# Patient Record
Sex: Female | Born: 1980 | Race: Black or African American | Hispanic: No | Marital: Married | Smoking: Never smoker
Health system: Southern US, Community
[De-identification: ages and names within clinical notes are randomized; demographics above are authoritative.]

## PROBLEM LIST (undated history)

## (undated) DIAGNOSIS — G4700 Insomnia, unspecified: Secondary | ICD-10-CM

## (undated) DIAGNOSIS — N390 Urinary tract infection, site not specified: Secondary | ICD-10-CM

## (undated) DIAGNOSIS — K219 Gastro-esophageal reflux disease without esophagitis: Secondary | ICD-10-CM

## (undated) DIAGNOSIS — L309 Dermatitis, unspecified: Secondary | ICD-10-CM

## (undated) DIAGNOSIS — M419 Scoliosis, unspecified: Secondary | ICD-10-CM

## (undated) DIAGNOSIS — E785 Hyperlipidemia, unspecified: Secondary | ICD-10-CM

## (undated) DIAGNOSIS — IMO0001 Reserved for inherently not codable concepts without codable children: Secondary | ICD-10-CM

## (undated) DIAGNOSIS — Z973 Presence of spectacles and contact lenses: Secondary | ICD-10-CM

## (undated) DIAGNOSIS — L509 Urticaria, unspecified: Secondary | ICD-10-CM

## (undated) DIAGNOSIS — N760 Acute vaginitis: Secondary | ICD-10-CM

## (undated) DIAGNOSIS — F988 Other specified behavioral and emotional disorders with onset usually occurring in childhood and adolescence: Secondary | ICD-10-CM

## (undated) DIAGNOSIS — Z464 Encounter for fitting and adjustment of orthodontic device: Secondary | ICD-10-CM

## (undated) DIAGNOSIS — B9689 Other specified bacterial agents as the cause of diseases classified elsewhere: Secondary | ICD-10-CM

## (undated) HISTORY — DX: Insomnia, unspecified: G47.00

## (undated) HISTORY — PX: CERVIX LESION DESTRUCTION: SHX591

## (undated) HISTORY — DX: Urticaria, unspecified: L50.9

## (undated) HISTORY — DX: Dermatitis, unspecified: L30.9

## (undated) HISTORY — DX: Urinary tract infection, site not specified: N39.0

## (undated) HISTORY — DX: Acute vaginitis: N76.0

## (undated) HISTORY — DX: Other specified bacterial agents as the cause of diseases classified elsewhere: B96.89

## (undated) HISTORY — DX: Hyperlipidemia, unspecified: E78.5

## (undated) HISTORY — PX: DILATION AND CURETTAGE OF UTERUS: SHX78

---

## 2000-10-11 ENCOUNTER — Emergency Department (HOSPITAL_COMMUNITY): Admission: EM | Admit: 2000-10-11 | Discharge: 2000-10-11 | Payer: Self-pay | Admitting: Emergency Medicine

## 2002-01-19 ENCOUNTER — Other Ambulatory Visit: Admission: RE | Admit: 2002-01-19 | Discharge: 2002-01-19 | Payer: Self-pay | Admitting: Obstetrics and Gynecology

## 2002-09-07 ENCOUNTER — Emergency Department (HOSPITAL_COMMUNITY): Admission: EM | Admit: 2002-09-07 | Discharge: 2002-09-07 | Payer: Self-pay | Admitting: Emergency Medicine

## 2002-09-08 ENCOUNTER — Encounter: Payer: Self-pay | Admitting: Emergency Medicine

## 2002-09-08 ENCOUNTER — Ambulatory Visit (HOSPITAL_COMMUNITY): Admission: RE | Admit: 2002-09-08 | Discharge: 2002-09-08 | Payer: Self-pay | Admitting: Emergency Medicine

## 2003-12-02 ENCOUNTER — Emergency Department (HOSPITAL_COMMUNITY): Admission: AD | Admit: 2003-12-02 | Discharge: 2003-12-02 | Payer: Self-pay | Admitting: Internal Medicine

## 2003-12-02 IMAGING — CR DG CHEST 2V
2 series · 2 of 2 positions shown · non-contrast
Comparison: none

CLINICAL DATA: Chest pain.  
 CHEST (TWO VIEWS)
 No comparison.  
 The heart size and mediastinal contours are normal. The lungs are clear. The visualized skeleton is unremarkable.

 IMPRESSION
 No active disease.

[view not recorded (1 of 2)]
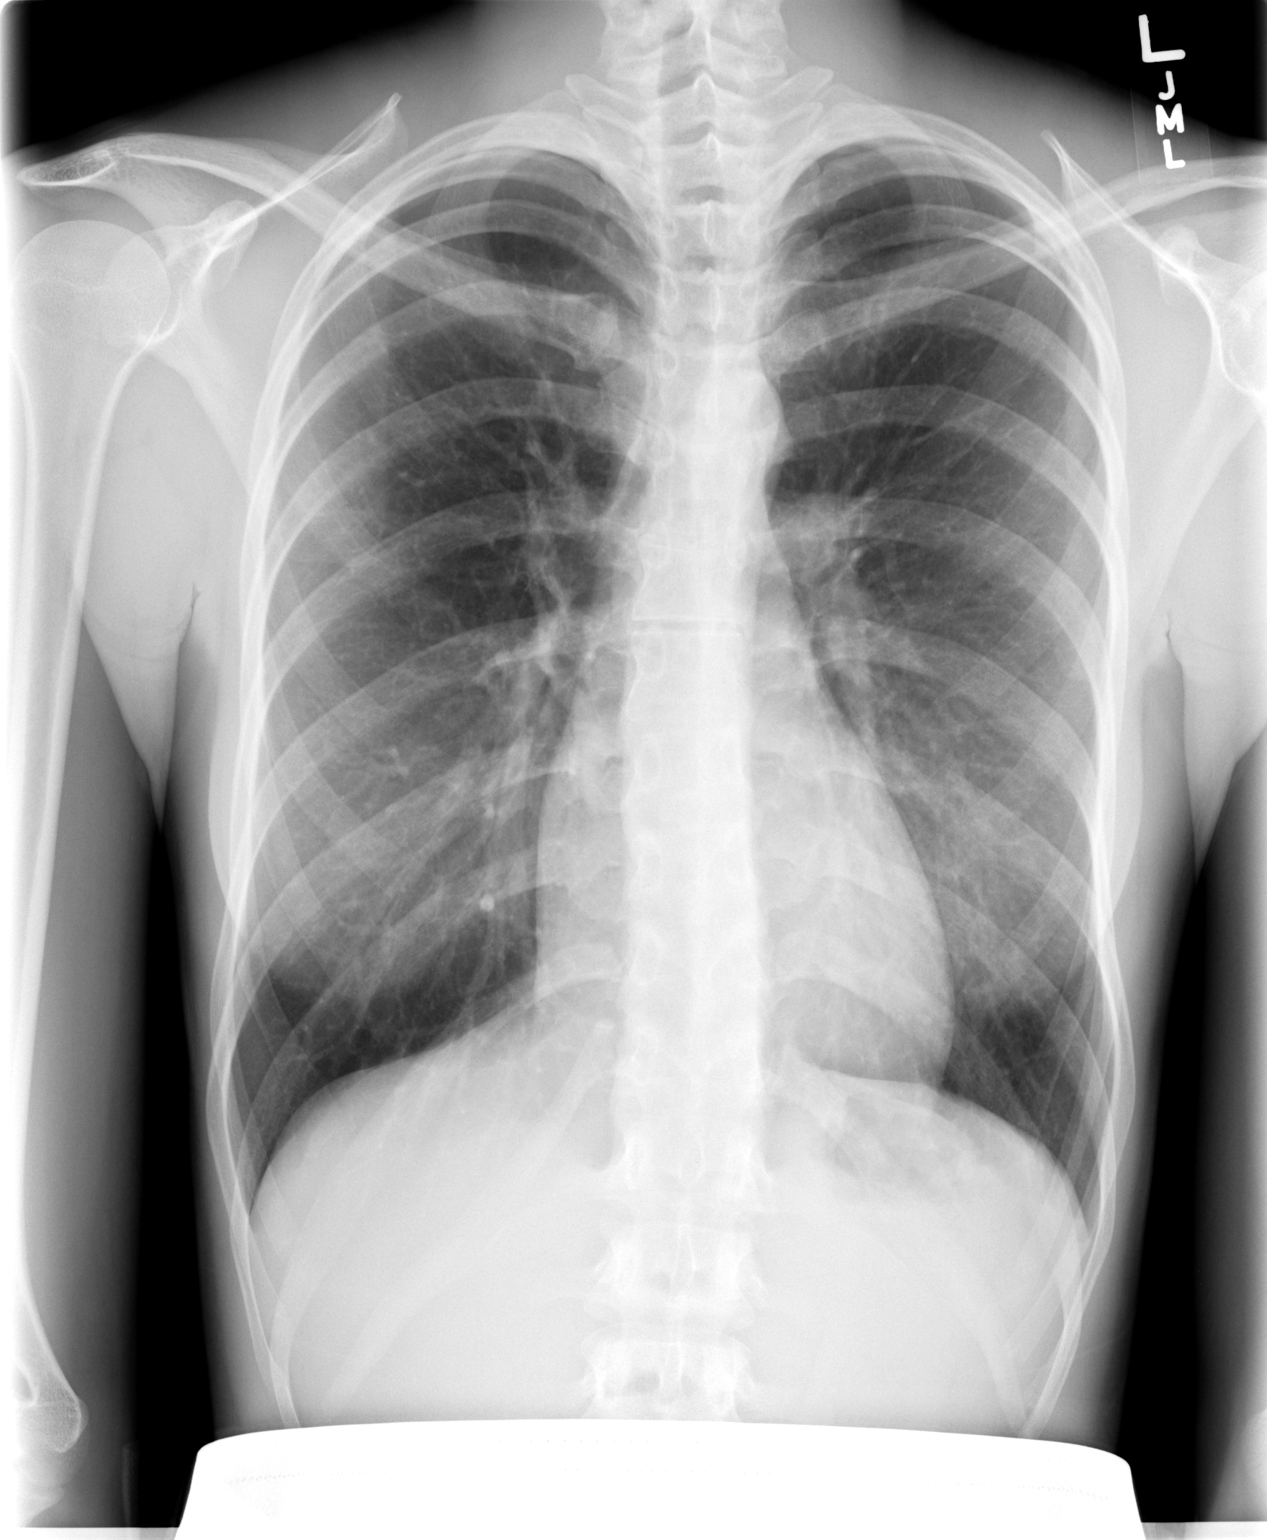

[view not recorded (2 of 2)]
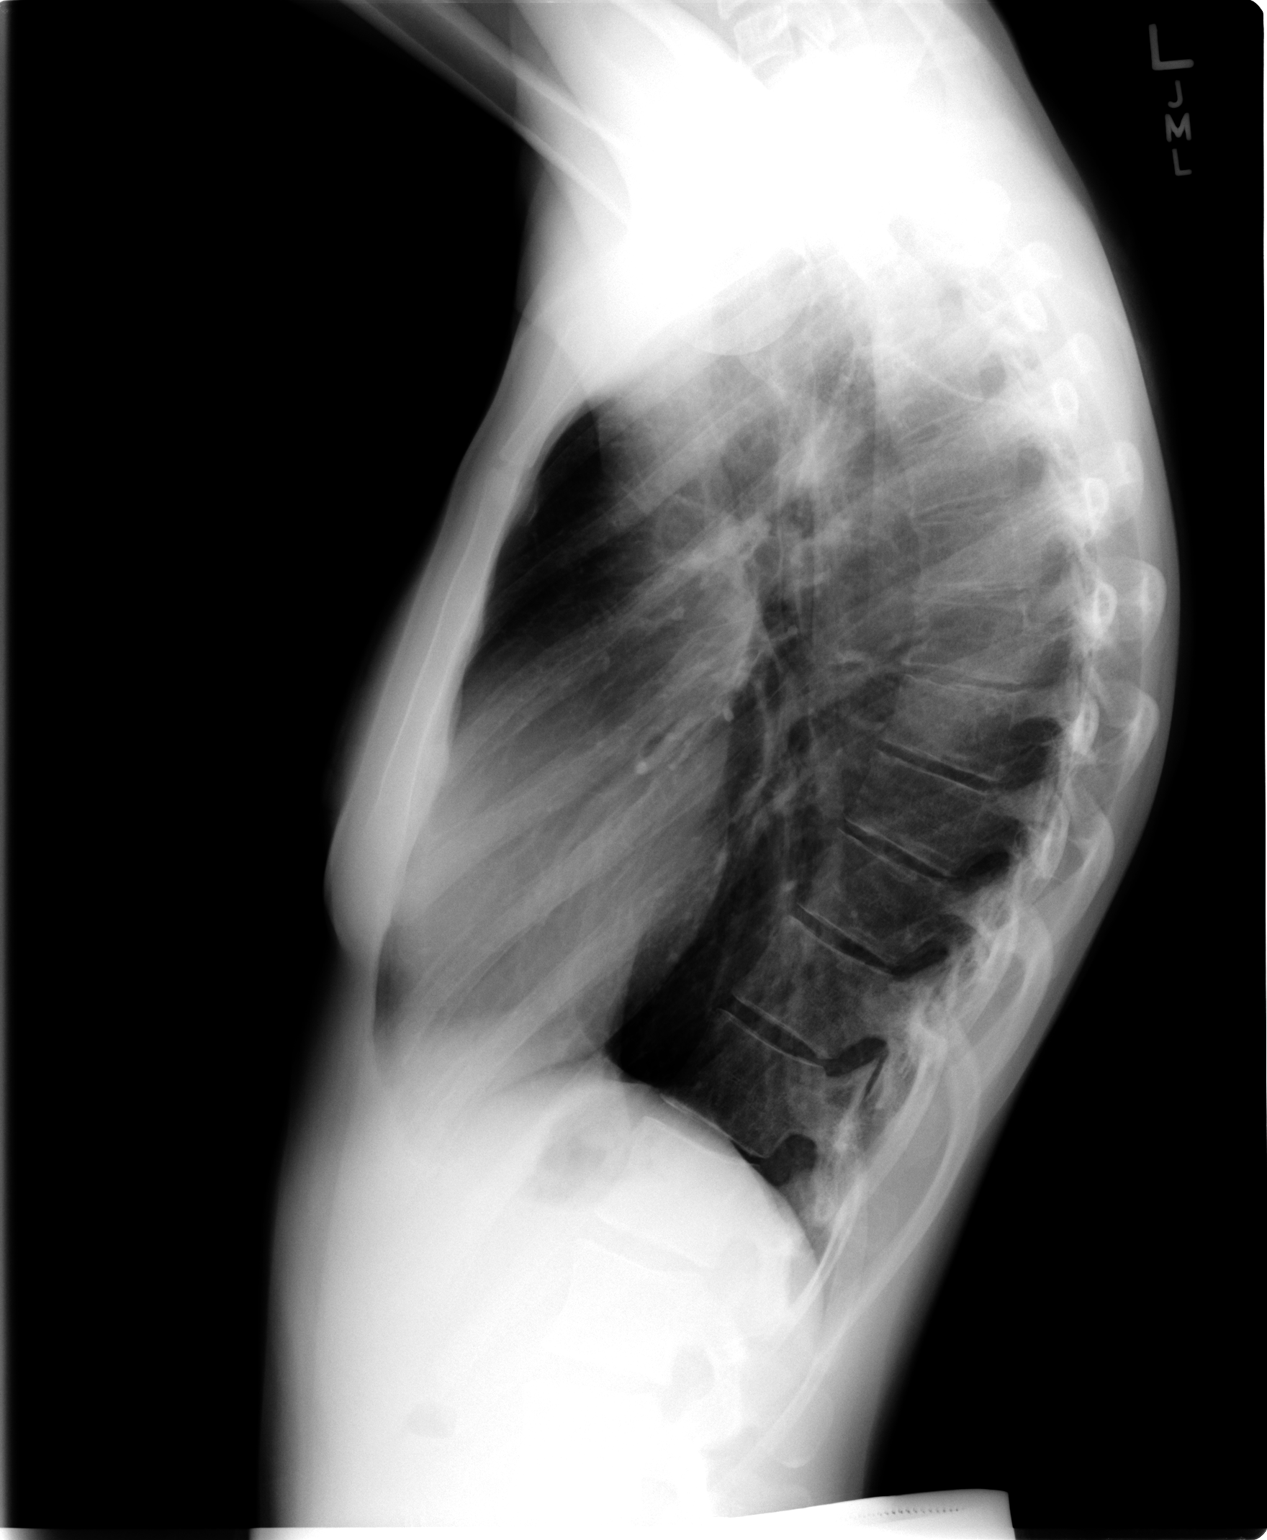

[2 of 2 positions shown; findings below may reference images not displayed]

## 2014-04-11 ENCOUNTER — Emergency Department: Payer: Self-pay | Admitting: Emergency Medicine

## 2014-04-11 LAB — CBC WITH DIFFERENTIAL/PLATELET
Comment - H1-Com3: NORMAL
HCT: 45.9 % (ref 35.0–47.0)
HGB: 14.5 g/dL (ref 12.0–16.0)
LYMPHS PCT: 20 %
MCH: 27.9 pg (ref 26.0–34.0)
MCHC: 31.6 g/dL — AB (ref 32.0–36.0)
MCV: 88 fL (ref 80–100)
Monocytes: 5 %
Platelet: 284 10*3/uL (ref 150–440)
RBC: 5.2 10*6/uL (ref 3.80–5.20)
RDW: 13.7 % (ref 11.5–14.5)
Segmented Neutrophils: 75 %
WBC: 7.5 10*3/uL (ref 3.6–11.0)

## 2014-04-11 LAB — URINALYSIS, COMPLETE
BILIRUBIN, UR: NEGATIVE
Glucose,UR: NEGATIVE mg/dL (ref 0–75)
KETONE: NEGATIVE
LEUKOCYTE ESTERASE: NEGATIVE
NITRITE: NEGATIVE
Ph: 6 (ref 4.5–8.0)
Protein: 30
Specific Gravity: 1.028 (ref 1.003–1.030)
Squamous Epithelial: 5

## 2014-04-11 LAB — COMPREHENSIVE METABOLIC PANEL
ALT: 37 U/L
ANION GAP: 9 (ref 7–16)
Albumin: 3.5 g/dL (ref 3.4–5.0)
Alkaline Phosphatase: 61 U/L
BUN: 10 mg/dL (ref 7–18)
Bilirubin,Total: 0.8 mg/dL (ref 0.2–1.0)
Calcium, Total: 8.7 mg/dL (ref 8.5–10.1)
Chloride: 103 mmol/L (ref 98–107)
Co2: 24 mmol/L (ref 21–32)
Creatinine: 0.66 mg/dL (ref 0.60–1.30)
EGFR (African American): >60
EGFR (Non-African Amer.): >60
Glucose: 88 mg/dL (ref 65–99)
Osmolality: 270 (ref 275–301)
Potassium: 4.4 mmol/L (ref 3.5–5.1)
SGOT(AST): 55 U/L — ABNORMAL HIGH (ref 15–37)
Sodium: 136 mmol/L (ref 136–145)
Total Protein: 8.8 g/dL — ABNORMAL HIGH (ref 6.4–8.2)

## 2014-04-11 LAB — LIPASE, BLOOD: Lipase: 148 U/L (ref 73–393)

## 2014-04-11 IMAGING — CT CT ABD-PELV W/ CM
2 of 4 series · 16 of 46 positions shown, 18 images · IV contrast (isovue)
Comparison: Ultrasound [DATE]

CLINICAL DATA: Upper abdominal pain diarrhea, nausea

EXAM:
CT ABDOMEN AND PELVIS WITH CONTRAST
TECHNIQUE: Multidetector CT imaging of the abdomen and pelvis was performed
using the standard protocol following bolus administration of
intravenous contrast.
CONTRAST:  85 cc Isovue

[Series 2: routine abd pel with · axial · 0.57mm/px · z∈[-850,-460]mm · 13 of 86 slices shown, 15 images]
[im 4/86  soft-tissue]
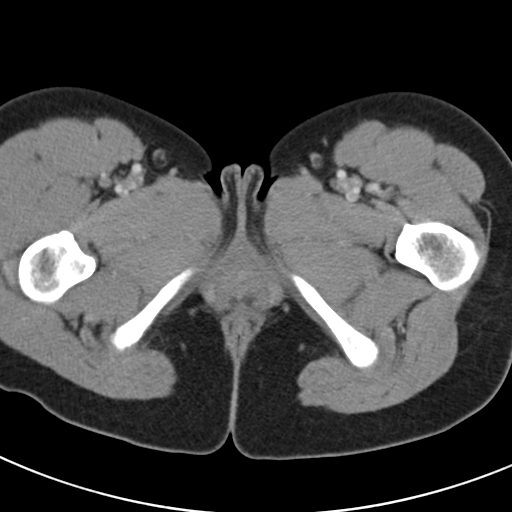
[im 4/86  bone]
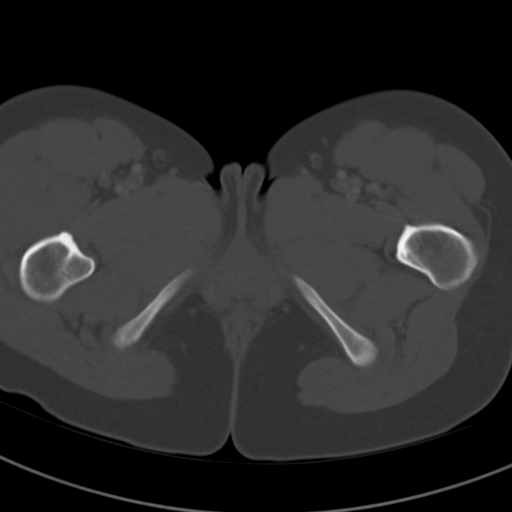
[im 11/86  soft-tissue]
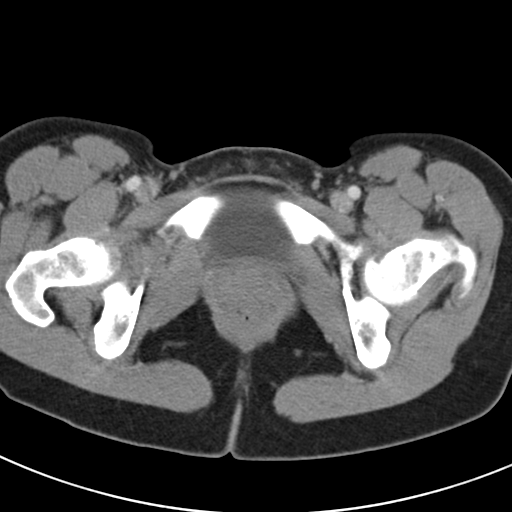
[im 18/86  soft-tissue]
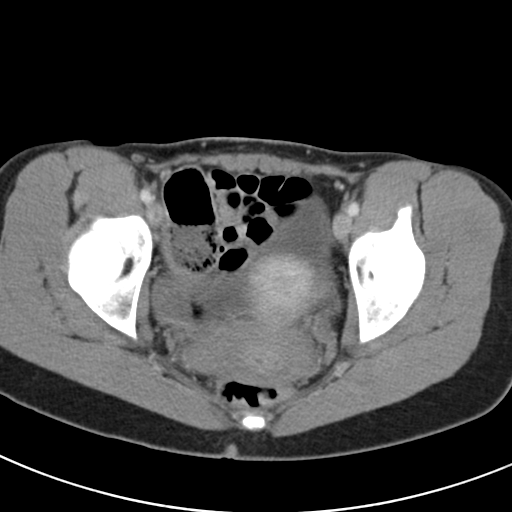
[im 24/86  soft-tissue]
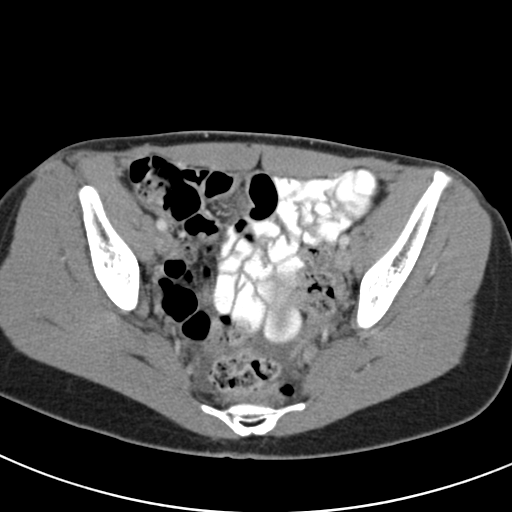
[im 31/86  soft-tissue]
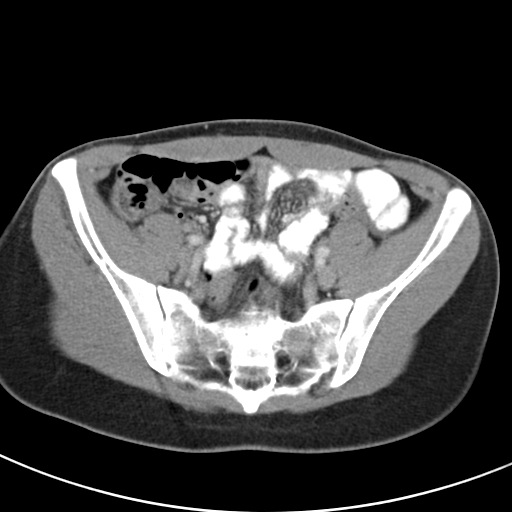
[im 38/86  soft-tissue]
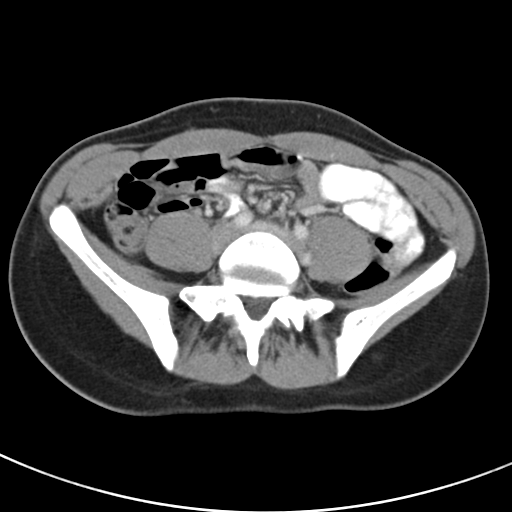
[im 45/86  soft-tissue]
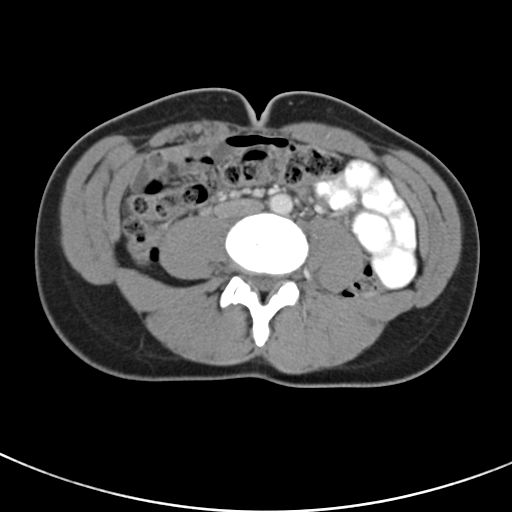
[im 48/86  soft-tissue]
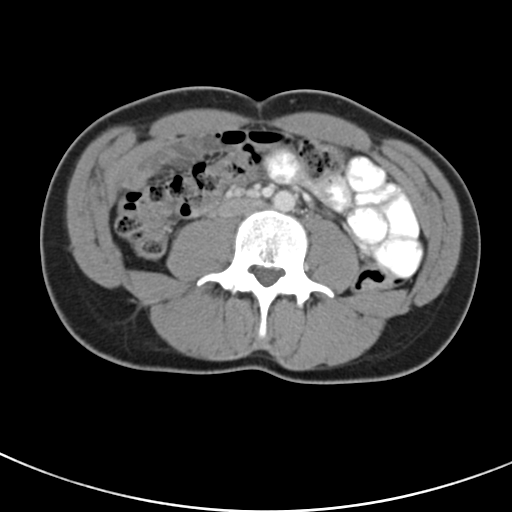
[im 55/86  soft-tissue]
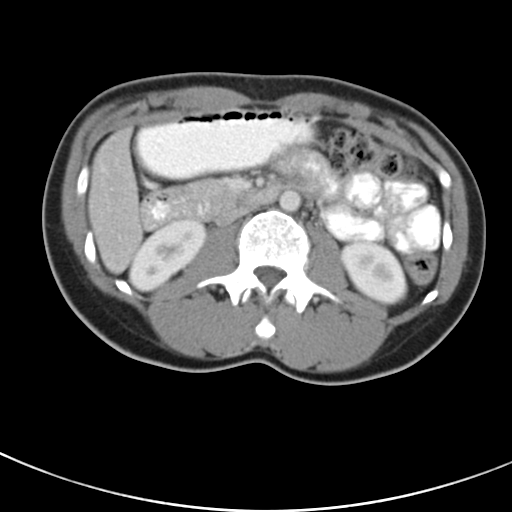
[im 55/86  bone]
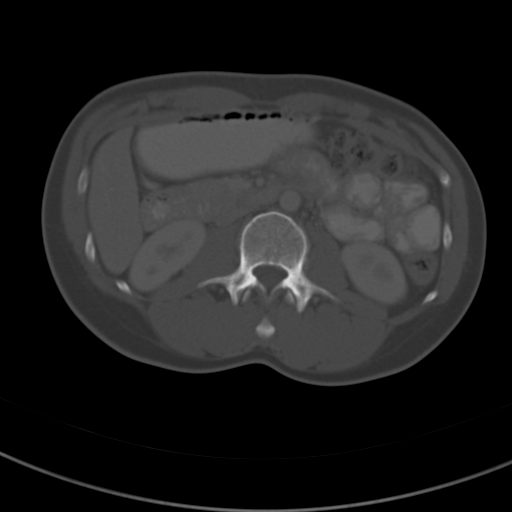
[im 62/86  soft-tissue]
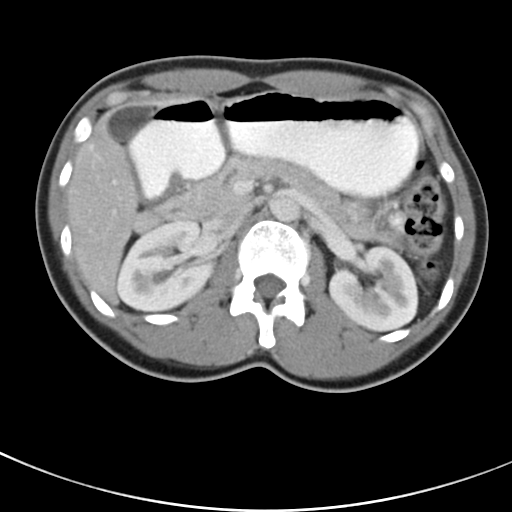
[im 69/86  soft-tissue]
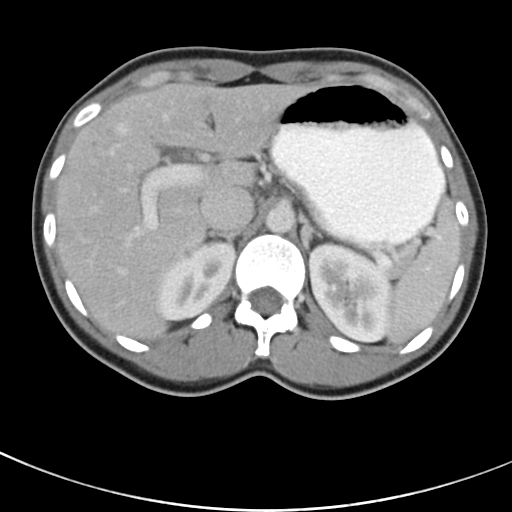
[im 75/86  soft-tissue]
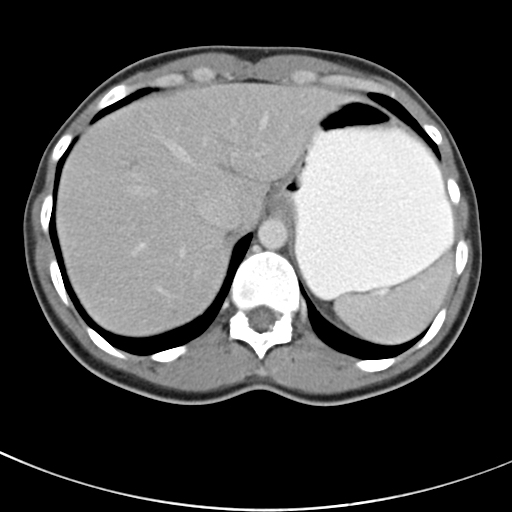
[im 82/86  soft-tissue]
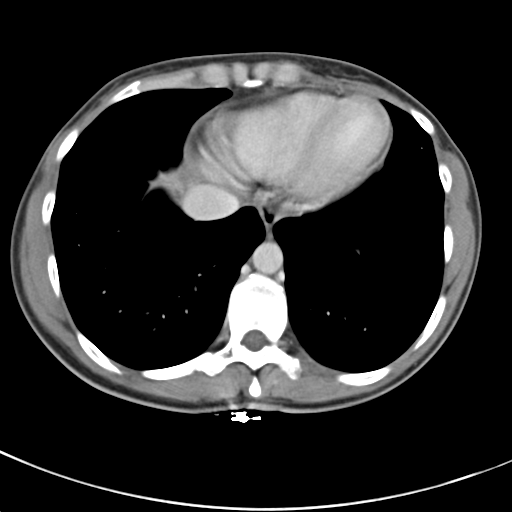

[Series 5: cor routine abd pel with · coronal · 0.62mm/px · 3 of 126 slices shown]
[im 42/126  soft-tissue]
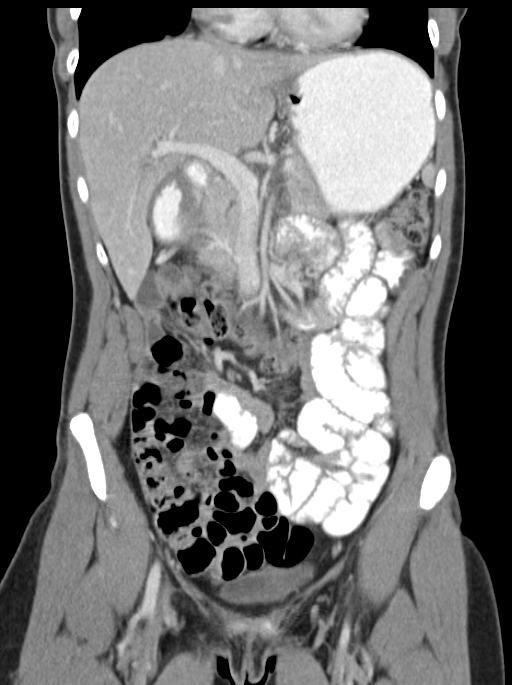
[im 56/126  soft-tissue]
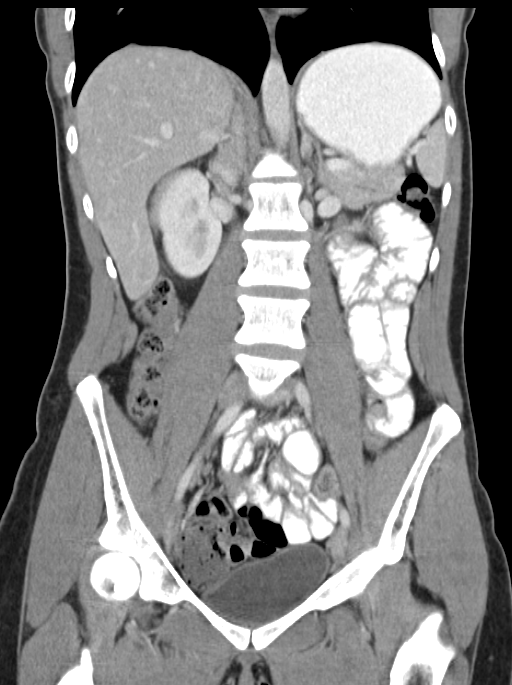
[im 70/126  soft-tissue]
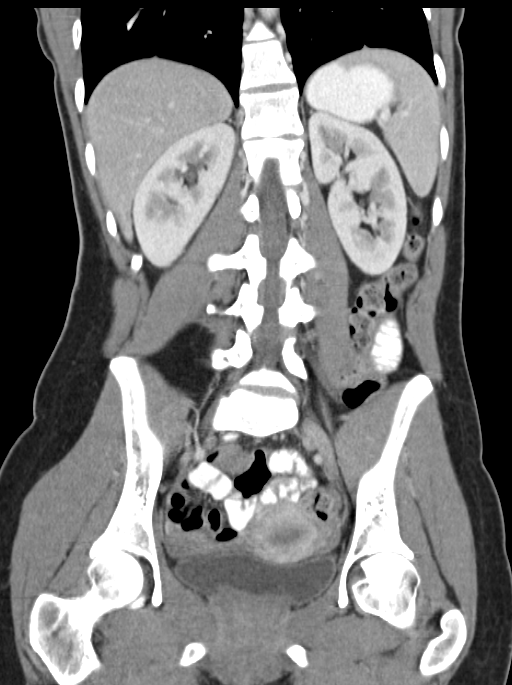

[16 of 46 positions shown; findings below may reference images not displayed]

FINDINGS: Lung bases are unremarkable. Sagittal images of the spine are
unremarkable.

Liver, pancreas, spleen and adrenals are unremarkable. Abdominal
aorta is unremarkable. No aortic aneurysm. No small bowel
obstruction. Normal appendix. No pericecal inflammation. Moderate
colonic stool.

No free abdominal air. No adenopathy. Small free fluid noted within
pelvis. No adnexal mass is noted. The urinary bladder is
unremarkable. No inguinal adenopathy.

There is no small bowel obstruction.

Kidneys are symmetrical in size and enhancement. No hydronephrosis
or hydroureter.
IMPRESSION: 1. No small bowel obstruction.
2. Normal appendix.  No pericecal inflammation.
3. No hydronephrosis or hydroureter.
4. Small amount of pelvic free fluid.  No adnexal mass.

## 2014-04-19 ENCOUNTER — Ambulatory Visit: Payer: Self-pay | Admitting: Urgent Care

## 2014-04-19 IMAGING — NM NUCLEAR MEDICINE HEPATOHBILIARY INCLUDE GB
3 series · 21 of 21 positions shown · non-contrast
Comparison: None.

CLINICAL DATA: Severe epigastric pain with nausea and vomiting.

EXAM:
NUCLEAR MEDICINE HEPATOBILIARY IMAGING WITH GALLBLADDER EF
TECHNIQUE: Sequential images of the abdomen were obtained [DATE] minutes
following intravenous administration of radiopharmaceutical. After
slow intravenous infusion of 1.1 micrograms Cholecystokinin,
gallbladder ejection fraction was determined.
RADIOPHARMACEUTICALS:  8.0 Millicurie [72] Choletec

[Series 1000: gallbladder dynamic (results) · 4.80mm/px · 6 of 120 frames shown]
[frame 11/120]
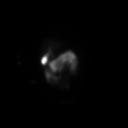
[frame 31/120]
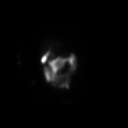
[frame 51/120]
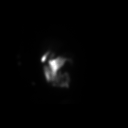
[frame 71/120]
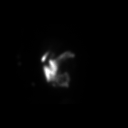
[frame 91/120]
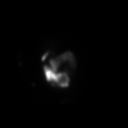
[frame 111/120]
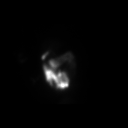

[Series 1000: gallbladder statics · 4.80mm/px · 9 of 9 slices shown]
[im 1/9]
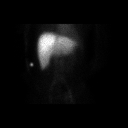
[im 2/9]
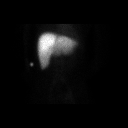
[im 3/9]
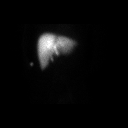
[im 4/9]
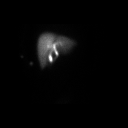
[im 5/9]
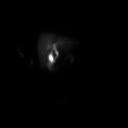
[im 6/9]
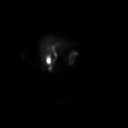
[im 7/9]
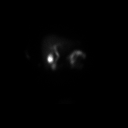
[im 8/9]
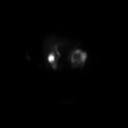
[im 9/9]
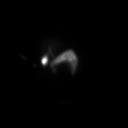

[Series 1000: gallbladder dynamic · 4.80mm/px · 6 of 120 frames shown]
[frame 11/120]
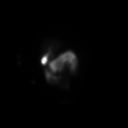
[frame 31/120]
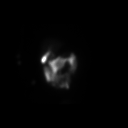
[frame 51/120]
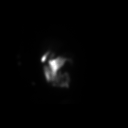
[frame 71/120]
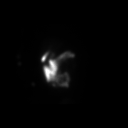
[frame 91/120]
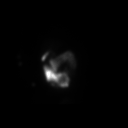
[frame 111/120]
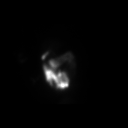

[21 of 21 positions shown; findings below may reference images not displayed]

FINDINGS: There is brisk uptake of radiotracer by the hepatic parenchyma.
Blood pool activity is cleared by 05-[72] min. Biliary activity is
seen at 10 min and gallbladder filling started 10 min. Gut activity
is visible by 20 min.

After administration of CCK, gallbladder ejection fraction is
calculated at 82%. The patient did experience abdominal cramping and
pain after administration of CCK which increased from "3" up to
about a "6" on a 0-10 pain scale.. At 30 min, normal ejection
fraction is greater than 30%.
IMPRESSION: Normal hepatobiliary patency study with normal gallbladder ejection
fraction. The patient did experience symptoms after administration
of intravenous CCK.

## 2014-05-09 ENCOUNTER — Ambulatory Visit: Payer: Self-pay | Admitting: Psychology

## 2014-05-30 ENCOUNTER — Ambulatory Visit: Payer: Self-pay | Admitting: Psychology

## 2014-06-29 ENCOUNTER — Ambulatory Visit (INDEPENDENT_AMBULATORY_CARE_PROVIDER_SITE_OTHER): Payer: 59 | Admitting: Psychology

## 2014-06-29 DIAGNOSIS — F4323 Adjustment disorder with mixed anxiety and depressed mood: Secondary | ICD-10-CM

## 2014-07-05 ENCOUNTER — Ambulatory Visit: Payer: 59 | Admitting: Psychology

## 2014-07-26 ENCOUNTER — Ambulatory Visit (INDEPENDENT_AMBULATORY_CARE_PROVIDER_SITE_OTHER): Payer: BC Managed Care – PPO | Admitting: Psychology

## 2014-07-26 DIAGNOSIS — F4323 Adjustment disorder with mixed anxiety and depressed mood: Secondary | ICD-10-CM

## 2014-08-22 ENCOUNTER — Encounter: Payer: Self-pay | Admitting: Family Medicine

## 2014-08-22 ENCOUNTER — Ambulatory Visit (INDEPENDENT_AMBULATORY_CARE_PROVIDER_SITE_OTHER): Payer: BC Managed Care – PPO | Admitting: Family Medicine

## 2014-08-22 VITALS — BP 122/87 | HR 80 | Ht 67.0 in | Wt 124.8 lb

## 2014-08-22 DIAGNOSIS — Z113 Encounter for screening for infections with a predominantly sexual mode of transmission: Secondary | ICD-10-CM | POA: Diagnosis not present

## 2014-08-22 DIAGNOSIS — Z124 Encounter for screening for malignant neoplasm of cervix: Secondary | ICD-10-CM | POA: Diagnosis not present

## 2014-08-22 DIAGNOSIS — Z1151 Encounter for screening for human papillomavirus (HPV): Secondary | ICD-10-CM

## 2014-08-22 DIAGNOSIS — N898 Other specified noninflammatory disorders of vagina: Secondary | ICD-10-CM

## 2014-08-22 DIAGNOSIS — Z01419 Encounter for gynecological examination (general) (routine) without abnormal findings: Secondary | ICD-10-CM | POA: Diagnosis not present

## 2014-08-22 DIAGNOSIS — Z30011 Encounter for initial prescription of contraceptive pills: Secondary | ICD-10-CM | POA: Diagnosis not present

## 2014-08-22 MED ORDER — ETONOGESTREL-ETHINYL ESTRADIOL 0.12-0.015 MG/24HR VA RING: VAGINAL_RING | VAGINAL | Status: DC

## 2014-08-22 NOTE — Patient Instructions (Addendum)
Preventive Care for Adults A healthy lifestyle and preventive care can promote health and wellness. Preventive health guidelines for women include the following key practices.  A routine yearly physical is a good way to check with your health care provider about your health and preventive screening. It is a chance to share any concerns and updates on your health and to receive a thorough exam.  Visit your dentist for a routine exam and preventive care every 6 months. Brush your teeth twice a day and floss once a day. Good oral hygiene prevents tooth decay and gum disease.  The frequency of eye exams is based on your age, health, family medical history, use of contact lenses, and other factors. Follow your health care provider's recommendations for frequency of eye exams.  Eat a healthy diet. Foods like vegetables, fruits, whole grains, low-fat dairy products, and lean protein foods contain the nutrients you need without too many calories. Decrease your intake of foods high in solid fats, added sugars, and salt. Eat the right amount of calories for you.Get information about a proper diet from your health care provider, if necessary.  Regular physical exercise is one of the most important things you can do for your health. Most adults should get at least 150 minutes of moderate-intensity exercise (any activity that increases your heart rate and causes you to sweat) each week. In addition, most adults need muscle-strengthening exercises on 2 or more days a week.  Maintain a healthy weight. The body mass index (BMI) is a screening tool to identify possible weight problems. It provides an estimate of body fat based on height and weight. Your health care provider can find your BMI and can help you achieve or maintain a healthy weight.For adults 20 years and older:  A BMI below 18.5 is considered underweight.  A BMI of 18.5 to 24.9 is normal.  A BMI of 25 to 29.9 is considered overweight.  A BMI of  30 and above is considered obese.  Maintain normal blood lipids and cholesterol levels by exercising and minimizing your intake of saturated fat. Eat a balanced diet with plenty of fruit and vegetables. Blood tests for lipids and cholesterol should begin at age 76 and be repeated every 5 years. If your lipid or cholesterol levels are high, you are over 50, or you are at high risk for heart disease, you may need your cholesterol levels checked more frequently.Ongoing high lipid and cholesterol levels should be treated with medicines if diet and exercise are not working.  If you smoke, find out from your health care provider how to quit. If you do not use tobacco, do not start.  Lung cancer screening is recommended for adults aged 22-80 years who are at high risk for developing lung cancer because of a history of smoking. A yearly low-dose CT scan of the lungs is recommended for people who have at least a 30-pack-year history of smoking and are a current smoker or have quit within the past 15 years. A pack year of smoking is smoking an average of 1 pack of cigarettes a day for 1 year (for example: 1 pack a day for 30 years or 2 packs a day for 15 years). Yearly screening should continue until the smoker has stopped smoking for at least 15 years. Yearly screening should be stopped for people who develop a health problem that would prevent them from having lung cancer treatment.  If you are pregnant, do not drink alcohol. If you are breastfeeding,  be very cautious about drinking alcohol. If you are not pregnant and choose to drink alcohol, do not have more than 1 drink per day. One drink is considered to be 12 ounces (355 mL) of beer, 5 ounces (148 mL) of wine, or 1.5 ounces (44 mL) of liquor.  Avoid use of street drugs. Do not share needles with anyone. Ask for help if you need support or instructions about stopping the use of drugs.  High blood pressure causes heart disease and increases the risk of  stroke. Your blood pressure should be checked at least every 1 to 2 years. Ongoing high blood pressure should be treated with medicines if weight loss and exercise do not work.  If you are 3-86 years old, ask your health care provider if you should take aspirin to prevent strokes.  Diabetes screening involves taking a blood sample to check your fasting blood sugar level. This should be done once every 3 years, after age 67, if you are within normal weight and without risk factors for diabetes. Testing should be considered at a younger age or be carried out more frequently if you are overweight and have at least 1 risk factor for diabetes.  Breast cancer screening is essential preventive care for women. You should practice "breast self-awareness." This means understanding the normal appearance and feel of your breasts and may include breast self-examination. Any changes detected, no matter how small, should be reported to a health care provider. Women in their 8s and 30s should have a clinical breast exam (CBE) by a health care provider as part of a regular health exam every 1 to 3 years. After age 70, women should have a CBE every year. Starting at age 25, women should consider having a mammogram (breast X-ray test) every year. Women who have a family history of breast cancer should talk to their health care provider about genetic screening. Women at a high risk of breast cancer should talk to their health care providers about having an MRI and a mammogram every year.  Breast cancer gene (BRCA)-related cancer risk assessment is recommended for women who have family members with BRCA-related cancers. BRCA-related cancers include breast, ovarian, tubal, and peritoneal cancers. Having family members with these cancers may be associated with an increased risk for harmful changes (mutations) in the breast cancer genes BRCA1 and BRCA2. Results of the assessment will determine the need for genetic counseling and  BRCA1 and BRCA2 testing.  Routine pelvic exams to screen for cancer are no longer recommended for nonpregnant women who are considered low risk for cancer of the pelvic organs (ovaries, uterus, and vagina) and who do not have symptoms. Ask your health care provider if a screening pelvic exam is right for you.  If you have had past treatment for cervical cancer or a condition that could lead to cancer, you need Pap tests and screening for cancer for at least 20 years after your treatment. If Pap tests have been discontinued, your risk factors (such as having a new sexual partner) need to be reassessed to determine if screening should be resumed. Some women have medical problems that increase the chance of getting cervical cancer. In these cases, your health care provider may recommend more frequent screening and Pap tests.  The HPV test is an additional test that may be used for cervical cancer screening. The HPV test looks for the virus that can cause the cell changes on the cervix. The cells collected during the Pap test can be  tested for HPV. The HPV test could be used to screen women aged 30 years and older, and should be used in women of any age who have unclear Pap test results. After the age of 30, women should have HPV testing at the same frequency as a Pap test.  Colorectal cancer can be detected and often prevented. Most routine colorectal cancer screening begins at the age of 50 years and continues through age 75 years. However, your health care provider may recommend screening at an earlier age if you have risk factors for colon cancer. On a yearly basis, your health care provider may provide home test kits to check for hidden blood in the stool. Use of a small camera at the end of a tube, to directly examine the colon (sigmoidoscopy or colonoscopy), can detect the earliest forms of colorectal cancer. Talk to your health care provider about this at age 50, when routine screening begins. Direct  exam of the colon should be repeated every 5-10 years through age 75 years, unless early forms of pre-cancerous polyps or small growths are found.  People who are at an increased risk for hepatitis B should be screened for this virus. You are considered at high risk for hepatitis B if:  You were born in a country where hepatitis B occurs often. Talk with your health care provider about which countries are considered high risk.  Your parents were born in a high-risk country and you have not received a shot to protect against hepatitis B (hepatitis B vaccine).  You have HIV or AIDS.  You use needles to inject street drugs.  You live with, or have sex with, someone who has hepatitis B.  You get hemodialysis treatment.  You take certain medicines for conditions like cancer, organ transplantation, and autoimmune conditions.  Hepatitis C blood testing is recommended for all people born from 1945 through 1965 and any individual with known risks for hepatitis C.  Practice safe sex. Use condoms and avoid high-risk sexual practices to reduce the spread of sexually transmitted infections (STIs). STIs include gonorrhea, chlamydia, syphilis, trichomonas, herpes, HPV, and human immunodeficiency virus (HIV). Herpes, HIV, and HPV are viral illnesses that have no cure. They can result in disability, cancer, and death.  You should be screened for sexually transmitted illnesses (STIs) including gonorrhea and chlamydia if:  You are sexually active and are younger than 24 years.  You are older than 24 years and your health care provider tells you that you are at risk for this type of infection.  Your sexual activity has changed since you were last screened and you are at an increased risk for chlamydia or gonorrhea. Ask your health care provider if you are at risk.  If you are at risk of being infected with HIV, it is recommended that you take a prescription medicine daily to prevent HIV infection. This is  called preexposure prophylaxis (PrEP). You are considered at risk if:  You are a heterosexual woman, are sexually active, and are at increased risk for HIV infection.  You take drugs by injection.  You are sexually active with a partner who has HIV.  Talk with your health care provider about whether you are at high risk of being infected with HIV. If you choose to begin PrEP, you should first be tested for HIV. You should then be tested every 3 months for as long as you are taking PrEP.  Osteoporosis is a disease in which the bones lose minerals and strength   with aging. This can result in serious bone fractures or breaks. The risk of osteoporosis can be identified using a bone density scan. Women ages 65 years and over and women at risk for fractures or osteoporosis should discuss screening with their health care providers. Ask your health care provider whether you should take a calcium supplement or vitamin D to reduce the rate of osteoporosis.  Menopause can be associated with physical symptoms and risks. Hormone replacement therapy is available to decrease symptoms and risks. You should talk to your health care provider about whether hormone replacement therapy is right for you.  Use sunscreen. Apply sunscreen liberally and repeatedly throughout the day. You should seek shade when your shadow is shorter than you. Protect yourself by wearing long sleeves, pants, a wide-brimmed hat, and sunglasses year round, whenever you are outdoors.  Once a month, do a whole body skin exam, using a mirror to look at the skin on your back. Tell your health care provider of new moles, moles that have irregular borders, moles that are larger than a pencil eraser, or moles that have changed in shape or color.  Stay current with required vaccines (immunizations).  Influenza vaccine. All adults should be immunized every year.  Tetanus, diphtheria, and acellular pertussis (Td, Tdap) vaccine. Pregnant women should  receive 1 dose of Tdap vaccine during each pregnancy. The dose should be obtained regardless of the length of time since the last dose. Immunization is preferred during the 27th-36th week of gestation. An adult who has not previously received Tdap or who does not know her vaccine status should receive 1 dose of Tdap. This initial dose should be followed by tetanus and diphtheria toxoids (Td) booster doses every 10 years. Adults with an unknown or incomplete history of completing a 3-dose immunization series with Td-containing vaccines should begin or complete a primary immunization series including a Tdap dose. Adults should receive a Td booster every 10 years.  Varicella vaccine. An adult without evidence of immunity to varicella should receive 2 doses or a second dose if she has previously received 1 dose. Pregnant females who do not have evidence of immunity should receive the first dose after pregnancy. This first dose should be obtained before leaving the health care facility. The second dose should be obtained 4-8 weeks after the first dose.  Human papillomavirus (HPV) vaccine. Females aged 13-26 years who have not received the vaccine previously should obtain the 3-dose series. The vaccine is not recommended for use in pregnant females. However, pregnancy testing is not needed before receiving a dose. If a female is found to be pregnant after receiving a dose, no treatment is needed. In that case, the remaining doses should be delayed until after the pregnancy. Immunization is recommended for any person with an immunocompromised condition through the age of 26 years if she did not get any or all doses earlier. During the 3-dose series, the second dose should be obtained 4-8 weeks after the first dose. The third dose should be obtained 24 weeks after the first dose and 16 weeks after the second dose.  Zoster vaccine. One dose is recommended for adults aged 60 years or older unless certain conditions are  present.  Measles, mumps, and rubella (MMR) vaccine. Adults born before 1957 generally are considered immune to measles and mumps. Adults born in 1957 or later should have 1 or more doses of MMR vaccine unless there is a contraindication to the vaccine or there is laboratory evidence of immunity to   each of the three diseases. A routine second dose of MMR vaccine should be obtained at least 28 days after the first dose for students attending postsecondary schools, health care workers, or international travelers. People who received inactivated measles vaccine or an unknown type of measles vaccine during 1963-1967 should receive 2 doses of MMR vaccine. People who received inactivated mumps vaccine or an unknown type of mumps vaccine before 1979 and are at high risk for mumps infection should consider immunization with 2 doses of MMR vaccine. For females of childbearing age, rubella immunity should be determined. If there is no evidence of immunity, females who are not pregnant should be vaccinated. If there is no evidence of immunity, females who are pregnant should delay immunization until after pregnancy. Unvaccinated health care workers born before 1957 who lack laboratory evidence of measles, mumps, or rubella immunity or laboratory confirmation of disease should consider measles and mumps immunization with 2 doses of MMR vaccine or rubella immunization with 1 dose of MMR vaccine.  Pneumococcal 13-valent conjugate (PCV13) vaccine. When indicated, a person who is uncertain of her immunization history and has no record of immunization should receive the PCV13 vaccine. An adult aged 19 years or older who has certain medical conditions and has not been previously immunized should receive 1 dose of PCV13 vaccine. This PCV13 should be followed with a dose of pneumococcal polysaccharide (PPSV23) vaccine. The PPSV23 vaccine dose should be obtained at least 8 weeks after the dose of PCV13 vaccine. An adult aged 19  years or older who has certain medical conditions and previously received 1 or more doses of PPSV23 vaccine should receive 1 dose of PCV13. The PCV13 vaccine dose should be obtained 1 or more years after the last PPSV23 vaccine dose.  Pneumococcal polysaccharide (PPSV23) vaccine. When PCV13 is also indicated, PCV13 should be obtained first. All adults aged 65 years and older should be immunized. An adult younger than age 65 years who has certain medical conditions should be immunized. Any person who resides in a nursing home or long-term care facility should be immunized. An adult smoker should be immunized. People with an immunocompromised condition and certain other conditions should receive both PCV13 and PPSV23 vaccines. People with human immunodeficiency virus (HIV) infection should be immunized as soon as possible after diagnosis. Immunization during chemotherapy or radiation therapy should be avoided. Routine use of PPSV23 vaccine is not recommended for American Indians, [place] Natives, or people younger than 65 years unless there are medical conditions that require PPSV23 vaccine. When indicated, people who have unknown immunization and have no record of immunization should receive PPSV23 vaccine. One-time revaccination 5 years after the first dose of PPSV23 is recommended for people aged 19-64 years who have chronic kidney failure, nephrotic syndrome, asplenia, or immunocompromised conditions. People who received 1-2 doses of PPSV23 before age 65 years should receive another dose of PPSV23 vaccine at age 65 years or later if at least 5 years have passed since the previous dose. Doses of PPSV23 are not needed for people immunized with PPSV23 at or after age 65 years.  Meningococcal vaccine. Adults with asplenia or persistent complement component deficiencies should receive 2 doses of quadrivalent meningococcal conjugate (MenACWY-D) vaccine. The doses should be obtained at least 2 months apart.  Microbiologists working with certain meningococcal bacteria, military recruits, people at risk during an outbreak, and people who travel to or live in countries with a high rate of meningitis should be immunized. A first-year college student up through age   21 years who is living in a residence hall should receive a dose if she did not receive a dose on or after her 16th birthday. Adults who have certain high-risk conditions should receive one or more doses of vaccine.  Hepatitis A vaccine. Adults who wish to be protected from this disease, have certain high-risk conditions, work with hepatitis A-infected animals, work in hepatitis A research labs, or travel to or work in countries with a high rate of hepatitis A should be immunized. Adults who were previously unvaccinated and who anticipate close contact with an international adoptee during the first 60 days after arrival in the [place] from a country with a high rate of hepatitis A should be immunized.  Hepatitis B vaccine. Adults who wish to be protected from this disease, have certain high-risk conditions, may be exposed to blood or other infectious body fluids, are household contacts or sex partners of hepatitis B positive people, are clients or workers in certain care facilities, or travel to or work in countries with a high rate of hepatitis B should be immunized.  Haemophilus influenzae type b (Hib) vaccine. A previously unvaccinated person with asplenia or sickle cell disease or having a scheduled splenectomy should receive 1 dose of Hib vaccine. Regardless of previous immunization, a recipient of a hematopoietic stem cell transplant should receive a 3-dose series 6-12 months after her successful transplant. Hib vaccine is not recommended for adults with HIV infection. Preventive Services / Frequency Ages 64 to 68 years  Blood pressure check.** / Every 1 to 2 years.  Lipid and cholesterol check.** / Every 5 years beginning at age  22.  Clinical breast exam.** / Every 3 years for women in their 88s and 53s.  BRCA-related cancer risk assessment.** / For women who have family members with a BRCA-related cancer (breast, ovarian, tubal, or peritoneal cancers).  Pap test.** / Every 2 years from ages 90 through 51. Every 3 years starting at age 21 through age 56 or 3 with a history of 3 consecutive normal Pap tests.  HPV screening.** / Every 3 years from ages 24 through ages 1 to 46 with a history of 3 consecutive normal Pap tests.  Hepatitis C blood test.** / For any individual with known risks for hepatitis C.  Skin self-exam. / Monthly.  Influenza vaccine. / Every year.  Tetanus, diphtheria, and acellular pertussis (Tdap, Td) vaccine.** / Consult your health care provider. Pregnant women should receive 1 dose of Tdap vaccine during each pregnancy. 1 dose of Td every 10 years.  Varicella vaccine.** / Consult your health care provider. Pregnant females who do not have evidence of immunity should receive the first dose after pregnancy.  HPV vaccine. / 3 doses over 6 months, if 72 and younger. The vaccine is not recommended for use in pregnant females. However, pregnancy testing is not needed before receiving a dose.  Measles, mumps, rubella (MMR) vaccine.** / You need at least 1 dose of MMR if you were born in 1957 or later. You may also need a 2nd dose. For females of childbearing age, rubella immunity should be determined. If there is no evidence of immunity, females who are not pregnant should be vaccinated. If there is no evidence of immunity, females who are pregnant should delay immunization until after pregnancy.  Pneumococcal 13-valent conjugate (PCV13) vaccine.** / Consult your health care provider.  Pneumococcal polysaccharide (PPSV23) vaccine.** / 1 to 2 doses if you smoke cigarettes or if you have certain conditions.  Meningococcal vaccine.** /  1 dose if you are age 19 to 21 years and a first-year college  student living in a residence hall, or have one of several medical conditions, you need to get vaccinated against meningococcal disease. You may also need additional booster doses.  Hepatitis A vaccine.** / Consult your health care provider.  Hepatitis B vaccine.** / Consult your health care provider.  Haemophilus influenzae type b (Hib) vaccine.** / Consult your health care provider. Ages 40 to 64 years  Blood pressure check.** / Every 1 to 2 years.  Lipid and cholesterol check.** / Every 5 years beginning at age 20 years.  Lung cancer screening. / Every year if you are aged 55-80 years and have a 30-pack-year history of smoking and currently smoke or have quit within the past 15 years. Yearly screening is stopped once you have quit smoking for at least 15 years or develop a health problem that would prevent you from having lung cancer treatment.  Clinical breast exam.** / Every year after age 40 years.  BRCA-related cancer risk assessment.** / For women who have family members with a BRCA-related cancer (breast, ovarian, tubal, or peritoneal cancers).  Mammogram.** / Every year beginning at age 40 years and continuing for as long as you are in good health. Consult with your health care provider.  Pap test.** / Every 3 years starting at age 30 years through age 65 or 70 years with a history of 3 consecutive normal Pap tests.  HPV screening.** / Every 3 years from ages 30 years through ages 65 to 70 years with a history of 3 consecutive normal Pap tests.  Fecal occult blood test (FOBT) of stool. / Every year beginning at age 50 years and continuing until age 75 years. You may not need to do this test if you get a colonoscopy every 10 years.  Flexible sigmoidoscopy or colonoscopy.** / Every 5 years for a flexible sigmoidoscopy or every 10 years for a colonoscopy beginning at age 50 years and continuing until age 75 years.  Hepatitis C blood test.** / For all people born from 1945 through  1965 and any individual with known risks for hepatitis C.  Skin self-exam. / Monthly.  Influenza vaccine. / Every year.  Tetanus, diphtheria, and acellular pertussis (Tdap/Td) vaccine.** / Consult your health care provider. Pregnant women should receive 1 dose of Tdap vaccine during each pregnancy. 1 dose of Td every 10 years.  Varicella vaccine.** / Consult your health care provider. Pregnant females who do not have evidence of immunity should receive the first dose after pregnancy.  Zoster vaccine.** / 1 dose for adults aged 60 years or older.  Measles, mumps, rubella (MMR) vaccine.** / You need at least 1 dose of MMR if you were born in 1957 or later. You may also need a 2nd dose. For females of childbearing age, rubella immunity should be determined. If there is no evidence of immunity, females who are not pregnant should be vaccinated. If there is no evidence of immunity, females who are pregnant should delay immunization until after pregnancy.  Pneumococcal 13-valent conjugate (PCV13) vaccine.** / Consult your health care provider.  Pneumococcal polysaccharide (PPSV23) vaccine.** / 1 to 2 doses if you smoke cigarettes or if you have certain conditions.  Meningococcal vaccine.** / Consult your health care provider.  Hepatitis A vaccine.** / Consult your health care provider.  Hepatitis B vaccine.** / Consult your health care provider.  Haemophilus influenzae type b (Hib) vaccine.** / Consult your health care provider. Ages 65   years and over  Blood pressure check.** / Every 1 to 2 years.  Lipid and cholesterol check.** / Every 5 years beginning at age 42 years.  Lung cancer screening. / Every year if you are aged 31-80 years and have a 30-pack-year history of smoking and currently smoke or have quit within the past 15 years. Yearly screening is stopped once you have quit smoking for at least 15 years or develop a health problem that would prevent you from having lung cancer  treatment.  Clinical breast exam.** / Every year after age 31 years.  BRCA-related cancer risk assessment.** / For women who have family members with a BRCA-related cancer (breast, ovarian, tubal, or peritoneal cancers).  Mammogram.** / Every year beginning at age 42 years and continuing for as long as you are in good health. Consult with your health care provider.  Pap test.** / Every 3 years starting at age 49 years through age 61 or 46 years with 3 consecutive normal Pap tests. Testing can be stopped between 65 and 70 years with 3 consecutive normal Pap tests and no abnormal Pap or HPV tests in the past 10 years.  HPV screening.** / Every 3 years from ages 20 years through ages 60 or 21 years with a history of 3 consecutive normal Pap tests. Testing can be stopped between 65 and 70 years with 3 consecutive normal Pap tests and no abnormal Pap or HPV tests in the past 10 years.  Fecal occult blood test (FOBT) of stool. / Every year beginning at age 5 years and continuing until age 74 years. You may not need to do this test if you get a colonoscopy every 10 years.  Flexible sigmoidoscopy or colonoscopy.** / Every 5 years for a flexible sigmoidoscopy or every 10 years for a colonoscopy beginning at age 85 years and continuing until age 62 years.  Hepatitis C blood test.** / For all people born from [birth year] through 1966 and any individual with known risks for hepatitis C.  Osteoporosis screening.** / A one-time screening for women ages 29 years and over and women at risk for fractures or osteoporosis.  Skin self-exam. / Monthly.  Influenza vaccine. / Every year.  Tetanus, diphtheria, and acellular pertussis (Tdap/Td) vaccine.** / 1 dose of Td every 10 years.  Varicella vaccine.** / Consult your health care provider.  Zoster vaccine.** / 1 dose for adults aged 48 years or older.  Pneumococcal 13-valent conjugate (PCV13) vaccine.** / Consult your health care provider.  Pneumococcal  polysaccharide (PPSV23) vaccine.** / 1 dose for all adults aged 90 years and older.  Meningococcal vaccine.** / Consult your health care provider.  Hepatitis A vaccine.** / Consult your health care provider.  Hepatitis B vaccine.** / Consult your health care provider.  Haemophilus influenzae type b (Hib) vaccine.** / Consult your health care provider. ** Family history and personal history of risk and conditions may change your health care provider's recommendations. Document Released: 10/14/2001 Document Revised: 01/02/2014 Document Reviewed: 01/13/2011 [facility] Patient Information 2015 [place], [place]. This information is not intended to replace advice given to you by your health care provider. Make sure you discuss any questions you have with your health care provider. Ethinyl Estradiol; Etonogestrel vaginal ring What is this medicine? ETHINYL ESTRADIOL; ETONOGESTREL (ETH in il es tra DYE ole; et oh noe JES trel) vaginal ring is a flexible, vaginal ring used as a contraceptive (birth control method). This medicine combines two types of female hormones, an estrogen and a progestin. This ring is  used to prevent ovulation and pregnancy. Each ring is effective for one month. This medicine may be used for other purposes; ask your health care provider or pharmacist if you have questions. COMMON BRAND NAME(S): NuvaRing What should I tell my health care provider before I take this medicine? They need to know if you have or ever had any of these conditions: -abnormal vaginal bleeding -blood vessel disease or blood clots -breast, cervical, endometrial, ovarian, liver, or uterine cancer -diabetes -gallbladder disease -heart disease or recent heart attack -high blood pressure -high cholesterol -kidney disease -liver disease -migraine headaches -stroke -systemic lupus erythematosus (SLE) -tobacco smoker -an unusual or allergic reaction to estrogens, progestins, other medicines, foods, dyes, or  preservatives -pregnant or trying to get pregnant -breast-feeding How should I use this medicine? Insert the ring into your vagina as directed. Follow the directions on the prescription label. The ring will remain place for 3 weeks and is then removed for a 1-week break. A new ring is inserted 1 week after the last ring was removed, on the same day of the week. Do not use more often than directed. A patient package insert for the product will be given with each prescription and refill. Read this sheet carefully each time. The sheet may change frequently. Contact your pediatrician regarding the use of this medicine in children. Special care may be needed. This medicine has been used in female children who have started having menstrual periods. Overdosage: If you think you have taken too much of this medicine contact a poison control center or emergency room at once. NOTE: This medicine is only for you. Do not share this medicine with others. What if I miss a dose? You will need to replace your vaginal ring once a month as directed. If the ring should slip out, or if you leave it in longer or shorter than you should, contact your health care professional for advice. What may interact with this medicine? -acetaminophen -antibiotics or medicines for infections, especially rifampin, rifabutin, rifapentine, and griseofulvin, and possibly penicillins or tetracyclines -aprepitant -ascorbic acid (vitamin C) -atorvastatin -barbiturate medicines, such as phenobarbital -bosentan -carbamazepine -caffeine -clofibrate -cyclosporine -dantrolene -doxercalciferol -felbamate -grapefruit juice -hydrocortisone -medicines for anxiety or sleeping problems, such as diazepam or temazepam -medicines for diabetes, including pioglitazone -modafinil -mycophenolate -nefazodone -oxcarbazepine -phenytoin -prednisolone -ritonavir or other medicines for HIV infection or AIDS -rosuvastatin -selegiline -soy  isoflavones supplements -St. John's wort -tamoxifen or raloxifene -theophylline -thyroid hormones -topiramate -warfarin This list may not describe all possible interactions. Give your health care provider a list of all the medicines, herbs, non-prescription drugs, or dietary supplements you use. Also tell them if you smoke, drink alcohol, or use illegal drugs. Some items may interact with your medicine. What should I watch for while using this medicine? Visit your doctor or health care professional for regular checks on your progress. You will need a regular breast and pelvic exam and Pap smear while on this medicine. Use an additional method of contraception during the first cycle that you use this ring. If you have any reason to think you are pregnant, stop using this medicine right away and contact your doctor or health care professional. If you are using this medicine for hormone related problems, it may take several cycles of use to see improvement in your condition. Smoking increases the risk of getting a blood clot or having a stroke while you are using hormonal birth control, especially if you are more than 34 years old. You are strongly  advised not to smoke. This medicine can make your body retain fluid, making your fingers, hands, or ankles swell. Your blood pressure can go up. Contact your doctor or health care professional if you feel you are retaining fluid. This medicine can make you more sensitive to the sun. Keep out of the sun. If you cannot avoid being in the sun, wear protective clothing and use sunscreen. Do not use sun lamps or tanning beds/booths. If you wear contact lenses and notice visual changes, or if the lenses begin to feel uncomfortable, consult your eye care specialist. In some women, tenderness, swelling, or minor bleeding of the gums may occur. Notify your dentist if this happens. Brushing and flossing your teeth regularly may help limit this. See your dentist  regularly and inform your dentist of the medicines you are taking. If you are going to have elective surgery, you may need to stop using this medicine before the surgery. Consult your health care professional for advice. This medicine does not protect you against HIV infection (AIDS) or any other sexually transmitted diseases. What side effects may I notice from receiving this medicine? Side effects that you should report to your doctor or health care professional as soon as possible: -breast tissue changes or discharge -changes in vaginal bleeding during your period or between your periods -chest pain -coughing up blood -dizziness or fainting spells -headaches or migraines -leg, arm or groin pain -severe or sudden headaches -stomach pain (severe) -sudden shortness of breath -sudden loss of coordination, especially on one side of the body -speech problems -symptoms of vaginal infection like itching, irritation or unusual discharge -tenderness in the upper abdomen -vomiting -weakness or numbness in the arms or legs, especially on one side of the body -yellowing of the eyes or skin Side effects that usually do not require medical attention (report to your doctor or health care professional if they continue or are bothersome): -breakthrough bleeding and spotting that continues beyond the 3 initial cycles of pills -breast tenderness -mood changes, anxiety, depression, frustration, anger, or emotional outbursts -increased sensitivity to sun or ultraviolet light -nausea -skin rash, acne, or brown spots on the skin -weight gain (slight) This list may not describe all possible side effects. Call your doctor for medical advice about side effects. You may report side effects to FDA at 1-800-FDA-1088. Where should I keep my medicine? Keep out of the reach of children. Store at room temperature between 15 and 30 degrees C (59 and 86 degrees F) for up to 4 months. The product will expire after 4  months. Protect from light. Throw away any unused medicine after the expiration date. NOTE: This sheet is a summary. It may not cover all possible information. If you have questions about this medicine, talk to your doctor, pharmacist, or health care provider.  2015, [employer]vier/[employer]. (2008-08-03 12:03:58)

## 2014-08-22 NOTE — Progress Notes (Signed)
Subjective:     Debbie Bowen is a 32 y.o. female and is here for a comprehensive physical exam. The patient reports problems - new headaches from time to time.  Desires STD screen. 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 Desires children eventually but would like Nuva Ring for now.  History   Social History  . Marital Status: Single    Spouse Name: N/A    Number of Children: N/A  . Years of Education: N/A   Occupational History  . Not on file.   Social History Main Topics  . Smoking status: Never Smoker   . Smokeless tobacco: Never Used  . Alcohol Use: 0.0 oz/week    0 Not specified per week     Comment: occasionally  . Drug Use: No  . Sexual Activity:    Partners: Male    Birth Control/ Protection: Condom   Other Topics Concern  . Not on file   Social History Narrative  . No narrative on file   Health Maintenance  Topic Date Due  . PAP SMEAR  07/14/1998  . TETANUS/TDAP  07/15/1999  . INFLUENZA VACCINE  11/30/2014 (Originally 04/01/2014)    The following portions of the patient's history were reviewed and updated as appropriate: allergies, current medications, past family history, past medical history, past social history, past surgical history and problem list.  Review of Systems Pertinent items are noted in HPI.   Objective:    BP 122/87 mmHg  Pulse 80  Ht 5\' 7"  (1.702 m)  Wt 124 lb 12.8 oz (56.609 kg)  BMI 19.54 kg/m2  LMP 08/13/2014 (Exact Date) General appearance: alert, cooperative and appears stated age Head: Normocephalic, without obvious abnormality, atraumatic Neck: no adenopathy, supple, symmetrical, trachea midline and thyroid not enlarged, symmetric, no tenderness/mass/nodules Lungs: clear to auscultation bilaterally Breasts: normal appearance, no masses or tenderness Heart: regular rate and rhythm, S1, S2 normal, no murmur, click, rub or gallop Abdomen: soft, non-tender; bowel sounds normal; no masses,  no organomegaly Pelvic:  cervix normal in appearance, external genitalia normal, no adnexal masses or tenderness, no cervical motion tenderness, uterus normal size, shape, and consistency and vagina normal without discharge Extremities: extremities normal, atraumatic, no cyanosis or edema Pulses: 2+ and symmetric Skin: Skin color, texture, turgor normal. No rashes or lesions Lymph nodes: Cervical, supraclavicular, and axillary nodes normal. Neurologic: Grossly normal    Assessment:    Healthy female exam.       Plan:   Problem List Items Addressed This Visit    None    Visit Diagnoses    Encounter for routine gynecological examination    -  Primary    Relevant Orders       Cytology - PAP       CBC       Comprehensive metabolic panel       TSH       Lipid panel    Encounter for initial prescription of contraceptive pills        Relevant Medications       Etonogestrel-ethl est (NUVARING) 0.015-0.12 mg/24hr vaginal ring    Screen for STD (sexually transmitted disease)        Relevant Orders       HIV antibody       RPR       Hepatitis B surface antigen       Hepatitis C antibody    Screening for malignant neoplasm of cervix  Relevant Orders       Cytology - PAP    Vaginal discharge        Relevant Orders       Wet prep, genital         See After Visit Summary for Counseling Recommendations

## 2014-08-23 ENCOUNTER — Ambulatory Visit (INDEPENDENT_AMBULATORY_CARE_PROVIDER_SITE_OTHER): Payer: BC Managed Care – PPO | Admitting: Psychology

## 2014-08-23 DIAGNOSIS — F4323 Adjustment disorder with mixed anxiety and depressed mood: Secondary | ICD-10-CM

## 2014-08-23 LAB — CBC
HEMATOCRIT: 39.5 % (ref 36.0–46.0)
Hemoglobin: 13.2 g/dL (ref 12.0–15.0)
MCH: 27.8 pg (ref 26.0–34.0)
MCHC: 33.4 g/dL (ref 30.0–36.0)
MCV: 83.2 fL (ref 78.0–100.0)
MPV: 9.3 fL — ABNORMAL LOW (ref 9.4–12.4)
Platelets: 265 10*3/uL (ref 150–400)
RBC: 4.75 MIL/uL (ref 3.87–5.11)
RDW: 14.2 % (ref 11.5–15.5)
WBC: 4.5 10*3/uL (ref 4.0–10.5)

## 2014-08-23 LAB — COMPREHENSIVE METABOLIC PANEL
ALK PHOS: 47 U/L (ref 39–117)
ALT: 18 U/L (ref 0–35)
AST: 22 U/L (ref 0–37)
Albumin: 4.1 g/dL (ref 3.5–5.2)
BILIRUBIN TOTAL: 0.5 mg/dL (ref 0.2–1.2)
BUN: 12 mg/dL (ref 6–23)
CO2: 27 mEq/L (ref 19–32)
CREATININE: 0.76 mg/dL (ref 0.50–1.10)
Calcium: 9.2 mg/dL (ref 8.4–10.5)
Chloride: 100 mEq/L (ref 96–112)
GLUCOSE: 75 mg/dL (ref 70–99)
Potassium: 3.7 mEq/L (ref 3.5–5.3)
Sodium: 136 mEq/L (ref 135–145)
Total Protein: 7.5 g/dL (ref 6.0–8.3)

## 2014-08-23 LAB — LIPID PANEL
CHOLESTEROL: 210 mg/dL — AB (ref 0–200)
HDL: 61 mg/dL (ref >39)
LDL Cholesterol: 134 mg/dL — ABNORMAL HIGH (ref 0–99)
TRIGLYCERIDES: 77 mg/dL (ref <150)
Total CHOL/HDL Ratio: 3.4 Ratio
VLDL: 15 mg/dL (ref 0–40)

## 2014-08-23 LAB — WET PREP, GENITAL
Clue Cells Wet Prep HPF POC: NONE SEEN
Trich, Wet Prep: NONE SEEN
WBC, Wet Prep HPF POC: NONE SEEN
Yeast Wet Prep HPF POC: NONE SEEN

## 2014-08-24 LAB — HEPATITIS C ANTIBODY: HCV Ab: NEGATIVE

## 2014-08-24 LAB — TSH: TSH: 1.637 u[IU]/mL (ref 0.350–4.500)

## 2014-08-24 LAB — HEPATITIS B SURFACE ANTIGEN: Hepatitis B Surface Ag: NEGATIVE

## 2014-08-24 LAB — CYTOLOGY - PAP

## 2014-08-24 LAB — RPR

## 2014-08-24 LAB — HIV ANTIBODY (ROUTINE TESTING W REFLEX): HIV 1&2 Ab, 4th Generation: NONREACTIVE

## 2014-09-06 ENCOUNTER — Encounter: Payer: Self-pay | Admitting: Advanced Practice Midwife

## 2014-09-06 ENCOUNTER — Ambulatory Visit (INDEPENDENT_AMBULATORY_CARE_PROVIDER_SITE_OTHER): Payer: BLUE CROSS/BLUE SHIELD | Admitting: Advanced Practice Midwife

## 2014-09-06 VITALS — BP 112/75 | HR 65 | Ht 67.0 in | Wt 125.8 lb

## 2014-09-06 DIAGNOSIS — N898 Other specified noninflammatory disorders of vagina: Secondary | ICD-10-CM

## 2014-09-06 MED ORDER — METRONIDAZOLE 500 MG PO TABS: 500.0000 mg | ORAL_TABLET | Freq: Two times a day (BID) | ORAL | Status: DC

## 2014-09-06 MED ORDER — FLUCONAZOLE 150 MG PO TABS: 150.0000 mg | ORAL_TABLET | Freq: Once | ORAL | Status: DC

## 2014-09-06 MED ORDER — METRONIDAZOLE 0.75 % VA GEL: 1.0000 | Freq: Every day | VAGINAL | Status: DC

## 2014-09-06 NOTE — Progress Notes (Signed)
Subjective:     Patient ID: Debbie Bowen, female   DOB: 03-Aug-1981, 34 y.o.   MRN: [id number]  HPI 34 y.o. G1P0010 presents to the office for evaluation of vaginal discharge. She reports increased thick white discharge without odor, or itching/burning.  She has been treated frequently for BV and yeast and is currently taking a probiotic and trying topical treatments like Refresh.  Patient's last menstrual period was 08/13/2014 (exact date). She does report shorter cycles in the last year and mild cramping which is more than she used to have but periods remain regular and pain is mild.  She also reports ~20 lb weight gain in last calendar year. She denies vaginal bleeding, vaginal itching/burning, urinary symptoms, h/a, dizziness, n/v, or fever/chills.    Review of Systems  Constitutional: Negative for fever, chills and fatigue.  Respiratory: Negative for shortness of breath.   Cardiovascular: Negative for chest pain.  Genitourinary: Positive for vaginal discharge. Negative for dysuria, flank pain, vaginal bleeding, difficulty urinating, vaginal pain and pelvic pain.  Neurological: Negative for dizziness and headaches.  Psychiatric/Behavioral: Negative.        Objective:   Physical Exam  Constitutional: She is oriented to person, place, and time. She appears well-developed and well-nourished.  Neck: Normal range of motion.  Cardiovascular: Normal rate.   Pulmonary/Chest: Effort normal.  Abdominal: Soft. Bowel sounds are normal.  Musculoskeletal: Normal range of motion.  Neurological: She is alert and oriented to person, place, and time.  Skin: Skin is warm.  Psychiatric: She has a normal mood and affect. Her behavior is normal. Judgment and thought content normal.   Pelvic exam: Cervix pink, visually closed, without lesion, scant white creamy discharge, vaginal walls and external genitalia normal     Assessment:     Vaginal discharge Recent Hx vaginal candida and BV      Plan:     Discussed vaginal discharge with pt.  May be physiologic, not infectious.  Especially following recent treatment, may have increase in normal discharge. Will treat BV and yeast 1 more time.  Pt to report if discharge returns and causes itching/burning or has odor.  Diflucan, Flagyl, and Metrogel to pt pharmacy F/U PRN

## 2014-09-06 NOTE — Progress Notes (Signed)
Patient is having increased vaginal discharge that is thick in nature and a little clumpy, she is not sure if it is an infections and would like to have it checked out.

## 2014-09-07 LAB — WET PREP, GENITAL
CLUE CELLS WET PREP: NONE SEEN
Trich, Wet Prep: NONE SEEN
WBC WET PREP: NONE SEEN
YEAST WET PREP: NONE SEEN

## 2014-09-21 ENCOUNTER — Ambulatory Visit: Payer: BLUE CROSS/BLUE SHIELD | Admitting: Psychology

## 2014-10-12 ENCOUNTER — Ambulatory Visit (INDEPENDENT_AMBULATORY_CARE_PROVIDER_SITE_OTHER): Payer: BLUE CROSS/BLUE SHIELD | Admitting: Psychology

## 2014-10-12 DIAGNOSIS — F4323 Adjustment disorder with mixed anxiety and depressed mood: Secondary | ICD-10-CM

## 2014-10-24 ENCOUNTER — Ambulatory Visit: Payer: BLUE CROSS/BLUE SHIELD | Admitting: Psychology

## 2014-12-06 ENCOUNTER — Telehealth: Payer: Self-pay | Admitting: *Deleted

## 2014-12-06 DIAGNOSIS — B9689 Other specified bacterial agents as the cause of diseases classified elsewhere: Secondary | ICD-10-CM

## 2014-12-06 DIAGNOSIS — N760 Acute vaginitis: Principal | ICD-10-CM

## 2014-12-06 MED ORDER — METRONIDAZOLE 500 MG PO TABS: 500.0000 mg | ORAL_TABLET | Freq: Two times a day (BID) | ORAL | Status: DC

## 2014-12-06 NOTE — Telephone Encounter (Signed)
Patient is having an bacterial infection and she would like a refill of the Metronidazole.

## 2015-01-02 ENCOUNTER — Ambulatory Visit (INDEPENDENT_AMBULATORY_CARE_PROVIDER_SITE_OTHER): Payer: BLUE CROSS/BLUE SHIELD | Admitting: Obstetrics & Gynecology

## 2015-01-02 ENCOUNTER — Encounter: Payer: Self-pay | Admitting: Obstetrics & Gynecology

## 2015-01-02 VITALS — BP 106/71 | HR 82 | Wt 123.0 lb

## 2015-01-02 DIAGNOSIS — Z113 Encounter for screening for infections with a predominantly sexual mode of transmission: Secondary | ICD-10-CM | POA: Diagnosis not present

## 2015-01-02 DIAGNOSIS — N898 Other specified noninflammatory disorders of vagina: Secondary | ICD-10-CM | POA: Diagnosis not present

## 2015-01-02 NOTE — Progress Notes (Signed)
   Subjective:    Patient ID: Debbie Bowen, female    DOB: [birth year], 34 y.o.   MRN: [id number]  HPI  34 yo AA lady here with the complaint of a non-itchy vaginal discharge. She has previously used probiotics and Rephresh but has not done those things for about a month. Her wet preps here are always normal but she keeps getting treated with flagyl.  Review of Systems She uses condoms for contraception.    Objective:   Physical Exam  WNWHBFNAD Breathing and ambulating normally Abd- benign Absolutely pristine vaginal vault, paucity of discharge noted (She denies douching)      Assessment & Plan:  Subjective vaginal discharge- check wet prep, cervical cultures Rec probiotics and RePhresh RTC prn

## 2015-01-03 LAB — WET PREP, GENITAL
Trich, Wet Prep: NONE SEEN
YEAST WET PREP: NONE SEEN

## 2015-01-03 LAB — GC/CHLAMYDIA PROBE AMP
CT PROBE, AMP APTIMA: NEGATIVE
GC Probe RNA: NEGATIVE

## 2015-01-15 ENCOUNTER — Other Ambulatory Visit: Payer: Self-pay | Admitting: *Deleted

## 2015-01-15 MED ORDER — METRONIDAZOLE 500 MG PO TABS: 500.0000 mg | ORAL_TABLET | Freq: Two times a day (BID) | ORAL | Status: DC

## 2015-01-15 NOTE — Telephone Encounter (Signed)
Patients medication and refills have been called into the pharmacy.

## 2015-02-26 ENCOUNTER — Encounter: Payer: Self-pay | Admitting: Obstetrics and Gynecology

## 2015-02-26 ENCOUNTER — Ambulatory Visit (INDEPENDENT_AMBULATORY_CARE_PROVIDER_SITE_OTHER): Payer: BLUE CROSS/BLUE SHIELD | Admitting: Obstetrics and Gynecology

## 2015-02-26 VITALS — BP 122/81 | HR 61 | Resp 16 | Ht 67.0 in | Wt 124.0 lb

## 2015-02-26 DIAGNOSIS — N760 Acute vaginitis: Secondary | ICD-10-CM

## 2015-02-26 NOTE — Progress Notes (Signed)
Patient ID: Debbie Bowen, female   DOB: [birth year], 34 y.o.   MRN: [id number] 34 yo G1P0010 with LMP 01/29/2015 presenting today for the evaluation of vaginal discharge. Patient was recently treated for BV in May. She returns with a vaginal discharge that has been present over the past few days. She denies any pruritis and reports a mild odor. Patient is not concerned about STD and declined testing.   Past Medical History  Diagnosis Date  . UTI (lower urinary tract infection)     recurrent  . Insomnia   . BV (bacterial vaginosis)     recurrent   History reviewed. No pertinent past surgical history. Family History  Problem Relation Age of Onset  . Cancer Mother 66    Breast one side BRCA negative  . Diabetes Mother   . Juvenile idiopathic arthritis Father   . Diabetes Maternal Aunt   . Diabetes Paternal Aunt   . Cancer Paternal Grandmother     Breast Cancer   History  Substance Use Topics  . Smoking status: Never Smoker   . Smokeless tobacco: Never Used  . Alcohol Use: 0.0 oz/week    0 Standard drinks or equivalent per week     Comment: occasionally   ROS See pertinent in HPI  Blood pressure 122/81, pulse 61, resp. rate 16, height 5' 7"  (1.702 m), weight 124 lb (56.246 kg), last menstrual period 01/29/2015.  GENERAL: Well-developed, well-nourished female in no acute distress.  ABDOMEN: Soft, nontender, nondistended. No organomegaly. PELVIC: Normal external female genitalia. Vagina is pink and rugated.  Normal discharge. Normal appearing cervix. Uterus is normal in size. No adnexal mass or tenderness. EXTREMITIES: No cyanosis, clubbing, or edema, 2+ distal pulses.  A/P 34 yo here for the evaluation of vaginitis - Wet prep collected - Advised to continue probiotic - Discussed normal and abnormal discharge and concerning signs - Patient will be contacted with any abnormal results

## 2015-02-27 ENCOUNTER — Telehealth: Payer: Self-pay | Admitting: *Deleted

## 2015-02-27 DIAGNOSIS — B9689 Other specified bacterial agents as the cause of diseases classified elsewhere: Secondary | ICD-10-CM

## 2015-02-27 DIAGNOSIS — N760 Acute vaginitis: Principal | ICD-10-CM

## 2015-02-27 LAB — WET PREP, GENITAL
Trich, Wet Prep: NONE SEEN
Yeast Wet Prep HPF POC: NONE SEEN

## 2015-02-27 MED ORDER — CLINDAMYCIN PHOSPHATE 2 % VA CREA: 1.0000 | TOPICAL_CREAM | Freq: Every day | VAGINAL | Status: DC

## 2015-02-27 NOTE — Telephone Encounter (Signed)
Patient wet prep came back showing bacterial vaginosis.  Patient did not want Flagyl so per Dr. Kennon Rounds I have sent in Clindamycin cream to pharmacy.

## 2015-03-05 MED ORDER — METRONIDAZOLE 500 MG PO TABS: 500.0000 mg | ORAL_TABLET | Freq: Two times a day (BID) | ORAL | Status: AC

## 2015-03-05 NOTE — Addendum Note (Signed)
Addended by: Mora Bellman on: 03/05/2015 02:36 PM   Modules accepted: Orders

## 2015-03-14 ENCOUNTER — Other Ambulatory Visit: Payer: Self-pay | Admitting: *Deleted

## 2015-03-14 DIAGNOSIS — Z30011 Encounter for initial prescription of contraceptive pills: Secondary | ICD-10-CM

## 2015-03-14 MED ORDER — ETONOGESTREL-ETHINYL ESTRADIOL 0.12-0.015 MG/24HR VA RING: VAGINAL_RING | VAGINAL | Status: DC

## 2015-03-16 ENCOUNTER — Telehealth: Payer: Self-pay | Admitting: *Deleted

## 2015-03-16 DIAGNOSIS — Z30011 Encounter for initial prescription of contraceptive pills: Secondary | ICD-10-CM

## 2015-03-16 MED ORDER — ETONOGESTREL-ETHINYL ESTRADIOL 0.12-0.015 MG/24HR VA RING: VAGINAL_RING | VAGINAL | Status: DC

## 2015-03-16 NOTE — Telephone Encounter (Signed)
Received a refill request from pharmacy.  I have sent in refills.

## 2015-03-30 ENCOUNTER — Ambulatory Visit (INDEPENDENT_AMBULATORY_CARE_PROVIDER_SITE_OTHER): Payer: BLUE CROSS/BLUE SHIELD | Admitting: Obstetrics & Gynecology

## 2015-03-30 ENCOUNTER — Encounter: Payer: Self-pay | Admitting: Obstetrics & Gynecology

## 2015-03-30 VITALS — BP 135/84 | HR 84 | Wt 121.0 lb

## 2015-03-30 DIAGNOSIS — N898 Other specified noninflammatory disorders of vagina: Secondary | ICD-10-CM | POA: Diagnosis not present

## 2015-03-30 MED ORDER — HYLAFEM VA SUPP: 1.0000 | VAGINAL | Status: DC

## 2015-03-30 MED ORDER — FLUCONAZOLE 150 MG PO TABS: 150.0000 mg | ORAL_TABLET | Freq: Once | ORAL | Status: DC

## 2015-03-30 MED ORDER — METRONIDAZOLE 500 MG PO TABS: 500.0000 mg | ORAL_TABLET | Freq: Two times a day (BID) | ORAL | Status: DC

## 2015-03-30 NOTE — Progress Notes (Signed)
   Subjective:    Patient ID: Debbie Bowen, female    DOB: 06-21-1981, 34 y.o.   MRN: [id number]  HPI She is here with recurrent vaginal discharge, non-itchy.   Review of Systems     Objective:   Physical Exam WNWHBFNAD Abd- benign Breathing, conversing, and ambulating normally Thick creamy white discharge, rather a lot      Assessment & Plan:  Recurrent vaginal discharge Treat with hylafem, diflucan, and flagyl Wet prep sent

## 2015-03-31 LAB — WET PREP, GENITAL
Trich, Wet Prep: NONE SEEN
Yeast Wet Prep HPF POC: NONE SEEN

## 2015-04-12 ENCOUNTER — Encounter: Payer: Self-pay | Admitting: Obstetrics and Gynecology

## 2015-09-24 ENCOUNTER — Telehealth: Payer: Self-pay

## 2015-09-24 NOTE — Telephone Encounter (Signed)
Yes you can set her up for an EGD.

## 2015-09-24 NOTE — Telephone Encounter (Signed)
Please review the message below and tell me whether to keep appt as is or go ahead and schedule EGD. Thans!

## 2015-09-24 NOTE — Telephone Encounter (Signed)
Patient called in with concerns of nausea and epigastric pain x 1 month. States that she is no longer taking her Pantoprazole as she does not have a PCP at this time and has not seen Vickey Huger, NP since 2015. States that this was making her pain and nausea better but this never went away then when she ran out of medication, this has gotten much worse. Pain occurs intermittently and is always in the epigastric area, does not radiate. Occurs randomly and is not brought on by anything in particular. There is nothing that makes this better or worse. Describes pain as a burning ache. Has not tried anything over the counter to help with GERD.  I scheduled her to be seen by Dr. Allen Norris on 2/27. Do you think he would like to do an EGD prior to this so that we can begin treatment for this pain as patient is very uncomfortable? Please call patient and schedule if he would like to do this, I told her that you may be calling to schedule her.

## 2015-09-25 ENCOUNTER — Other Ambulatory Visit: Payer: Self-pay

## 2015-09-25 NOTE — Telephone Encounter (Signed)
Pt has been scheduled for an EGD on Thursday, Feb 2nd at [facility]. I have kept pt's appt that is on 10/29/15 as we may need a follow up. Please check for precertification. Epigastric Pain (R10.13) Nausea (R11.0)

## 2015-09-26 ENCOUNTER — Encounter: Payer: Self-pay | Admitting: *Deleted

## 2015-09-26 NOTE — Telephone Encounter (Signed)
No authorization is required for CPT: [id number] with [employer]. Insurance has been verified.

## 2015-10-01 ENCOUNTER — Ambulatory Visit (INDEPENDENT_AMBULATORY_CARE_PROVIDER_SITE_OTHER): Payer: BLUE CROSS/BLUE SHIELD | Admitting: Family Medicine

## 2015-10-01 ENCOUNTER — Encounter: Payer: Self-pay | Admitting: Family Medicine

## 2015-10-01 VITALS — BP 121/86 | HR 71 | Resp 18 | Ht 66.5 in | Wt 133.0 lb

## 2015-10-01 DIAGNOSIS — N920 Excessive and frequent menstruation with regular cycle: Secondary | ICD-10-CM | POA: Diagnosis not present

## 2015-10-01 DIAGNOSIS — Z3169 Encounter for other general counseling and advice on procreation: Secondary | ICD-10-CM

## 2015-10-01 LAB — TSH: TSH: 1.203 u[IU]/mL (ref 0.350–4.500)

## 2015-10-01 NOTE — Progress Notes (Signed)
    Subjective:    Patient ID: Debbie Bowen is a 35 y.o. female presenting with Conception Counseling  on 10/01/2015  HPI: Trying to conceive. She is using ovulation kits which has said she only ovulated twice since August. She has 26 day cycles. Previously having regular cycles. She is having some cramping and left sided pain. Notes some ? Hot flashes at night. Some night sweats. Her partner has 2 kids.  Review of Systems  Constitutional: Negative for fever and chills.  Respiratory: Negative for shortness of breath.   Cardiovascular: Negative for chest pain.  Gastrointestinal: Negative for nausea, vomiting and abdominal pain.  Genitourinary: Negative for dysuria.  Skin: Negative for rash.      Objective:    BP 121/86 mmHg  Pulse 71  Resp 18  Ht 5' 6.5" (1.689 m)  Wt 133 lb (60.328 kg)  BMI 21.15 kg/m2  LMP 09/07/2015 (Exact Date) Physical Exam  Constitutional: She is oriented to person, place, and time. She appears well-developed and well-nourished. No distress.  HENT:  Head: Normocephalic and atraumatic.  Eyes: No scleral icterus.  Neck: Neck supple.  Cardiovascular: Normal rate.   Pulmonary/Chest: Effort normal.  Abdominal: Soft.  Neurological: She is alert and oriented to person, place, and time.  Skin: Skin is warm and dry.  Psychiatric: She has a normal mood and affect.        Assessment & Plan:  1. Encounter for preconception consultation Discussed timing of intercourse, common causes of infertility. If no pregnancy in 3 months, return for further testing, REI referral and possible HSG.  2. Menorrhagia with regular cycle Given change in cycle and ? Ovulation--will check hormone levels - TSH - Follicle stimulating hormone  Total face-to-face time with patient: 15 minutes. Over 50% of encounter was spent on counseling and coordination of care. Return in about 3 months (around 12/30/2015).  Shaida Route S 10/01/2015 2:17 PM

## 2015-10-01 NOTE — Patient Instructions (Signed)
Preparing for Pregnancy Before trying to become pregnant, make an appointment with your health care provider (preconception care). The goal is to help you have a healthy, safe pregnancy. At your first appointment, your health care provider will:   Do a complete physical exam, including a Pap test.  Take a complete medical history.  Give you advice and help you resolve any problems. PRECONCEPTION CHECKLIST Here is a list of the basics to cover with your health care provider at your preconception visit:  Medical history.  Tell your health care provider about any diseases you have had. Many diseases can affect your pregnancy.  Include your partner's medical history and family history.  Make sure you have been tested for sexually transmitted infections (STIs). These can affect your pregnancy. In some cases, they can be passed to your baby. Tell your health care provider about any history of STIs.  Make sure your health care provider knows about any previous problems you have had with conception or pregnancy.  Tell your health care provider about any medicine you take. This includes herbal supplements and over-the-counter medicines.  Make sure all your immunizations are up to date. You may need to make additional appointments.  Ask your health care provider if you need any vaccinations or if there are any you should avoid.  Diet.  It is especially important to eat a healthy, balanced diet with the right nutrients when you are pregnant.  Ask your health care provider to help you get to a healthy weight before pregnancy.  If you are overweight, you are at higher risk for certain complications. These include high blood pressure, diabetes, and preterm birth.  If you are underweight, you are more likely to have a low-birth-weight baby.  Lifestyle.  Tell your health care provider about lifestyle factors such as alcohol use, drug use, or smoking.  Describe any harmful substances you may  be exposed to at work or home. These can include chemicals, pesticides, and radiation.  Mental health.  Let your health care provider know if you have been feeling depressed or anxious.  Let your health care provider know if you have a history of substance abuse.  Let your health care provider know if you do not feel safe at home. HOME INSTRUCTIONS TO PREPARE FOR PREGNANCY Follow your health care provider's advice and instructions.   Keep an accurate record of your menstrual periods. This makes it easier for your health care provider to determine your baby's due date.  Begin taking prenatal vitamins and folic acid supplements daily. Take them as directed by your health care provider.  Eat a balanced diet. Get help from a nutrition counselor if you have questions or need help.  Get regular exercise. Try to be active for at least 30 minutes a day most days of the week.  Quit smoking, if you smoke.  Do not drink alcohol.  Do not take illegal drugs.  Get medical problems, such as diabetes or high blood pressure, under control.  If you have diabetes, make sure you do the following:  Have good blood sugar control. If you have type 1 diabetes, use multiple daily doses of insulin. Do not use split-dose or premixed insulin.  Have an eye exam by a qualified eye care professional trained in caring for people with diabetes.  Get evaluated by your health care provider for cardiovascular disease.  Get to a healthy weight. If you are overweight or obese, reduce your weight with the help of a qualified health   professional such as a registered dietitian. Ask your health care provider what the right weight range is for you. HOW DO I KNOW I AM PREGNANT? You may be pregnant if you have been sexually active and you miss your period. Symptoms of early pregnancy include:   Mild cramping.  Very light vaginal bleeding (spotting).  Feeling unusually tired.  Morning sickness. If you have any of  these symptoms, take a home pregnancy test. These tests look for a hormone called human chorionic gonadotropin (hCG) in your urine. Your body begins to make this hormone during early pregnancy. These tests are very accurate. Wait until at least the first day you miss your period to take one. If you get a positive result, call your health care provider to make appointments for prenatal care. WHAT SHOULD I DO IF I BECOME PREGNANT?  Make an appointment with your health care provider by week 12 of your pregnancy at the latest.  Do not smoke. Smoking can be harmful to your baby.  Do not drink alcoholic beverages. Alcohol is related to a number of birth defects.  Avoid toxic odors and chemicals.  You may continue to have sexual intercourse if it does not cause pain or other problems, such as vaginal bleeding.   This information is not intended to replace advice given to you by your health care provider. Make sure you discuss any questions you have with your health care provider.   Document Released: 07/31/2008 Document Revised: 09/08/2014 Document Reviewed: 07/25/2013 Elsevier Interactive Patient Education 2016 Elsevier Inc.  

## 2015-10-01 NOTE — Progress Notes (Signed)
Pt has been trying to conceive since August 2016, started using the ovulation kit.  Based on the kit, pt did not ovulate the first 2 months.  Pt menstrual cycle is usually every 26 with a duration of 4-5 days.

## 2015-10-02 LAB — FOLLICLE STIMULATING HORMONE: FSH: 3.2 m[IU]/mL

## 2015-10-02 NOTE — Discharge Instructions (Signed)

## 2015-10-04 ENCOUNTER — Ambulatory Visit
Admission: RE | Admit: 2015-10-04 | Discharge: 2015-10-04 | Disposition: A | Discharge status: Home / Self Care | Payer: BLUE CROSS/BLUE SHIELD | Source: Ambulatory Visit | Attending: Gastroenterology | Admitting: Gastroenterology

## 2015-10-04 ENCOUNTER — Other Ambulatory Visit: Payer: Self-pay | Admitting: Gastroenterology

## 2015-10-04 ENCOUNTER — Ambulatory Visit: Payer: BLUE CROSS/BLUE SHIELD | Admitting: Student in an Organized Health Care Education/Training Program

## 2015-10-04 ENCOUNTER — Encounter
Admission: RE | Disposition: A | Discharge status: Home / Self Care | Payer: Self-pay | Source: Ambulatory Visit | Attending: Gastroenterology

## 2015-10-04 DIAGNOSIS — G43A1 Cyclical vomiting, intractable: Secondary | ICD-10-CM

## 2015-10-04 DIAGNOSIS — Z79899 Other long term (current) drug therapy: Secondary | ICD-10-CM | POA: Insufficient documentation

## 2015-10-04 DIAGNOSIS — R110 Nausea: Secondary | ICD-10-CM | POA: Diagnosis present

## 2015-10-04 DIAGNOSIS — R112 Nausea with vomiting, unspecified: Secondary | ICD-10-CM | POA: Insufficient documentation

## 2015-10-04 DIAGNOSIS — F988 Other specified behavioral and emotional disorders with onset usually occurring in childhood and adolescence: Secondary | ICD-10-CM | POA: Insufficient documentation

## 2015-10-04 DIAGNOSIS — R1115 Cyclical vomiting syndrome unrelated to migraine: Secondary | ICD-10-CM | POA: Insufficient documentation

## 2015-10-04 DIAGNOSIS — K219 Gastro-esophageal reflux disease without esophagitis: Secondary | ICD-10-CM | POA: Insufficient documentation

## 2015-10-04 HISTORY — DX: Scoliosis, unspecified: M41.9

## 2015-10-04 HISTORY — DX: Presence of spectacles and contact lenses: Z97.3

## 2015-10-04 HISTORY — DX: Encounter for fitting and adjustment of orthodontic device: Z46.4

## 2015-10-04 HISTORY — DX: Other specified behavioral and emotional disorders with onset usually occurring in childhood and adolescence: F98.8

## 2015-10-04 HISTORY — DX: Reserved for inherently not codable concepts without codable children: IMO0001

## 2015-10-04 HISTORY — DX: Gastro-esophageal reflux disease without esophagitis: K21.9

## 2015-10-04 HISTORY — PX: ESOPHAGOGASTRODUODENOSCOPY (EGD) WITH PROPOFOL: SHX5813

## 2015-10-04 SURGERY — ESOPHAGOGASTRODUODENOSCOPY (EGD) WITH PROPOFOL
Anesthesia: Monitor Anesthesia Care | Wound class: Clean Contaminated

## 2015-10-04 MED ORDER — GLYCOPYRROLATE 0.2 MG/ML IJ SOLN
INTRAMUSCULAR | Status: DC | PRN
  Administered 2015-10-04: 0.2 mg via INTRAVENOUS

## 2015-10-04 MED ORDER — PANTOPRAZOLE SODIUM 40 MG PO TBEC: 40.0000 mg | DELAYED_RELEASE_TABLET | Freq: Every day | ORAL | Status: DC

## 2015-10-04 MED ORDER — LACTATED RINGERS IV SOLN
INTRAVENOUS | Status: DC
  Administered 2015-10-04: 08:00:00 via INTRAVENOUS

## 2015-10-04 MED ORDER — LIDOCAINE HCL (CARDIAC) 20 MG/ML IV SOLN
INTRAVENOUS | Status: DC | PRN
  Administered 2015-10-04: 50 mg via INTRAVENOUS

## 2015-10-04 MED ORDER — PROPOFOL 10 MG/ML IV BOLUS
INTRAVENOUS | Status: DC | PRN
  Administered 2015-10-04 (×2): 30 mg via INTRAVENOUS
  Administered 2015-10-04: 70 mg via INTRAVENOUS

## 2015-10-04 SURGICAL SUPPLY — 39 items
BALLN DILATOR 10-12 8 (BALLOONS)
BALLN DILATOR 12-15 8 (BALLOONS)
BALLN DILATOR 15-18 8 (BALLOONS)
BALLN DILATOR CRE 0-12 8 (BALLOONS)
BALLN DILATOR ESOPH 8 10 CRE (MISCELLANEOUS) IMPLANT
BALLOON DILATOR 12-15 8 (BALLOONS) IMPLANT
BALLOON DILATOR 15-18 8 (BALLOONS) IMPLANT
BALLOON DILATOR CRE 0-12 8 (BALLOONS) IMPLANT
BLOCK BITE 60FR ADLT L/F GRN (MISCELLANEOUS) ×2 IMPLANT
CANISTER SUCT 1200ML W/VALVE (MISCELLANEOUS) ×2 IMPLANT
FCP ESCP3.2XJMB 240X2.8X (MISCELLANEOUS)
FORCEPS BIOP RAD 4 LRG CAP 4 (CUTTING FORCEPS) ×1 IMPLANT
FORCEPS BIOP RJ4 240 W/NDL (MISCELLANEOUS)
FORCEPS ESCP3.2XJMB 240X2.8X (MISCELLANEOUS) IMPLANT
GOWN CVR UNV OPN BCK APRN NK (MISCELLANEOUS) ×2 IMPLANT
GOWN ISOL THUMB LOOP REG UNIV (MISCELLANEOUS) ×4
HEMOCLIP INSTINCT (CLIP) IMPLANT
INJECTOR VARIJECT VIN23 (MISCELLANEOUS) IMPLANT
KIT CO2 TUBING (TUBING) IMPLANT
KIT DEFENDO VALVE AND CONN (KITS) IMPLANT
KIT ENDO PROCEDURE OLY (KITS) ×2 IMPLANT
LIGATOR MULTIBAND 6SHOOTER MBL (MISCELLANEOUS) IMPLANT
MARKER SPOT ENDO TATTOO 5ML (MISCELLANEOUS) IMPLANT
PAD GROUND ADULT SPLIT (MISCELLANEOUS) IMPLANT
SNARE SHORT THROW 13M SML OVAL (MISCELLANEOUS) IMPLANT
SNARE SHORT THROW 30M LRG OVAL (MISCELLANEOUS) IMPLANT
SPOT EX ENDOSCOPIC TATTOO (MISCELLANEOUS)
SUCTION POLY TRAP 4CHAMBER (MISCELLANEOUS) IMPLANT
SYR INFLATION 60ML (SYRINGE) IMPLANT
TRAP SUCTION POLY (MISCELLANEOUS) IMPLANT
TUBING CONN 6MMX3.1M (TUBING)
TUBING SUCTION CONN 0.25 STRL (TUBING) IMPLANT
UNDERPAD 30X60 958B10 (PK) (MISCELLANEOUS) IMPLANT
VALVE BIOPSY ENDO (VALVE) IMPLANT
VARIJECT INJECTOR VIN23 (MISCELLANEOUS)
WATER AUXILLARY (MISCELLANEOUS) IMPLANT
WATER STERILE IRR 250ML POUR (IV SOLUTION) ×2 IMPLANT
WATER STERILE IRR 500ML POUR (IV SOLUTION) IMPLANT
WIRE CRE 18-20MM 8CM F G (MISCELLANEOUS) IMPLANT

## 2015-10-04 NOTE — Anesthesia Postprocedure Evaluation (Signed)
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

## 2015-10-04 NOTE — Op Note (Signed)
[facility] Patient Name: Debbie Bowen Procedure Date: 10/04/2015 9:22 AM MRN: [id number] Account #: [id number] Date of Birth: 10/16/1980 Admit Type: Outpatient Age: 35 Room: [facility] OR ROOM 01 Gender: Female Note Status: Finalized Procedure:         Upper GI endoscopy Indications:       Nausea Providers:         Lucilla Lame, MD Medicines:         Propofol per Anesthesia Complications:     No immediate complications. Procedure:         Pre-Anesthesia Assessment:                    - Prior to the procedure, a History and Physical was                     performed, and patient medications and allergies were                     reviewed. The patient's tolerance of previous anesthesia                     was also reviewed. The risks and benefits of the procedure                     and the sedation options and risks were discussed with the                     patient. All questions were answered, and informed consent                     was obtained. Prior Anticoagulants: The patient has taken                     no previous anticoagulant or antiplatelet agents. ASA                     Grade Assessment: II - A patient with mild systemic                     disease. After reviewing the risks and benefits, the                     patient was deemed in satisfactory condition to undergo                     the procedure.                    After obtaining informed consent, the endoscope was passed                     under direct vision. Throughout the procedure, the                     patient's blood pressure, pulse, and oxygen saturations                     were monitored continuously. The Olympus GIF H180J                     endoscope (S#: [id number]) was introduced through the mouth,                     and advanced to the second part of duodenum. The upper  GI                     endoscopy was accomplished without difficulty. The patient          tolerated the procedure well. Findings:      The examined esophagus was normal. Two random biopsies were obtained in       the lower third of the esophagus with cold forceps for histology.      The stomach was normal.      The examined duodenum was normal. Impression:        - Normal esophagus.                    - Normal stomach.                    - Normal examined duodenum.                    - Two random biopsies were obtained in the lower third of                     the esophagus. Recommendation:    - Await pathology results.                    - Use a proton pump inhibitor PO daily. Procedure Code(s): --- Professional ---                    [phone], Esophagogastroduodenoscopy, flexible, transoral;                     with biopsy, single or multiple Diagnosis Code(s): --- Professional ---                    R11.2, Nausea with vomiting, unspecified CPT copyright 2014 American Medical Association. All rights reserved. The codes documented in this report are preliminary and upon coder review may  be revised to meet current compliance requirements. Lucilla Lame, MD 10/04/2015 9:28:41 AM This report has been signed electronically. Number of Addenda: 0 Note Initiated On: 10/04/2015 9:22 AM Total Procedure Duration: 0 hours 2 minutes 3 seconds       [facility]

## 2015-10-04 NOTE — Anesthesia Procedure Notes (Signed)
Procedure Name: MAC Performed by: Rhiana Morash Pre-anesthesia Checklist: Patient identified, Emergency Drugs available, Suction available, Timeout performed and Patient being monitored Patient Re-evaluated:Patient Re-evaluated prior to inductionOxygen Delivery Method: Nasal cannula Placement Confirmation: positive ETCO2       

## 2015-10-04 NOTE — Transfer of Care (Signed)
Immediate Anesthesia Transfer of Care Note  Patient: Debbie Bowen  Procedure(s) Performed: Procedure(s): ESOPHAGOGASTRODUODENOSCOPY (EGD) WITH PROPOFOL (N/A)  Patient Location: PACU  Anesthesia Type: MAC  Level of Consciousness: awake, alert  and patient cooperative  Airway and Oxygen Therapy: Patient Spontanous Breathing and Patient connected to supplemental oxygen  Post-op Assessment: Post-op Vital signs reviewed, Patient's Cardiovascular Status Stable, Respiratory Function Stable, Patent Airway and No signs of Nausea or vomiting  Post-op Vital Signs: Reviewed and stable  Complications: No apparent anesthesia complications

## 2015-10-04 NOTE — H&P (Signed)
  [facility]  [address], [place], [place] [address] Phone: [phone] Fax : [phone]  Primary Care Physician:  No primary care provider on file. Primary Gastroenterologist:  Dr. Allen Norris  Pre-Procedure History & Physical: HPI:  Debbie Bowen is a 35 y.o. female is here for an endoscopy.   Past Medical History  Diagnosis Date  . UTI (lower urinary tract infection)     recurrent  . Insomnia   . BV (bacterial vaginosis)     recurrent  . Scoliosis     no issues  . GERD (gastroesophageal reflux disease)   . ADD (attention deficit disorder)   . Wears contact lenses   . Orthodontics     invisalign    Past Surgical History  Procedure Laterality Date  . No past surgeries      Prior to Admission medications   Medication Sig Start Date End Date Taking? Authorizing Provider  amphetamine-dextroamphetamine (ADDERALL XR) 10 MG 24 hr capsule Take 10 mg by mouth as needed. Reported on 10/01/2015   Yes Historical Provider, MD  amphetamine-dextroamphetamine (ADDERALL XR) 15 MG 24 hr capsule Take 15 mg by mouth as needed.   Yes Historical Provider, MD  Probiotic Product (PROBIOTIC DAILY PO) Take by mouth daily.   Yes Historical Provider, MD    Allergies as of 09/25/2015  . (No Known Allergies)    Family History  Problem Relation Age of Onset  . Cancer Mother 77    Breast one side BRCA negative  . Diabetes Mother   . Juvenile idiopathic arthritis Father   . Diabetes Maternal Aunt   . Diabetes Paternal Aunt   . Cancer Paternal Grandmother     Breast Cancer    Social History   Social History  . Marital Status: Single    Spouse Name: N/A  . Number of Children: N/A  . Years of Education: N/A   Occupational History  . Not on file.   Social History Main Topics  . Smoking status: Never Smoker   . Smokeless tobacco: Never Used  . Alcohol Use: 1.2 oz/week    2 Glasses of wine, 0 Standard drinks or equivalent per week     Comment:  occasionally  . Drug Use: No  . Sexual Activity:    Partners: Male   Other Topics Concern  . Not on file   Social History Narrative    Review of Systems: See HPI, otherwise negative ROS  Physical Exam: BP 122/80 mmHg  Pulse 64  Temp(Src) 98.4 F (36.9 C) (Temporal)  Resp 17  Ht _0  (1.676 m)  Wt 130 lb (58.968 kg)  BMI 20.99 kg/m2  SpO2 100%  LMP 09/07/2015 (Exact Date) General:   Alert,  pleasant and cooperative in NAD Head:  Normocephalic and atraumatic. Neck:  Supple; no masses or thyromegaly. Lungs:  Clear throughout to auscultation.    Heart:  Regular rate and rhythm. Abdomen:  Soft, nontender and nondistended. Normal bowel sounds, without guarding, and without rebound.   Neurologic:  Alert and  oriented x4;  grossly normal neurologically.  Impression/Plan: Debbie Bowen is here for an endoscopy to be performed for nausea and epigastric pain  Risks, benefits, limitations, and alternatives regarding  endoscopy have been reviewed with the patient.  Questions have been answered.  All parties agreeable.   Ollen Bowl, MD  10/04/2015, 8:37 AM

## 2015-10-04 NOTE — Anesthesia Preprocedure Evaluation (Signed)
Anesthesia Evaluation  Patient identified by MRN, date of birth, ID band  Reviewed: Allergy & Precautions, NPO status , Patient's Chart, lab work & pertinent test results, reviewed documented beta blocker date and time   Airway Mallampati: I  TM Distance: >3 FB Neck ROM: Full    Dental no notable dental hx.    Pulmonary neg pulmonary ROS,    Pulmonary exam normal        Cardiovascular negative cardio ROS Normal cardiovascular exam     Neuro/Psych negative neurological ROS     GI/Hepatic Neg liver ROS, GERD  Controlled,  Endo/Other  negative endocrine ROS  Renal/GU negative Renal ROS     Musculoskeletal negative musculoskeletal ROS (+)   Abdominal   Peds  Hematology negative hematology ROS (+)   Anesthesia Other Findings   Reproductive/Obstetrics negative OB ROS                             Anesthesia Physical Anesthesia Plan  ASA: I  Anesthesia Plan: MAC   Post-op Pain Management:    Induction: Intravenous  Airway Management Planned:   Additional Equipment:   Intra-op Plan:   Post-operative Plan:   Informed Consent: I have reviewed the patients History and Physical, chart, labs and discussed the procedure including the risks, benefits and alternatives for the proposed anesthesia with the patient or authorized representative who has indicated his/her understanding and acceptance.     Plan Discussed with: CRNA  Anesthesia Plan Comments:         Anesthesia Quick Evaluation

## 2015-10-05 ENCOUNTER — Encounter: Payer: Self-pay | Admitting: Gastroenterology

## 2015-10-25 ENCOUNTER — Ambulatory Visit (INDEPENDENT_AMBULATORY_CARE_PROVIDER_SITE_OTHER): Payer: BLUE CROSS/BLUE SHIELD | Admitting: Family Medicine

## 2015-10-25 ENCOUNTER — Encounter: Payer: Self-pay | Admitting: Family Medicine

## 2015-10-25 VITALS — BP 115/76 | HR 76 | Resp 18 | Ht 67.0 in | Wt 136.0 lb

## 2015-10-25 DIAGNOSIS — N83201 Unspecified ovarian cyst, right side: Secondary | ICD-10-CM | POA: Diagnosis not present

## 2015-10-25 NOTE — Patient Instructions (Signed)
Ovarian Cyst An ovarian cyst is a fluid-filled sac that forms on an ovary. The ovaries are small organs that produce eggs in women. Various types of cysts can form on the ovaries. Most are not cancerous. Many do not cause problems, and they often go away on their own. Some may cause symptoms and require treatment. Common types of ovarian cysts include:  Functional cysts--These cysts may occur every month during the menstrual cycle. This is normal. The cysts usually go away with the next menstrual cycle if the woman does not get pregnant. Usually, there are no symptoms with a functional cyst.  Endometrioma cysts--These cysts form from the tissue that lines the uterus. They are also called "chocolate cysts" because they become filled with blood that turns brown. This type of cyst can cause pain in the lower abdomen during intercourse and with your menstrual period.  Cystadenoma cysts--This type develops from the cells on the outside of the ovary. These cysts can get very big and cause lower abdomen pain and pain with intercourse. This type of cyst can twist on itself, cut off its blood supply, and cause severe pain. It can also easily rupture and cause a lot of pain.  Dermoid cysts--This type of cyst is sometimes found in both ovaries. These cysts may contain different kinds of body tissue, such as skin, teeth, hair, or cartilage. They usually do not cause symptoms unless they get very big.  Theca lutein cysts--These cysts occur when too much of a certain hormone (human chorionic gonadotropin) is produced and overstimulates the ovaries to produce an egg. This is most common after procedures used to assist with the conception of a baby (in vitro fertilization). CAUSES   Fertility drugs can cause a condition in which multiple large cysts are formed on the ovaries. This is called ovarian hyperstimulation syndrome.  A condition called polycystic ovary syndrome can cause hormonal imbalances that can lead to  nonfunctional ovarian cysts. SIGNS AND SYMPTOMS  Many ovarian cysts do not cause symptoms. If symptoms are present, they may include:  Pelvic pain or pressure.  Pain in the lower abdomen.  Pain during sexual intercourse.  Increasing girth (swelling) of the abdomen.  Abnormal menstrual periods.  Increasing pain with menstrual periods.  Stopping having menstrual periods without being pregnant. DIAGNOSIS  These cysts are commonly found during a routine or annual pelvic exam. Tests may be ordered to find out more about the cyst. These tests may include:  Ultrasound.  X-ray of the pelvis.  CT scan.  MRI.  Blood tests. TREATMENT  Many ovarian cysts go away on their own without treatment. Your health care provider may want to check your cyst regularly for 2-3 months to see if it changes. For women in menopause, it is particularly important to monitor a cyst closely because of the higher rate of ovarian cancer in menopausal women. When treatment is needed, it may include any of the following:  A procedure to drain the cyst (aspiration). This may be done using a long needle and ultrasound. It can also be done through a laparoscopic procedure. This involves using a thin, lighted tube with a tiny camera on the end (laparoscope) inserted through a small incision.  Surgery to remove the whole cyst. This may be done using laparoscopic surgery or an open surgery involving a larger incision in the lower abdomen.  Hormone treatment or birth control pills. These methods are sometimes used to help dissolve a cyst. HOME CARE INSTRUCTIONS   Only take over-the-counter   or prescription medicines as directed by your health care provider.  Follow up with your health care provider as directed.  Get regular pelvic exams and Pap tests. SEEK MEDICAL CARE IF:   Your periods are late, irregular, or painful, or they stop.  Your pelvic pain or abdominal pain does not go away.  Your abdomen becomes  larger or swollen.  You have pressure on your bladder or trouble emptying your bladder completely.  You have pain during sexual intercourse.  You have feelings of fullness, pressure, or discomfort in your stomach.  You lose weight for no apparent reason.  You feel generally ill.  You become constipated.  You lose your appetite.  You develop acne.  You have an increase in body and facial hair.  You are gaining weight, without changing your exercise and eating habits.  You think you are pregnant. SEEK IMMEDIATE MEDICAL CARE IF:   You have increasing abdominal pain.  You feel sick to your stomach (nauseous), and you throw up (vomit).  You develop a fever that comes on suddenly.  You have abdominal pain during a bowel movement.  Your menstrual periods become heavier than usual. MAKE SURE YOU:  Understand these instructions.  Will watch your condition.  Will get help right away if you are not doing well or get worse.   This information is not intended to replace advice given to you by your health care provider. Make sure you discuss any questions you have with your health care provider.   Document Released: 08/18/2005 Document Revised: 08/23/2013 Document Reviewed: 04/25/2013 Elsevier Interactive Patient Education 2016 Elsevier Inc.  

## 2015-10-25 NOTE — Progress Notes (Signed)
    Subjective:    Patient ID: Debbie Bowen is a 35 y.o. female presenting with  Chief Complaint  Patient presents with  . ? Vaginal mass    on 10/25/2015  HPI: Here today for evaluation of a lump in vagina, soft and not hard.. Reports being nauseated. Has h/o irregular cycles. Started with RLQ pain on 4 days ago. Notes that it is cramping in nature and mild. It comes and goes. Notes nothing makes it better or worse. Notes it more when standing. Having some bleeding. Not time for cycle. LMP 10/10/15  Review of Systems  Constitutional: Negative for fever and chills.  Respiratory: Negative for shortness of breath.   Cardiovascular: Negative for chest pain.  Gastrointestinal: Negative for nausea, vomiting and abdominal pain.  Genitourinary: Negative for dysuria.  Skin: Negative for rash.      Objective:     Filed Vitals:   10/25/15 0813  BP: 115/76  Pulse: 76  Resp: 18    Physical Exam  Constitutional: She is oriented to person, place, and time. She appears well-developed and well-nourished. No distress.  HENT:  Head: Normocephalic and atraumatic.  Eyes: No scleral icterus.  Neck: Neck supple.  Cardiovascular: Normal rate.   Pulmonary/Chest: Effort normal.  Abdominal: Soft.  Genitourinary:  BUS normal, vagina is pink and rugated - there is no mass in the vagina despite valsalva, cervix is nulliparous without lesion, uterus is small and anteverted, no left adnexal mass or tenderness. Right adnexal fullness and minimal tenderness.   Neurological: She is alert and oriented to person, place, and time.  Skin: Skin is warm and dry.  Psychiatric: She has a normal mood and affect.        Assessment & Plan:  Cyst of right ovary - Likely related to ovulation-offered u/s and she will call us back to schedule if symptoms fail to resolve   Return if symptoms worsen or fail to improve.  PRATT,TANYA S 10/25/2015 8:09 AM

## 2015-10-25 NOTE — Progress Notes (Signed)
Pt here today c/o lower rt pelvic pain that started Sunday night.  Pt also felt a "lumpy" area inside her vagina back close to the rectum that was not painful.

## 2015-10-29 ENCOUNTER — Ambulatory Visit: Payer: Self-pay | Admitting: Gastroenterology

## 2015-11-19 ENCOUNTER — Ambulatory Visit: Payer: Self-pay | Admitting: Primary Care

## 2015-12-06 ENCOUNTER — Ambulatory Visit (INDEPENDENT_AMBULATORY_CARE_PROVIDER_SITE_OTHER): Payer: BLUE CROSS/BLUE SHIELD | Admitting: Primary Care

## 2015-12-06 ENCOUNTER — Encounter: Payer: Self-pay | Admitting: Primary Care

## 2015-12-06 ENCOUNTER — Telehealth: Payer: Self-pay | Admitting: Primary Care

## 2015-12-06 VITALS — BP 116/72 | HR 76 | Temp 98.6°F | Ht 66.0 in | Wt 131.8 lb

## 2015-12-06 DIAGNOSIS — F909 Attention-deficit hyperactivity disorder, unspecified type: Secondary | ICD-10-CM | POA: Insufficient documentation

## 2015-12-06 DIAGNOSIS — R21 Rash and other nonspecific skin eruption: Secondary | ICD-10-CM | POA: Diagnosis not present

## 2015-12-06 DIAGNOSIS — N390 Urinary tract infection, site not specified: Secondary | ICD-10-CM | POA: Insufficient documentation

## 2015-12-06 DIAGNOSIS — R41840 Attention and concentration deficit: Secondary | ICD-10-CM | POA: Diagnosis not present

## 2015-12-06 DIAGNOSIS — R109 Unspecified abdominal pain: Secondary | ICD-10-CM | POA: Diagnosis not present

## 2015-12-06 NOTE — Assessment & Plan Note (Signed)
Intermittent for the past 6 years, recent occurrence several days ago which has mostly cleared. Suspect his is eczema. Will have her trial low dose OTC hydrocortisone for further outbreaks. She is to notify me if no improvement. No rash on exam today.

## 2015-12-06 NOTE — Telephone Encounter (Signed)
Noted  

## 2015-12-06 NOTE — Assessment & Plan Note (Signed)
Diagnosed with ADHD in college, no recent testing. Doesn't like the way she feels in adderall. Will send her for formal testing to learn her current ADHD status. Referral placed.

## 2015-12-06 NOTE — Telephone Encounter (Signed)
Referral placed on [place] to be scheduled with Dr. Lurline Hare or Dr. Glennon Hamilton for formal ADHD testing. Pt aware she will be contacted directly to get scheduled.

## 2015-12-06 NOTE — Progress Notes (Signed)
Pre visit review using our clinic review tool, if applicable. No additional management support is needed unless otherwise documented below in the visit note. 

## 2015-12-06 NOTE — Assessment & Plan Note (Signed)
Symptoms of dysuria, pelvic pressure, hematuria, frequency 3-4 times annually for past 8 years. Has current RX for Macrobid that she will use on first day of symptoms. Will continue to monitor.

## 2015-12-06 NOTE — Patient Instructions (Signed)
Start Pantoprazole tablets as prescribed, this may help with your symptoms.  Try a small amount of hydrocortisone 1% cream to your lips twice daily when you experience the rash.  Please schedule a physical with me within the next 3-6 months. We will do same day labs based off of labs from your GYN.  It was a pleasure to meet you today! Please don't hesitate to call me with any questions. Welcome to [facility]!

## 2015-12-06 NOTE — Assessment & Plan Note (Signed)
Intermittent since 2015, suspicion for gallbladder to be cause. Evaluation with [place], CT and HIDA scan which were normal in 2015. She is to start pantoprazole 40 mg that she received from [facility]. Will continue to monitor.

## 2015-12-06 NOTE — Progress Notes (Signed)
Subjective:    Patient ID: Debbie Bowen, female    DOB: February 16, 1981, 35 y.o.   MRN: [id number]  HPI  Debbie Bowen is a 35 year old female who presents today to establish care and discuss the problems mentioned below. Will obtain old records.  1) ADHD: Diagnosed over 10 years ago. She currently has a prescription for Adderall XR 25 mg that she will only take as needed "when she has a lot of work to do". She has difficulty with task completion and focusing. She las took this medication in February 2017. She doesn't like the way she feels on her medication so she does not take it daily. She has not completed testing in over 10 years.   2) GERD/Abdominal Pain: History of abdominal pain since 2015. She underwent evaluation with Ultrasound, CT of her abdomen, and  HIDA scan in August 2015 for recurrent, severe episodes of epigastric pain with radiation to RUQ. She was told her gallbladder was functioning well, but could possibly have sludge that was not detectable.   She recently had an upper endoscopy which was normal per patient. Initiated on pantoprazole recently by [facility] (Dr. Roselyn Reef). She has not started taking the prescription as of yet. She will experience symptoms of burning epigastric pain with radiation to her RUQ once every 2 months.   3) Recurrent UTI: Long history of UTI's for the past 8 years since visiting [place]. No recent problems until just recently. Her symptoms will include dysuria, pelvic pressure, and hematuria. Over the 3-4 months she's noticed symptoms return. She was evaluated by Urology years ago who provided her with a prescription for Macrobid that she will take if she develops symptoms. She will take 2 capsules at onset of symptoms with complete resolve. Her prior PCP had continued this prescription.  4) Rash: Intermittently for the past 6 years, only to lips. Her rash presents as small bumps. Denies pain, occasionally itching. Three to four days ago had an  outbreak of her rash that has improved for the most part. She follows with a Dermatologist but has not mentioned this.   Review of Systems  Constitutional: Negative for fever.  Respiratory: Negative for shortness of breath.   Cardiovascular: Negative for chest pain.  Gastrointestinal: Negative for abdominal pain, diarrhea, constipation and blood in stool.  Genitourinary: Negative for dysuria and frequency.  Skin: Positive for rash.       Past Medical History  Diagnosis Date  . UTI (lower urinary tract infection)     recurrent  . Insomnia   . BV (bacterial vaginosis)     recurrent  . Scoliosis     no issues  . GERD (gastroesophageal reflux disease)   . ADD (attention deficit disorder)   . Wears contact lenses   . Orthodontics     invisalign    Social History   Social History  . Marital Status: Single    Spouse Name: N/A  . Number of Children: N/A  . Years of Education: N/A   Occupational History  . Not on file.   Social History Main Topics  . Smoking status: Never Smoker   . Smokeless tobacco: Never Used  . Alcohol Use: 1.2 oz/week    2 Glasses of wine, 0 Standard drinks or equivalent per week     Comment: occasionally  . Drug Use: No  . Sexual Activity:    Partners: Male    Birth Control/ Protection: None   Other Topics Concern  .  Not on file   Social History Narrative   Single.    No children.   Self Employed.   Enjoys projects around her house.     Past Surgical History  Procedure Laterality Date  . No past surgeries    . Esophagogastroduodenoscopy (egd) with propofol N/A 10/04/2015    Procedure: ESOPHAGOGASTRODUODENOSCOPY (EGD) WITH PROPOFOL;  Surgeon: Lucilla Lame, MD;  Location: [place];  Service: Endoscopy;  Laterality: N/A;    Family History  Problem Relation Age of Onset  . Cancer Mother 64    Breast one side BRCA negative  . Diabetes Mother   . Juvenile idiopathic arthritis Father   . Diabetes Maternal Aunt   . Diabetes  Paternal Aunt   . Cancer Paternal Grandmother     Breast Cancer    No Known Allergies  Current Outpatient Prescriptions on File Prior to Visit  Medication Sig Dispense Refill  . amphetamine-dextroamphetamine (ADDERALL XR) 10 MG 24 hr capsule Take 10 mg by mouth as needed. Reported on 10/25/2015    . amphetamine-dextroamphetamine (ADDERALL XR) 15 MG 24 hr capsule Take 15 mg by mouth as needed. Reported on 10/25/2015    . Probiotic Product (PROBIOTIC DAILY PO) Take by mouth daily.    . pantoprazole (PROTONIX) 40 MG tablet Take 1 tablet (40 mg total) by mouth daily. (Patient not taking: Reported on 12/06/2015) 30 tablet 4   No current facility-administered medications on file prior to visit.    BP 116/72 mmHg  Pulse 76  Temp(Src) 98.6 F (37 C) (Oral)  Ht _0  (1.676 m)  Wt 131 lb 12.8 oz (59.784 kg)  BMI 21.28 kg/m2  SpO2 98%  LMP 12/06/2015    Objective:   Physical Exam  Constitutional: She appears well-nourished.  Neck: Neck supple.  Cardiovascular: Normal rate and regular rhythm.   Pulmonary/Chest: Effort normal and breath sounds normal.  Skin: Skin is warm and dry.  Psychiatric: She has a normal mood and affect.          Assessment & Plan:

## 2015-12-19 ENCOUNTER — Other Ambulatory Visit: Payer: Self-pay | Admitting: Primary Care

## 2015-12-19 DIAGNOSIS — Z0000 Encounter for general adult medical examination without abnormal findings: Secondary | ICD-10-CM

## 2015-12-20 ENCOUNTER — Ambulatory Visit (INDEPENDENT_AMBULATORY_CARE_PROVIDER_SITE_OTHER): Payer: BLUE CROSS/BLUE SHIELD | Admitting: Family Medicine

## 2015-12-20 ENCOUNTER — Encounter: Payer: Self-pay | Admitting: Family Medicine

## 2015-12-20 VITALS — BP 114/75 | HR 79 | Resp 20 | Wt 131.0 lb

## 2015-12-20 DIAGNOSIS — N979 Female infertility, unspecified: Secondary | ICD-10-CM

## 2015-12-20 NOTE — Progress Notes (Signed)
    Subjective:    Patient ID: Debbie Bowen is a 35 y.o. female presenting with Follow-up  on 12/20/2015  HPI: Has been trying to achieve pregnancy since August last year. No ovulation initially, but then began. Knows about timing of intercourse. No h/o STD, no pelvic infection. Partner with 2 kids (1 and 22). Has monthly cycles though they vary in length. Previously with pain and ? Ovarian cyst. Pain is only dull now.  Review of Systems  Constitutional: Negative for fever and chills.  Respiratory: Negative for shortness of breath.   Cardiovascular: Negative for chest pain.  Gastrointestinal: Negative for nausea, vomiting and abdominal pain.  Genitourinary: Negative for dysuria.  Skin: Negative for rash.      Objective:    BP 114/75 mmHg  Pulse 79  Resp 20  Wt 131 lb (59.421 kg)  LMP 12/06/2015 Physical Exam  Constitutional: She is oriented to person, place, and time. She appears well-developed and well-nourished. No distress.  HENT:  Head: Normocephalic and atraumatic.  Eyes: No scleral icterus.  Neck: Neck supple.  Cardiovascular: Normal rate.   Pulmonary/Chest: Effort normal.  Abdominal: Soft.  Neurological: She is alert and oriented to person, place, and time.  Skin: Skin is warm and dry.  Psychiatric: She has a normal mood and affect.        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 Partner has kids including 84 month old. Will need HSG and other lab testing.      Relevant Orders   TSH   Anti mullerian hormone   Vitamin B12   Ambulatory referral to Endocrinology   Follicle stimulating hormone      Total face-to-face time with patient: 15 minutes. Over 50% of encounter was spent on counseling and coordination of care. Return if symptoms worsen or fail to improve.  PRATT,TANYA S 12/20/2015 10:01 AM

## 2015-12-20 NOTE — Patient Instructions (Addendum)
Infertility Infertility is when you are unable to get pregnant (conceive) after a year of having sex regularly without using birth control. Infertility can also mean that a woman is not able to carry a pregnancy to full term.  Both women and men can have fertility problems. WHAT CAUSES INFERTILITY? What Causes Infertility in Women? There are many possible causes of infertility in women. For some women, the cause of infertility is not known (unexplained infertility). Infertility can also be linked to more than one cause. Infertility problems in women can be caused by problems with the menstrual cycle or reproductive organs, certain medical conditions, and factors related to lifestyle and age.  Problems with your menstrual cycle can interfere with your ovaries producing eggs (ovulation). This can make it difficult to get pregnant. This includes having a menstrual cycle that is very long, very short, or irregular.  Problems with reproductive organs can include:  An abnormally narrow cervix or a cervix that does not remain closed during a pregnancy.  A blockage in your fallopian tubes.  An abnormally shaped uterus.  Uterine fibroids. This is a tissue mass (tumor) that can develop on your uterus.  Medical conditions that can affect a woman's fertility include:  Polycystic ovarian syndrome (PCOS). This is a hormonal disorder that can cause small cysts to grow on your ovaries. This is the most common cause of infertility in women.  Endometriosis. This is a condition in which the tissue that lines your uterus (endometrium) grows outside of its normal location.  Primary ovary insufficiency. This is when your ovaries stop producing eggs and hormones before the age of 61.  Sexually transmitted diseases, such as chlamydia or gonorrhea. These infections can cause scarring in your fallopian tubes. This makes it difficult for eggs to reach your uterus.  Autoimmune disorders. These are disorders in  which your immune system attacks normal, healthy cells.  Hormone imbalances.  Other factors include:  Age. A woman's fertility declines with age, especially after her mid-89s.  Being under- or overweight.  Drinking too much alcohol.  Using drugs.  Exercising excessively.  Being exposed to environmental toxins, such as radiation, pesticides, and certain chemicals. What Causes Infertility in Men? There are many causes of infertility in men. Infertility can be linked to more than one cause. Infertility problems in men can be caused by problems with sperm or the reproductive organs, certain medical conditions, and factors related to lifestyle and age. Some men have unexplained infertility.   Problems with sperm. Infertility can result if there is a problem producing:  Enough sperm (low sperm count).  Enough normally-shaped sperm (sperm morphology).  Sperm that are able to reach the egg (poor motility).  Infertility can also be caused by:  A problem with hormones.  Enlarged veins (varicoceles), cysts (spermatoceles), or tumors of the testicles.  Sexual dysfunction.  Injury to the testicles.  A birth defect, such as not having the tubes that carry sperm (vas deferens).  Medical conditions that can affect a man's fertility include:  Diabetes.  Cancer treatments, such as chemotherapy or radiation.  Klinefelter syndrome. This is an inherited genetic disorder.   Thyroid problems, such as an under- or overactive thyroid.  Cystic fibrosis.  Sexually transmitted diseases.  Other factors include:  Age. A man's fertility declines with age.  Drinking too much alcohol.  Using drugs.  Being exposed to environmental toxins, such as pesticides and lead. WHAT ARE THE SYMPTOMS OF INFERTILITY? Being unable to get pregnant after one year of having  regular sex without using birth control is the only sign of infertility.  HOW IS INFERTILITY DIAGNOSED? In order to be diagnosed  with infertility, both partners will have a physical exam. Both partners will also have an extensive medical and sexual history taken. If there is no obvious reason for infertility, additional tests may be done. What Tests Will Women Have? Women may first have tests to check whether they are ovulating each month. The tests may include:  Blood tests to check hormone levels.  An ultrasound of the ovaries. This looks for possible problems on or in the ovaries.  Taking a small sample of the tissue that lines the uterus for examination under a microscope (endometrial biopsy). Women who are ovulating may have additional tests. These may include:  Hysterosalpingography.  This is an X-ray of the fallopian tubes and uterus taken after a specific type of dye is injected.  This test can show the shape of the uterus and whether the fallopian tubes are open.  Laparoscopy.  In this test, a lighted tube (laparoscope) is used to look for problems in the fallopian tubes and other female organs.  Transvaginal ultrasound.  This is an imaging test to check for abnormalities of the uterus and ovaries.  A health care provider can use this test to count the number of follicles on the ovaries.  Hysteroscopy.  This test involves using a lighted tube to examine the cervix and inside the uterus.  It is done to find any abnormalities inside the uterus. What Tests Will Men Have? Tests for men's infertility includes:  Semen tests to check sperm count, morphology, and motility.  Blood tests to check for hormone levels.  Taking a small sample of tissue from inside a testicle (biopsy). This is examined under a microscope.  Blood tests to check for genetic abnormalities (genetic testing). HOW ARE WOMEN TREATED FOR INFERTILITY?  Treatment depends on the cause of infertility. Most cases of infertility in women are treated with medicine or surgery.  Women may take medicine to:  Correct ovulation  problems.  Treat other health conditions, such as PCOS.  Surgery may be done to:  Repair damage to the ovaries, fallopian tubes, cervix, or uterus.  Remove growths from the uterus.  Remove scar tissue from the uterus, pelvis, or other female organs. HOW ARE MEN TREATED FOR INFERTILITY?  Treatment depends on the cause of infertility. Most cases of infertility in men are treated with medicine or surgery.   Men may take medicine to:  Correct hormone problems.  Treat other health conditions.  Treat sexual dysfunction.  Surgery may be done to:  Remove blockages in the reproductive tract.  Correct other structural problems of the reproductive tract. WHAT IS ASSISTED REPRODUCTIVE TECHNOLOGY? Assisted reproductive technology (ART) refers to all treatments and procedures that combine eggs and sperm outside the body to try to help a couple conceive. ART is often combined with fertility drugs to stimulate ovulation. Sometimes ART is done using eggs retrieved from another woman's body (donor eggs) or from previously frozen fertilized eggs (embryos).  There are different types of ART. These include:   Intrauterine insemination (IUI).  In this procedure, sperm is placed directly into a woman's uterus with a long, thin tube.  This may be most effective for infertility caused by sperm problems, including low sperm count and low motility.  Can be used in combination with fertility drugs.  In vitro fertilization (IVF).  This is often done when a woman's fallopian tubes are blocked  or when a man has low sperm counts.  Fertility drugs stimulate the ovaries to produce multiple eggs. Once mature, these eggs are removed from the body and combined with the sperm to be fertilized.  These fertilized eggs are then placed in the woman's uterus.   This information is not intended to replace advice given to you by your health care provider. Make sure you discuss any questions you have with your  health care provider.   Document Released: 08/21/2003 Document Revised: 05/09/2015 Document Reviewed: 05/03/2014 Elsevier Interactive Patient Education [employer].

## 2015-12-20 NOTE — Assessment & Plan Note (Signed)
Given age, will initiate w/u and refer to REI. Partner has kids including 51 month old. Will need HSG and other lab testing.

## 2015-12-21 ENCOUNTER — Ambulatory Visit (INDEPENDENT_AMBULATORY_CARE_PROVIDER_SITE_OTHER): Payer: BLUE CROSS/BLUE SHIELD | Admitting: Primary Care

## 2015-12-21 ENCOUNTER — Other Ambulatory Visit: Payer: Self-pay

## 2015-12-21 ENCOUNTER — Other Ambulatory Visit (INDEPENDENT_AMBULATORY_CARE_PROVIDER_SITE_OTHER): Payer: BLUE CROSS/BLUE SHIELD

## 2015-12-21 ENCOUNTER — Encounter: Payer: Self-pay | Admitting: Primary Care

## 2015-12-21 VITALS — BP 108/68 | HR 73 | Temp 98.3°F | Ht 66.0 in | Wt 131.0 lb

## 2015-12-21 DIAGNOSIS — J302 Other seasonal allergic rhinitis: Secondary | ICD-10-CM | POA: Diagnosis not present

## 2015-12-21 DIAGNOSIS — Z0000 Encounter for general adult medical examination without abnormal findings: Secondary | ICD-10-CM

## 2015-12-21 NOTE — Progress Notes (Signed)
Pre visit review using our clinic review tool, if applicable. No additional management support is needed unless otherwise documented below in the visit note. 

## 2015-12-21 NOTE — Progress Notes (Signed)
Subjective:    Patient ID: Debbie Bowen, female    DOB: 1980/09/27, 35 y.o.   MRN: [id number]  HPI  Debbie Bowen is a 35 year old female who presents today with a chief complaint of sore throat. She also reports nasal congestion, fatigue, cough, post nasal drip. Her symptoms have been present for the past 1 month and over the past week her symptoms became worse with sore throat and swelling. Today she's feeling improved. She's taken nothing OTC for her symptoms. Denies sick contacts.   Review of Systems  Constitutional: Positive for fatigue. Negative for fever.  HENT: Positive for congestion, postnasal drip and sore throat.   Eyes: Negative for itching.  Respiratory: Positive for cough. Negative for shortness of breath.        Past Medical History  Diagnosis Date  . UTI (lower urinary tract infection)     recurrent  . Insomnia   . BV (bacterial vaginosis)     recurrent  . Scoliosis     no issues  . GERD (gastroesophageal reflux disease)   . ADD (attention deficit disorder)   . Wears contact lenses   . Orthodontics     invisalign     Social History   Social History  . Marital Status: Single    Spouse Name: N/A  . Number of Children: N/A  . Years of Education: N/A   Occupational History  . Not on file.   Social History Main Topics  . Smoking status: Never Smoker   . Smokeless tobacco: Never Used  . Alcohol Use: 1.2 oz/week    2 Glasses of wine, 0 Standard drinks or equivalent per week     Comment: occasionally  . Drug Use: No  . Sexual Activity:    Partners: Male    Birth Control/ Protection: None   Other Topics Concern  . Not on file   Social History Narrative   Single.    No children.   Self Employed.   Enjoys projects around her house.     Past Surgical History  Procedure Laterality Date  . No past surgeries    . Esophagogastroduodenoscopy (egd) with propofol N/A 10/04/2015    Procedure: ESOPHAGOGASTRODUODENOSCOPY (EGD) WITH PROPOFOL;   Surgeon: Lucilla Lame, MD;  Location: [place];  Service: Endoscopy;  Laterality: N/A;    Family History  Problem Relation Age of Onset  . Cancer Mother 23    Breast one side BRCA negative  . Diabetes Mother   . Juvenile idiopathic arthritis Father   . Diabetes Maternal Aunt   . Diabetes Paternal Aunt   . Cancer Paternal Grandmother     Breast Cancer    No Known Allergies  Current Outpatient Prescriptions on File Prior to Visit  Medication Sig Dispense Refill  . amphetamine-dextroamphetamine (ADDERALL XR) 10 MG 24 hr capsule Take 10 mg by mouth as needed. Reported on 12/20/2015    . amphetamine-dextroamphetamine (ADDERALL XR) 15 MG 24 hr capsule Take 15 mg by mouth as needed. Reported on 12/20/2015    . Prenatal MV-Min-Fe Fum-FA-DHA (PRENATAL 1 PO) Take 1 tablet by mouth daily.    . Probiotic Product (PROBIOTIC DAILY PO) Take by mouth daily.    . pantoprazole (PROTONIX) 40 MG tablet Take 1 tablet (40 mg total) by mouth daily. (Patient not taking: Reported on 12/06/2015) 30 tablet 4   No current facility-administered medications on file prior to visit.    BP 108/68 mmHg  Pulse 73  Temp(Src) 98.3 F (  36.8 C) (Oral)  Ht _0  (1.676 m)  Wt 131 lb (59.421 kg)  BMI 21.15 kg/m2  SpO2 98%  LMP 12/06/2015    Objective:   Physical Exam  Constitutional: She appears well-nourished.  HENT:  Right Ear: Tympanic membrane and ear canal normal.  Left Ear: Tympanic membrane and ear canal normal.  Nose: Right sinus exhibits no maxillary sinus tenderness and no frontal sinus tenderness. Left sinus exhibits no maxillary sinus tenderness and no frontal sinus tenderness.  Mouth/Throat: Oropharynx is clear and moist.  Eyes: Conjunctivae are normal.  Neck: Neck supple.  Cardiovascular: Normal rate and regular rhythm.   Pulmonary/Chest: Effort normal and breath sounds normal. She has no wheezes. She has no rales.  Lymphadenopathy:    She has no cervical adenopathy.  Skin: Skin is  warm and dry.          Assessment & Plan:  Seasonal Allergic Rhinitis:  PND, cough, fatigue, sneezing x 1 month. Does not feel or appear infectious/ill. Exam unremarkable with clear lungs, do not suspect lower respiratory involvement. Suspect allergies and will treat with supportive measures. Flonase, Claritin, Delsym.  Follow up PRN.

## 2015-12-21 NOTE — Addendum Note (Signed)
Addended by: Marchia Bond on: 12/21/2015 01:41 PM   Modules accepted: Orders

## 2015-12-21 NOTE — Patient Instructions (Signed)
Your symptoms are related to allergies.   Start taking Zyrtec or Claritin for allergy symptoms. Take 1 tablet by mouth at bedtime.  Nasal Congestion: Try using Flonase (fluticasone) nasal spray. Instill 2 sprays in each nostril once daily. This may be purchased over the counter.  Cough: Delsym or Robitussin. These may be purchased over the counter.  Please notify me if you develop persistent fevers of 101, start coughing up green mucous, notice increased fatigue or weakness.  Increase consumption of water intake and rest.  It was a pleasure to see you today!

## 2015-12-22 LAB — LIPID PANEL
CHOL/HDL RATIO: 3.6 ratio (ref 0.0–4.4)
Cholesterol, Total: 208 mg/dL — ABNORMAL HIGH (ref 100–199)
HDL: 58 mg/dL (ref >39)
LDL CALC: 131 mg/dL — AB (ref 0–99)
Triglycerides: 97 mg/dL (ref 0–149)
VLDL CHOLESTEROL CAL: 19 mg/dL (ref 5–40)

## 2015-12-22 LAB — CBC WITH DIFFERENTIAL/PLATELET
Basophils Absolute: 0 10*3/uL (ref 0.0–0.2)
Basos: 0 %
EOS (ABSOLUTE): 0.1 10*3/uL (ref 0.0–0.4)
EOS: 2 %
HEMATOCRIT: 37.5 % (ref 34.0–46.6)
HEMOGLOBIN: 12.8 g/dL (ref 11.1–15.9)
IMMATURE GRANS (ABS): 0 10*3/uL (ref 0.0–0.1)
IMMATURE GRANULOCYTES: 0 %
LYMPHS ABS: 1.2 10*3/uL (ref 0.7–3.1)
LYMPHS: 34 %
MCH: 28 pg (ref 26.6–33.0)
MCHC: 34.1 g/dL (ref 31.5–35.7)
MCV: 82 fL (ref 79–97)
MONOCYTES: 9 %
Monocytes Absolute: 0.3 10*3/uL (ref 0.1–0.9)
NEUTROS PCT: 55 %
Neutrophils Absolute: 2 10*3/uL (ref 1.4–7.0)
Platelets: 262 10*3/uL (ref 150–379)
RBC: 4.57 x10E6/uL (ref 3.77–5.28)
RDW: 14.3 % (ref 12.3–15.4)
WBC: 3.6 10*3/uL (ref 3.4–10.8)

## 2015-12-22 LAB — COMPREHENSIVE METABOLIC PANEL
ALBUMIN: 4 g/dL (ref 3.5–5.5)
ALT: 10 IU/L (ref 0–32)
AST: 17 IU/L (ref 0–40)
Albumin/Globulin Ratio: 1.2 (ref 1.2–2.2)
Alkaline Phosphatase: 47 IU/L (ref 39–117)
BUN / CREAT RATIO: 12 (ref 9–23)
BUN: 10 mg/dL (ref 6–20)
Bilirubin Total: 0.3 mg/dL (ref 0.0–1.2)
CALCIUM: 9.5 mg/dL (ref 8.7–10.2)
CO2: 23 mmol/L (ref 18–29)
CREATININE: 0.84 mg/dL (ref 0.57–1.00)
Chloride: 99 mmol/L (ref 96–106)
GFR calc Af Amer: 104 mL/min/{1.73_m2} (ref >59)
GFR, EST NON AFRICAN AMERICAN: 90 mL/min/{1.73_m2} (ref >59)
GLOBULIN, TOTAL: 3.4 g/dL (ref 1.5–4.5)
GLUCOSE: 86 mg/dL (ref 65–99)
Potassium: 4.2 mmol/L (ref 3.5–5.2)
SODIUM: 138 mmol/L (ref 134–144)
TOTAL PROTEIN: 7.4 g/dL (ref 6.0–8.5)

## 2015-12-23 LAB — VITAMIN B12: Vitamin B-12: 371 pg/mL (ref 211–946)

## 2015-12-23 LAB — FOLLICLE STIMULATING HORMONE: FSH: 5.8 m[IU]/mL

## 2015-12-23 LAB — ANTI MULLERIAN HORMONE: ANTI-MULLERIAN HORMONE (AMH): 3.56 ng/mL

## 2015-12-23 LAB — TSH: TSH: 1.39 u[IU]/mL (ref 0.450–4.500)

## 2015-12-24 ENCOUNTER — Other Ambulatory Visit: Payer: Self-pay

## 2015-12-27 ENCOUNTER — Ambulatory Visit: Payer: Self-pay | Admitting: Family Medicine

## 2015-12-31 ENCOUNTER — Encounter: Payer: Self-pay | Admitting: Primary Care

## 2015-12-31 ENCOUNTER — Ambulatory Visit (INDEPENDENT_AMBULATORY_CARE_PROVIDER_SITE_OTHER): Payer: BLUE CROSS/BLUE SHIELD | Admitting: Primary Care

## 2015-12-31 VITALS — BP 114/70 | HR 74 | Temp 98.2°F | Ht 66.0 in | Wt 131.4 lb

## 2015-12-31 DIAGNOSIS — Z0000 Encounter for general adult medical examination without abnormal findings: Secondary | ICD-10-CM | POA: Diagnosis not present

## 2015-12-31 DIAGNOSIS — E7800 Pure hypercholesterolemia, unspecified: Secondary | ICD-10-CM | POA: Insufficient documentation

## 2015-12-31 DIAGNOSIS — Z0001 Encounter for general adult medical examination with abnormal findings: Secondary | ICD-10-CM | POA: Insufficient documentation

## 2015-12-31 DIAGNOSIS — E785 Hyperlipidemia, unspecified: Secondary | ICD-10-CM | POA: Insufficient documentation

## 2015-12-31 DIAGNOSIS — R109 Unspecified abdominal pain: Secondary | ICD-10-CM | POA: Diagnosis not present

## 2015-12-31 DIAGNOSIS — R21 Rash and other nonspecific skin eruption: Secondary | ICD-10-CM

## 2015-12-31 NOTE — Progress Notes (Signed)
Pre visit review using our clinic review tool, if applicable. No additional management support is needed unless otherwise documented below in the visit note. 

## 2015-12-31 NOTE — Assessment & Plan Note (Signed)
Improvement with low dose hydrocortisone

## 2015-12-31 NOTE — Assessment & Plan Note (Signed)
Flare again to RUQ last weekend. Information provided regarding low fat diet. She is on week 1 of her pantoprazole per [facility]. Will have her update me if no improvement in 3-4 weeks. May need to consider ultrasound for re-evaluation of likely gallstones.

## 2015-12-31 NOTE — Assessment & Plan Note (Signed)
Td UTD. Pap UTD. Labs today with slightly elevated lipids, strong FH. Exam unremarkable. She leads a healthy lifestyle and exercises regularly. Fair diet overall. Discussed to limit fried foods.  Follow up in 1 year.

## 2015-12-31 NOTE — Assessment & Plan Note (Signed)
TC of 208, LDL of 131. Strong FH of hyperlipidemia. She is active and eats a fair diet. Will continue to monitor.

## 2015-12-31 NOTE — Progress Notes (Signed)
Subjective:    Patient ID: Debbie Bowen, female    DOB: 1981/06/15, 35 y.o.   MRN: [id number]  HPI  Debbie Bowen is a 34 year old female who presents today for complete physical.  Immunizations: -Tetanus: Completed in 2013 -Influenza: Did not receive last season  Diet: Endorses a fair diet Breakfast: Egg, fruit, smoothie Lunch: Grilled chicken, fried chicken tenders, occasional fries Dinner: Beef, chicken, salmon, vegetable, sweet potatoes Snacks: Pecans, cashews, dried fruit, yogurt Desserts: Every Friday  Beverages: Water, green tea, occasional juice  Exercise: Works out most days of the week at [employer]. Eye exam: Completed in January 2017 Dental exam: Completed in September 2016 Pap Smear: Completed in 2016   Review of Systems  HENT: Negative for rhinorrhea.   Respiratory: Negative for cough and shortness of breath.   Cardiovascular: Negative for chest pain.  Genitourinary: Negative for difficulty urinating.       Periods every 36 days  Musculoskeletal: Negative for myalgias and arthralgias.  Skin: Negative for rash.       Acne  Allergic/Immunologic: Positive for environmental allergies.  Neurological: Negative for dizziness, numbness and headaches.  Psychiatric/Behavioral:       Denies concerns for anxiety and depression       Past Medical History  Diagnosis Date  . UTI (lower urinary tract infection)     recurrent  . Insomnia   . BV (bacterial vaginosis)     recurrent  . Scoliosis     no issues  . GERD (gastroesophageal reflux disease)   . ADD (attention deficit disorder)   . Wears contact lenses   . Orthodontics     invisalign     Social History   Social History  . Marital Status: Single    Spouse Name: N/A  . Number of Children: N/A  . Years of Education: N/A   Occupational History  . Not on file.   Social History Main Topics  . Smoking status: Never Smoker   . Smokeless tobacco: Never Used  . Alcohol Use: 1.2 oz/week    2  Glasses of wine, 0 Standard drinks or equivalent per week     Comment: occasionally  . Drug Use: No  . Sexual Activity:    Partners: Male    Birth Control/ Protection: None   Other Topics Concern  . Not on file   Social History Narrative   Single.    No children.   Self Employed.   Enjoys projects around her house.     Past Surgical History  Procedure Laterality Date  . No past surgeries    . Esophagogastroduodenoscopy (egd) with propofol N/A 10/04/2015    Procedure: ESOPHAGOGASTRODUODENOSCOPY (EGD) WITH PROPOFOL;  Surgeon: Lucilla Lame, MD;  Location: [place];  Service: Endoscopy;  Laterality: N/A;    Family History  Problem Relation Age of Onset  . Cancer Mother 20    Breast one side BRCA negative  . Diabetes Mother   . Juvenile idiopathic arthritis Father   . Diabetes Maternal Aunt   . Diabetes Paternal Aunt   . Cancer Paternal Grandmother     Breast Cancer    No Known Allergies  Current Outpatient Prescriptions on File Prior to Visit  Medication Sig Dispense Refill  . amphetamine-dextroamphetamine (ADDERALL XR) 10 MG 24 hr capsule Take 10 mg by mouth as needed. Reported on 12/20/2015    . amphetamine-dextroamphetamine (ADDERALL XR) 15 MG 24 hr capsule Take 15 mg by mouth as needed. Reported on 12/20/2015    .  pantoprazole (PROTONIX) 40 MG tablet Take 1 tablet (40 mg total) by mouth daily. 30 tablet 4  . Prenatal MV-Min-Fe Fum-FA-DHA (PRENATAL 1 PO) Take 1 tablet by mouth daily.    . Probiotic Product (PROBIOTIC DAILY PO) Take by mouth daily.     No current facility-administered medications on file prior to visit.    BP 114/70 mmHg  Pulse 74  Temp(Src) 98.2 F (36.8 C) (Oral)  Ht 5' 6"  (1.676 m)  Wt 131 lb 6.4 oz (59.603 kg)  BMI 21.22 kg/m2  SpO2 97%  LMP 12/06/2015    Objective:   Physical Exam  Constitutional: She is oriented to person, place, and time. She appears well-nourished.  HENT:  Right Ear: Tympanic membrane and ear canal normal.    Left Ear: Tympanic membrane and ear canal normal.  Nose: Nose normal.  Mouth/Throat: Oropharynx is clear and moist.  Eyes: Conjunctivae and EOM are normal. Pupils are equal, round, and reactive to light.  Neck: Neck supple. No thyromegaly present.  Cardiovascular: Normal rate and regular rhythm.   No murmur heard. Pulmonary/Chest: Effort normal and breath sounds normal. She has no rales.  Abdominal: Soft. Bowel sounds are normal. There is no tenderness.  Musculoskeletal: Normal range of motion.  Lymphadenopathy:    She has no cervical adenopathy.  Neurological: She is alert and oriented to person, place, and time. She has normal reflexes. No cranial nerve deficit.  Skin: Skin is warm and dry. No rash noted.  Psychiatric: She has a normal mood and affect.          Assessment & Plan:

## 2015-12-31 NOTE — Patient Instructions (Signed)
Ensure you are reducing fried and fatty foods to help prevent gall bladder attacks.  Continue Pantoprazole as prescribed. Please notify me if no improvement in symptoms in 3-4 weeks.  Continue your healthy lifestyle through healthy diet and exercise.  Follow up in 1 year for repeat physical or sooner if needed.  It was a pleasure to see you today!  Low-Fat Diet for Pancreatitis or Gallbladder Conditions A low-fat diet can be helpful if you have pancreatitis or a gallbladder condition. With these conditions, your pancreas and gallbladder have trouble digesting fats. A healthy eating plan with less fat will help rest your pancreas and gallbladder and reduce your symptoms. WHAT DO I NEED TO KNOW ABOUT THIS DIET?  Eat a low-fat diet.  Reduce your fat intake to less than 20-30% of your total daily calories. This is less than 50-60 g of fat per day.  Remember that you need some fat in your diet. Ask your dietician what your daily goal should be.  Choose nonfat and low-fat healthy foods. Look for the words "nonfat," "low fat," or "fat free."  As a guide, look on the label and choose foods with less than 3 g of fat per serving. Eat only one serving.  Avoid alcohol.  Do not smoke. If you need help quitting, talk with your health care provider.  Eat small frequent meals instead of three large heavy meals. WHAT FOODS CAN I EAT? Grains Include healthy grains and starches such as potatoes, wheat bread, fiber-rich cereal, and brown rice. Choose whole grain options whenever possible. In adults, whole grains should account for 45-65% of your daily calories.  Fruits and Vegetables Eat plenty of fruits and vegetables. Fresh fruits and vegetables add fiber to your diet. Meats and Other Protein Sources Eat lean meat such as chicken and pork. Trim any fat off of meat before cooking it. Eggs, fish, and beans are other sources of protein. In adults, these foods should account for 10-35% of your daily  calories. Dairy Choose low-fat milk and dairy options. Dairy includes fat and protein, as well as calcium.  Fats and Oils Limit high-fat foods such as fried foods, sweets, baked goods, sugary drinks.  Other Creamy sauces and condiments, such as mayonnaise, can add extra fat. Think about whether or not you need to use them, or use smaller amounts or low fat options. WHAT FOODS ARE NOT RECOMMENDED?  High fat foods, such as:  [employer].  Ice cream.  [place] toast.  Sweet rolls.  Pizza.  Cheese bread.  Foods covered with batter, butter, creamy sauces, or cheese.  Fried foods.  Sugary drinks and desserts.  Foods that cause gas or bloating   This information is not intended to replace advice given to you by your health care provider. Make sure you discuss any questions you have with your health care provider.   Document Released: 08/23/2013 Document Reviewed: 08/23/2013 Elsevier Interactive Patient Education [employer].

## 2016-02-08 ENCOUNTER — Ambulatory Visit (INDEPENDENT_AMBULATORY_CARE_PROVIDER_SITE_OTHER): Payer: BLUE CROSS/BLUE SHIELD | Admitting: Psychology

## 2016-02-08 DIAGNOSIS — F908 Attention-deficit hyperactivity disorder, other type: Secondary | ICD-10-CM

## 2016-02-08 DIAGNOSIS — F4322 Adjustment disorder with anxiety: Secondary | ICD-10-CM

## 2016-02-08 DIAGNOSIS — F909 Attention-deficit hyperactivity disorder, unspecified type: Secondary | ICD-10-CM

## 2016-02-10 ENCOUNTER — Encounter: Payer: Self-pay | Admitting: Primary Care

## 2016-02-18 ENCOUNTER — Ambulatory Visit: Payer: Self-pay | Admitting: Primary Care

## 2016-02-18 ENCOUNTER — Ambulatory Visit (INDEPENDENT_AMBULATORY_CARE_PROVIDER_SITE_OTHER): Payer: BLUE CROSS/BLUE SHIELD | Admitting: Primary Care

## 2016-02-18 ENCOUNTER — Encounter: Payer: Self-pay | Admitting: Primary Care

## 2016-02-18 VITALS — BP 104/68 | HR 74 | Temp 98.6°F | Ht 66.0 in | Wt 132.4 lb

## 2016-02-18 DIAGNOSIS — F902 Attention-deficit hyperactivity disorder, combined type: Secondary | ICD-10-CM

## 2016-02-18 DIAGNOSIS — N390 Urinary tract infection, site not specified: Secondary | ICD-10-CM

## 2016-02-18 DIAGNOSIS — Z418 Encounter for other procedures for purposes other than remedying health state: Secondary | ICD-10-CM | POA: Diagnosis not present

## 2016-02-18 DIAGNOSIS — Z298 Encounter for other specified prophylactic measures: Secondary | ICD-10-CM

## 2016-02-18 MED ORDER — NITROFURANTOIN MONOHYD MACRO 100 MG PO CAPS: ORAL_CAPSULE | ORAL | Status: DC

## 2016-02-18 MED ORDER — AMPHETAMINE-DEXTROAMPHETAMINE 15 MG PO TABS: 15.0000 mg | ORAL_TABLET | Freq: Two times a day (BID) | ORAL | Status: DC

## 2016-02-18 NOTE — Assessment & Plan Note (Signed)
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 Didn't like the way she felt when she felt the Adderall XR medication wearing off, will try Adderall twice daily dosing and have her start with 1 tablet daily to see if this is enough to help with symptoms. She is to update [place] in several weeks in regards to new medication.

## 2016-02-18 NOTE — Patient Instructions (Addendum)
Start Adderall 15 mg tablets for ADHD. Take 1 tablet by mouth twice daily. Please e-mail me and keep me updated regarding the frequency of dosing as discussed.  I sent refills for your Macrobid to [facility].  It was a pleasure to see you today!

## 2016-02-18 NOTE — Assessment & Plan Note (Signed)
Refill provided today for Macrobid.

## 2016-02-18 NOTE — Progress Notes (Signed)
Pre visit review using our clinic review tool, if applicable. No additional management support is needed unless otherwise documented below in the visit note. 

## 2016-02-18 NOTE — Progress Notes (Signed)
Subjective:    Patient ID: Debbie Bowen, female    DOB: 29-Jul-1981, 35 y.o.   MRN: [id number]  HPI  Debbie Bowen is a 35 year old female who presents today to discuss ADHD results. She was formally tested by Dr. Lurline Hare with [facility], with positive results for ADHD with anxiety. She had been on Adderall XR in previous years, but didn't like the way this medication caused her to feel when she would calm down off the medication.   The Adderall caused her to feel "odd and out of it" when she came down. She was taking the Adderall sparingly during her work week (1-3 weekly on average). The adderall helped with concentration and completion of tasks at work. She currently takes Adderall XR 15 mg.  She does feel anxious with daily worry everyday. GAD 7 score of 14 today. She believes her anxiety is related to inability to concentrate and complete tasks at work.  Review of Systems  Respiratory: Negative for shortness of breath.   Cardiovascular: Negative for chest pain and palpitations.  Psychiatric/Behavioral: Positive for decreased concentration. The patient is nervous/anxious.        Past Medical History  Diagnosis Date  . UTI (lower urinary tract infection)     recurrent  . Insomnia   . BV (bacterial vaginosis)     recurrent  . Scoliosis     no issues  . GERD (gastroesophageal reflux disease)   . ADD (attention deficit disorder)   . Wears contact lenses   . Orthodontics     invisalign     Social History   Social History  . Marital Status: Single    Spouse Name: N/A  . Number of Children: N/A  . Years of Education: N/A   Occupational History  . Not on file.   Social History Main Topics  . Smoking status: Never Smoker   . Smokeless tobacco: Never Used  . Alcohol Use: 1.2 oz/week    2 Glasses of wine, 0 Standard drinks or equivalent per week     Comment: occasionally  . Drug Use: No  . Sexual Activity:    Partners: Male    Birth Control/  Protection: None   Other Topics Concern  . Not on file   Social History Narrative   Single.    No children.   Self Employed.   Enjoys projects around her house.     Past Surgical History  Procedure Laterality Date  . No past surgeries    . Esophagogastroduodenoscopy (egd) with propofol N/A 10/04/2015    Procedure: ESOPHAGOGASTRODUODENOSCOPY (EGD) WITH PROPOFOL;  Surgeon: Lucilla Lame, MD;  Location: [place];  Service: Endoscopy;  Laterality: N/A;    Family History  Problem Relation Age of Onset  . Cancer Mother 23    Breast one side BRCA negative  . Diabetes Mother   . Juvenile idiopathic arthritis Father   . Diabetes Maternal Aunt   . Diabetes Paternal Aunt   . Cancer Paternal Grandmother     Breast Cancer    No Known Allergies  Current Outpatient Prescriptions on File Prior to Visit  Medication Sig Dispense Refill  . pantoprazole (PROTONIX) 40 MG tablet Take 1 tablet (40 mg total) by mouth daily. 30 tablet 4  . Prenatal MV-Min-Fe Fum-FA-DHA (PRENATAL 1 PO) Take 1 tablet by mouth daily.    . Probiotic Product (PROBIOTIC DAILY PO) Take by mouth daily.     No current facility-administered medications on file prior  to visit.    BP 104/68 mmHg  Pulse 74  Temp(Src) 98.6 F (37 C) (Oral)  Ht 5' 6"  (1.676 m)  Wt 132 lb 6.4 oz (60.056 kg)  BMI 21.38 kg/m2  SpO2 96%  LMP 01/28/2016    Objective:   Physical Exam  Constitutional: She appears well-nourished.  Cardiovascular: Normal rate and regular rhythm.   Pulmonary/Chest: Effort normal and breath sounds normal.  Skin: Skin is warm.  Psychiatric: She has a normal mood and affect.          Assessment & Plan:

## 2016-03-03 ENCOUNTER — Telehealth: Payer: Self-pay | Admitting: Primary Care

## 2016-03-03 NOTE — Telephone Encounter (Signed)
-----   Message from Pleas Koch, NP sent at 02/18/2016  8:10 PM EDT ----- Regarding: ADHD Will you please check on Debbie Bowen and see how she is doing on her Adderall 15 mg twice daily? Does she feel on when the medication is wearing off?

## 2016-03-05 NOTE — Telephone Encounter (Signed)
Message left for patient to return my call.  

## 2016-03-05 NOTE — Telephone Encounter (Signed)
Pt returned your call  Best number [phone]

## 2016-03-05 NOTE — Telephone Encounter (Signed)
Noted  

## 2016-03-05 NOTE — Telephone Encounter (Signed)
Spoke with patient and she is only taking it QD due to how it makes her feel. She wants to take it slow. She is not taking it on the weekends. She continues to have a "weird" feeling almost like a nauseated feeling, but says it's hard to explain. She denies HA,CP, palpitations or dizziness.

## 2016-03-18 ENCOUNTER — Encounter: Payer: Self-pay | Admitting: Primary Care

## 2016-03-24 ENCOUNTER — Other Ambulatory Visit: Payer: Self-pay | Admitting: Primary Care

## 2016-03-24 DIAGNOSIS — F902 Attention-deficit hyperactivity disorder, combined type: Secondary | ICD-10-CM

## 2016-03-24 MED ORDER — ATOMOXETINE HCL 40 MG PO CAPS: 40.0000 mg | ORAL_CAPSULE | Freq: Every day | ORAL | 0 refills | Status: DC

## 2016-04-01 ENCOUNTER — Other Ambulatory Visit: Payer: Self-pay | Admitting: Primary Care

## 2016-04-01 ENCOUNTER — Encounter: Payer: Self-pay | Admitting: Primary Care

## 2016-04-01 DIAGNOSIS — F909 Attention-deficit hyperactivity disorder, unspecified type: Secondary | ICD-10-CM

## 2016-04-03 ENCOUNTER — Telehealth: Payer: Self-pay | Admitting: Primary Care

## 2016-04-03 NOTE — Telephone Encounter (Signed)
Pt placed on [facility] Wq. Pt was given phone number to office in [facility] for scheduling. Pt is aware of 2 counselors here at [facility] if she would like to schedule here.

## 2016-04-21 ENCOUNTER — Other Ambulatory Visit: Payer: Self-pay | Admitting: Primary Care

## 2016-04-21 DIAGNOSIS — F902 Attention-deficit hyperactivity disorder, combined type: Secondary | ICD-10-CM

## 2016-04-21 NOTE — Telephone Encounter (Signed)
Ok to refill? Electronically refill request for   atomoxetine (STRATTERA) 40 MG capsule  Last prescribed on 03/24/2016. Last seen on 02/18/2016. No future appt.

## 2016-04-22 ENCOUNTER — Encounter: Payer: Self-pay | Admitting: Primary Care

## 2016-05-12 ENCOUNTER — Ambulatory Visit (INDEPENDENT_AMBULATORY_CARE_PROVIDER_SITE_OTHER): Payer: BLUE CROSS/BLUE SHIELD | Admitting: Psychology

## 2016-05-12 DIAGNOSIS — F908 Attention-deficit hyperactivity disorder, other type: Secondary | ICD-10-CM

## 2016-05-12 DIAGNOSIS — F4322 Adjustment disorder with anxiety: Secondary | ICD-10-CM

## 2016-05-19 ENCOUNTER — Ambulatory Visit (INDEPENDENT_AMBULATORY_CARE_PROVIDER_SITE_OTHER): Payer: BLUE CROSS/BLUE SHIELD | Admitting: Allergy and Immunology

## 2016-05-19 ENCOUNTER — Encounter: Payer: Self-pay | Admitting: Allergy and Immunology

## 2016-05-19 VITALS — BP 126/70 | HR 86 | Temp 98.7°F | Resp 16 | Ht 65.55 in | Wt 130.0 lb

## 2016-05-19 DIAGNOSIS — H1010 Acute atopic conjunctivitis, unspecified eye: Secondary | ICD-10-CM | POA: Insufficient documentation

## 2016-05-19 DIAGNOSIS — H1013 Acute atopic conjunctivitis, bilateral: Secondary | ICD-10-CM

## 2016-05-19 DIAGNOSIS — L500 Allergic urticaria: Secondary | ICD-10-CM | POA: Diagnosis not present

## 2016-05-19 DIAGNOSIS — J3089 Other allergic rhinitis: Secondary | ICD-10-CM | POA: Insufficient documentation

## 2016-05-19 MED ORDER — LEVOCETIRIZINE DIHYDROCHLORIDE 5 MG PO TABS: 5.0000 mg | ORAL_TABLET | Freq: Every evening | ORAL | 5 refills | Status: DC

## 2016-05-19 MED ORDER — AZELASTINE-FLUTICASONE 137-50 MCG/ACT NA SUSP: 1.0000 | Freq: Two times a day (BID) | NASAL | 5 refills | Status: DC

## 2016-05-19 MED ORDER — OLOPATADINE HCL 0.2 % OP SOLN: 1.0000 [drp] | OPHTHALMIC | 5 refills | Status: DC

## 2016-05-19 NOTE — Progress Notes (Signed)
New Patient Note  RE: Debbie Bowen MRN: [id number] DOB: 1981/02/18 Date of Office Visit: 05/19/2016  Referring provider: Pleas Koch, NP Primary care provider: Sheral Flow, NP  Chief Complaint: Rash; Allergic Rhinitis ; and Conjunctivitis   History of present illness: Debbie Bowen is a 35 y.o. female presenting today for consultation of dermatitis and rhinitis.  Over the past 3 years, Debbie Bowen has experienced occasional episodes of a rash on her lips.  The rash is described as very small bumps, occasionally mildly pruritic.  She denies vesicles or urticaria.  The bumps typically last for 1-2 weeks prior to resolving.  She does not experience concomitant cardiopulmonary or other GI symptoms.  No specific medication, food, or environmental triggers have been identified.   Debbie Bowen experiences nasal congestion, rhinorrhea, sneezing, postnasal drainage, and ocular pruritus.  The symptoms occur year around but may be more frequent in the springtime in the fall.  She takes cetirizine and fluticasone nasal spray as needed in an attempt to control these symptoms.   Assessment and plan: Dermatitis Unclear etiology.  Food allergen skin tests today were negative despite a positive histamine control.  The negative predictive value for food allergen skin testing is excellent (approximately 95%).  Should significant symptoms recur or new symptoms occur, a journal is to be kept recording any foods eaten, beverages consumed, medications taken, activities performed, and environmental conditions within a 6 hour time period prior to the onset of symptoms. Special attention should be paid to acidic foods or foods with high salicylate content. A list of foods with high salicylate content has been provided. For any symptoms concerning for anaphylaxis, 911 is to be called immediately.   If dermatitis persists or progresses, dermatology evaluation may be  helpful.  Perennial allergic rhinitis  Aeroallergen avoidance measures have been discussed and provided in written form.  A prescription has been provided for levocetirizine, 15m daily as needed.  A prescription has been provided for Dymista (azelastine/fluticasone) nasal spray, 1 spray per nostril twice daily as needed. Proper nasal spray technique has been discussed and demonstrated.  If allergen avoidance measures and medications fail to adequately relieve symptoms, aeroallergen immunotherapy will be considered.  Allergic conjunctivitis  Treatment plan as outlined above for allergic rhinitis.  A prescription has been provided for Pazeo, one drop per eye daily as needed.   Meds ordered this encounter  Medications  . levocetirizine (XYZAL) 5 MG tablet    Sig: Take 1 tablet (5 mg total) by mouth every evening.    Dispense:  30 tablet    Refill:  5  . Azelastine-Fluticasone (DYMISTA) 137-50 MCG/ACT SUSP    Sig: Place 1 spray into both nostrils 2 (two) times daily.    Dispense:  1 Bottle    Refill:  5  . Olopatadine HCl (PATADAY) 0.2 % SOLN    Sig: Place 1 drop into both eyes 1 day or 1 dose.    Dispense:  1 Bottle    Refill:  5    Diagnositics: Epicutaneous testing: Positive to dust mite antigen. Intradermal testing: Positive to grass pollens, weed pollens, tree pollens, and molds. Food allergen skin testing:  Negative despite a positive histamine control.    Physical examination: Blood pressure 126/70, pulse 86, temperature 98.7 F (37.1 C), temperature source Oral, resp. rate 16, height 5' 5.55" (1.665 m), weight 130 lb (59 kg), SpO2 98 %.  General: Alert, interactive, in no acute distress. HEENT: TMs pearly gray, turbinates moderately edematous without discharge,  post-pharynx moderately erythematous. Neck: Supple without lymphadenopathy. Lungs: Clear to auscultation without wheezing, rhonchi or rales. CV: Normal S1, S2 without murmurs. Abdomen: Nondistended,  nontender. Skin: Warm and dry, without lesions or rashes. Extremities:  No clubbing, cyanosis or edema. Neuro:   Grossly intact.  Review of systems:  Review of systems negative except as noted in HPI / PMHx or noted below: Review of Systems  Constitutional: Negative.   HENT: Negative.   Eyes: Negative.   Respiratory: Negative.   Cardiovascular: Negative.   Gastrointestinal: Negative.   Genitourinary: Negative.   Musculoskeletal: Negative.   Skin: Negative.   Neurological: Negative.   Endo/Heme/Allergies: Negative.   Psychiatric/Behavioral: Negative.     Past medical history:  Past Medical History:  Diagnosis Date  . ADD (attention deficit disorder)   . BV (bacterial vaginosis)    recurrent  . Eczema   . GERD (gastroesophageal reflux disease)   . Insomnia   . Orthodontics    invisalign  . Scoliosis    no issues  . Urticaria   . UTI (lower urinary tract infection)    recurrent  . Wears contact lenses     Past surgical history:  Past Surgical History:  Procedure Laterality Date  . ESOPHAGOGASTRODUODENOSCOPY (EGD) WITH PROPOFOL N/A 10/04/2015   Procedure: ESOPHAGOGASTRODUODENOSCOPY (EGD) WITH PROPOFOL;  Surgeon: Lucilla Lame, MD;  Location: [place];  Service: Endoscopy;  Laterality: N/A;  . NO PAST SURGERIES      Family history: Family History  Problem Relation Age of Onset  . Cancer Mother 33    Breast one side BRCA negative  . Diabetes Mother   . Juvenile idiopathic arthritis Father   . Eczema Father   . Diabetes Maternal Aunt   . Diabetes Paternal Aunt   . Cancer Paternal Grandmother     Breast Cancer  . Asthma Sister   . Asthma Brother   . Urticaria Neg Hx   . Allergic rhinitis Neg Hx   . Angioedema Neg Hx   . Immunodeficiency Neg Hx     Social history: Social History   Social History  . Marital status: Single    Spouse name: N/A  . Number of children: N/A  . Years of education: N/A   Occupational History  . Not on file.    Social History Main Topics  . Smoking status: Never Smoker  . Smokeless tobacco: Never Used  . Alcohol use 1.2 oz/week    2 Glasses of wine per week     Comment: occasionally  . Drug use: No  . Sexual activity: Yes    Partners: Male    Birth control/ protection: None   Other Topics Concern  . Not on file   Social History Narrative   Single.    No children.   Self Employed.   Enjoys projects around her house.    Environmental History: The patient lives in a 68-year-old house with carpeting throughout and central air/heat.  She is a nonsmoker without pets.    Medication List       Accurate as of 05/19/16  4:03 PM. Always use your most recent med list.          atomoxetine 40 MG capsule Commonly known as:  STRATTERA Take 1 capsule (40 mg total) by mouth daily.   Azelastine-Fluticasone 137-50 MCG/ACT Susp Commonly known as:  DYMISTA Place 1 spray into both nostrils 2 (two) times daily.   levocetirizine 5 MG tablet Commonly known as:  XYZAL Take 1 tablet (  5 mg total) by mouth every evening.   nitrofurantoin (macrocrystal-monohydrate) 100 MG capsule Commonly known as:  MACROBID Take 1 tablet by mouth daily as needed.   Olopatadine HCl 0.2 % Soln Commonly known as:  PATADAY Place 1 drop into both eyes 1 day or 1 dose.   pantoprazole 40 MG tablet Commonly known as:  PROTONIX Take 1 tablet (40 mg total) by mouth daily.   PRENATAL 1 PO Take 1 tablet by mouth daily.   PROBIOTIC DAILY PO Take by mouth daily.       Known medication allergies: No Known Allergies  I appreciate the opportunity to take part in Kaylina's care. Please do not hesitate to contact me with questions.  Sincerely,   R. Edgar Frisk, MD

## 2016-05-19 NOTE — Assessment & Plan Note (Addendum)
Unclear etiology.  Food allergen skin tests today were negative despite a positive histamine control.  The negative predictive value for food allergen skin testing is excellent (approximately 95%).  Should significant symptoms recur or new symptoms occur, a journal is to be kept recording any foods eaten, beverages consumed, medications taken, activities performed, and environmental conditions within a 6 hour time period prior to the onset of symptoms. Special attention should be paid to acidic foods or foods with high salicylate content. A list of foods with high salicylate content has been provided. For any symptoms concerning for anaphylaxis, 911 is to be called immediately.   If dermatitis persists or progresses, dermatology evaluation may be helpful.

## 2016-05-19 NOTE — Assessment & Plan Note (Signed)
   Aeroallergen avoidance measures have been discussed and provided in written form.  A prescription has been provided for levocetirizine, 5mg  daily as needed.  A prescription has been provided for Dymista (azelastine/fluticasone) nasal spray, 1 spray per nostril twice daily as needed. Proper nasal spray technique has been discussed and demonstrated.  If allergen avoidance measures and medications fail to adequately relieve symptoms, aeroallergen immunotherapy will be considered.

## 2016-05-19 NOTE — Patient Instructions (Addendum)
Dermatitis Unclear etiology.  Food allergen skin tests today were negative despite a positive histamine control.  The negative predictive value for food allergen skin testing is excellent (approximately 95%).  Should significant symptoms recur or new symptoms occur, a journal is to be kept recording any foods eaten, beverages consumed, medications taken, activities performed, and environmental conditions within a 6 hour time period prior to the onset of symptoms. Special attention should be paid to acidic foods or foods with high salicylate content. A list of foods with high salicylate content has been provided. For any symptoms concerning for anaphylaxis, 911 is to be called immediately.   If dermatitis persists or progresses, dermatology evaluation may be helpful.  Perennial allergic rhinitis  Aeroallergen avoidance measures have been discussed and provided in written form.  A prescription has been provided for levocetirizine, 5mg  daily as needed.  A prescription has been provided for Dymista (azelastine/fluticasone) nasal spray, 1 spray per nostril twice daily as needed. Proper nasal spray technique has been discussed and demonstrated.  If allergen avoidance measures and medications fail to adequately relieve symptoms, aeroallergen immunotherapy will be considered.  Allergic conjunctivitis  Treatment plan as outlined above for allergic rhinitis.  A prescription has been provided for Pazeo, one drop per eye daily as needed.   Return in about 6 months (around 11/16/2016), or if symptoms worsen or fail to improve.

## 2016-05-19 NOTE — Assessment & Plan Note (Signed)
   Treatment plan as outlined above for allergic rhinitis.  A prescription has been provided for Pazeo, one drop per eye daily as needed. 

## 2016-05-26 ENCOUNTER — Ambulatory Visit (INDEPENDENT_AMBULATORY_CARE_PROVIDER_SITE_OTHER): Payer: BLUE CROSS/BLUE SHIELD | Admitting: Psychology

## 2016-05-26 DIAGNOSIS — F908 Attention-deficit hyperactivity disorder, other type: Secondary | ICD-10-CM | POA: Diagnosis not present

## 2016-05-26 DIAGNOSIS — F4322 Adjustment disorder with anxiety: Secondary | ICD-10-CM

## 2016-06-12 ENCOUNTER — Encounter: Payer: Self-pay | Admitting: Primary Care

## 2016-06-12 ENCOUNTER — Ambulatory Visit: Payer: BLUE CROSS/BLUE SHIELD | Admitting: Psychology

## 2016-07-04 ENCOUNTER — Telehealth: Payer: Self-pay | Admitting: *Deleted

## 2016-07-04 DIAGNOSIS — B379 Candidiasis, unspecified: Secondary | ICD-10-CM

## 2016-07-04 MED ORDER — FLUCONAZOLE 150 MG PO TABS: 150.0000 mg | ORAL_TABLET | Freq: Once | ORAL | 0 refills | Status: AC

## 2016-07-04 NOTE — Telephone Encounter (Signed)
Pt is experiencing a thick white discharge, denies any odor and states when she has a yeast infection she usually experiences a white discharge and not much vaginal itching. Sent Diflucan to pharmacy instructed pt on medication use.

## 2016-07-04 NOTE — Telephone Encounter (Signed)
-----   Message from Francia Greaves sent at 07/04/2016  9:56 AM EDT ----- Regarding: Rx Request Contact: [phone] Thinks she has a yeast infection Uses [facility] in [place]

## 2017-03-12 ENCOUNTER — Telehealth: Payer: Self-pay | Admitting: *Deleted

## 2017-03-12 DIAGNOSIS — N760 Acute vaginitis: Principal | ICD-10-CM

## 2017-03-12 DIAGNOSIS — B9689 Other specified bacterial agents as the cause of diseases classified elsewhere: Secondary | ICD-10-CM

## 2017-03-12 MED ORDER — METRONIDAZOLE 500 MG PO TABS: 500.0000 mg | ORAL_TABLET | Freq: Two times a day (BID) | ORAL | 0 refills | Status: DC

## 2017-03-12 NOTE — Telephone Encounter (Signed)
Pt called the office c/o vaginal discharge with odor persistent with BV.  Would also like to schedule an appt with Dr Kennon Rounds.  Flagyl sent to pharmacy and instructed on medication use and scheduled annual exam for 04-14-17.

## 2017-03-24 ENCOUNTER — Other Ambulatory Visit: Payer: Self-pay | Admitting: Primary Care

## 2017-03-24 DIAGNOSIS — Z298 Encounter for other specified prophylactic measures: Secondary | ICD-10-CM

## 2017-03-25 NOTE — Telephone Encounter (Signed)
Ok to refill? Electronically refill request for nitrofurantoin, macrocrystal-monohydrate, (MACROBID) 100 MG capsule  Need for prophylaxis against urinary tract infection  Last prescribed and seen on  02/18/2016.

## 2017-03-25 NOTE — Telephone Encounter (Signed)
Needs office visit.

## 2017-03-25 NOTE — Telephone Encounter (Signed)
Per DPR, left detail message of Kate's comments for patient to call back. 

## 2017-03-31 NOTE — Telephone Encounter (Signed)
Spoken to patient and this was supposed to be sent to her GYN

## 2017-04-10 NOTE — Progress Notes (Deleted)
Last pap - 08/2014 - normal Needs TDAP

## 2017-04-14 ENCOUNTER — Ambulatory Visit: Payer: Self-pay | Admitting: Family Medicine

## 2017-05-10 NOTE — Progress Notes (Deleted)
Last pap 08/22/2014 - Normal Had fertility w/u and referral to [facility] last visit.

## 2017-05-11 ENCOUNTER — Telehealth: Payer: Self-pay | Admitting: Radiology

## 2017-05-11 ENCOUNTER — Ambulatory Visit: Payer: Self-pay | Admitting: Family Medicine

## 2017-05-11 NOTE — Telephone Encounter (Signed)
Patient called after hours, states that she wanted to reschedule her appointment for 05/11/17 foe Annual Exam, Appointment cancelled, left message on the cell phone voicemail to call back to [id number] so that the appointment can be rescheduled.

## 2017-05-26 ENCOUNTER — Encounter: Payer: Self-pay | Admitting: Obstetrics & Gynecology

## 2017-05-26 ENCOUNTER — Ambulatory Visit (INDEPENDENT_AMBULATORY_CARE_PROVIDER_SITE_OTHER): Payer: BLUE CROSS/BLUE SHIELD | Admitting: Obstetrics & Gynecology

## 2017-05-26 VITALS — BP 116/78 | HR 75 | Ht 67.0 in | Wt 126.6 lb

## 2017-05-26 DIAGNOSIS — Z124 Encounter for screening for malignant neoplasm of cervix: Secondary | ICD-10-CM

## 2017-05-26 DIAGNOSIS — Z01419 Encounter for gynecological examination (general) (routine) without abnormal findings: Secondary | ICD-10-CM

## 2017-05-26 DIAGNOSIS — Z1151 Encounter for screening for human papillomavirus (HPV): Secondary | ICD-10-CM

## 2017-05-26 DIAGNOSIS — N760 Acute vaginitis: Secondary | ICD-10-CM | POA: Diagnosis not present

## 2017-05-26 DIAGNOSIS — N390 Urinary tract infection, site not specified: Secondary | ICD-10-CM

## 2017-05-26 DIAGNOSIS — Z113 Encounter for screening for infections with a predominantly sexual mode of transmission: Secondary | ICD-10-CM | POA: Diagnosis not present

## 2017-05-26 DIAGNOSIS — Z298 Encounter for other specified prophylactic measures: Secondary | ICD-10-CM

## 2017-05-26 MED ORDER — NITROFURANTOIN MONOHYD MACRO 100 MG PO CAPS: ORAL_CAPSULE | ORAL | 5 refills | Status: DC

## 2017-05-26 MED ORDER — BORIC ACID CRYS: 600.0000 mg | CRYSTALS | Freq: Every day | 5 refills | Status: DC

## 2017-05-26 NOTE — Progress Notes (Signed)
GYNECOLOGY ANNUAL PREVENTATIVE CARE ENCOUNTER NOTE  Subjective:   Debbie Bowen is a 36 y.o. [id number] female here for a routine annual gynecologic exam.  Current complaints: desires evaluation of vaginal discharge given history of recurrent BV, no current symptoms.  Also wants refill of Macrobid used for recurrent UTI.    Denies abnormal vaginal bleeding, discharge, pelvic pain, problems with intercourse or other gynecologic concerns.    Gynecologic History Patient's last menstrual period was 05/11/2017 (exact date). Contraception: none Last Pap: 2016. Results were: normal  Obstetric History OB History  Gravida Para Term Preterm AB Living  1   0 0 1 0  SAB TAB Ectopic Multiple Live Births    1          # Outcome Date GA Lbr Len/2nd Weight Sex Delivery Anes PTL Lv  1 TAB               Past Medical History:  Diagnosis Date  . ADD (attention deficit disorder)    No medications  . BV (bacterial vaginosis)    recurrent  . Eczema   . GERD (gastroesophageal reflux disease)   . Insomnia   . Orthodontics    invisalign  . Scoliosis    no issues  . Urticaria   . UTI (lower urinary tract infection)    recurrent  . Wears contact lenses     Past Surgical History:  Procedure Laterality Date  . ESOPHAGOGASTRODUODENOSCOPY (EGD) WITH PROPOFOL N/A 10/04/2015   Procedure: ESOPHAGOGASTRODUODENOSCOPY (EGD) WITH PROPOFOL;  Surgeon: Lucilla Lame, MD;  Location: [facility];  Service: Endoscopy;  Laterality: N/A;  . NO PAST SURGERIES      Current Outpatient Prescriptions on File Prior to Visit  Medication Sig Dispense Refill  . metroNIDAZOLE (FLAGYL) 500 MG tablet Take 1 tablet (500 mg total) by mouth 2 (two) times daily. 14 tablet 0  . Probiotic Product (PROBIOTIC DAILY PO) Take by mouth daily.     No current facility-administered medications on file prior to visit.     No Known Allergies  Social History   Social History  . Marital status: Single    Spouse  name: N/A  . Number of children: N/A  . Years of education: 61   Occupational History  . Operations manager     [employer]   Social History Main Topics  . Smoking status: Never Smoker  . Smokeless tobacco: Never Used  . Alcohol use 1.2 oz/week    2 Glasses of wine per week     Comment: occasionally  . Drug use: No  . Sexual activity: Yes    Partners: Male    Birth control/ protection: None   Other Topics Concern  . Not on file   Social History Narrative   Single.    No children.   Self Employed.   Enjoys projects around her house.     Family History  Problem Relation Age of Onset  . Diabetes Mother   . Breast cancer Mother 52       Breast one side BRCA negative. Triple negative  . Juvenile idiopathic arthritis Father   . Eczema Father   . Diabetes Maternal Aunt   . Diabetes Paternal Aunt   . Breast cancer Paternal Grandmother   . Diabetes Paternal Grandmother   . Asthma Sister   . Asthma Brother   . Diabetes Maternal Grandmother   . Prostate cancer Maternal Grandfather   . Prostate cancer Paternal Grandfather   .  Breast cancer Paternal Aunt 50  . Breast cancer Paternal Aunt 47  . Urticaria Neg Hx   . Allergic rhinitis Neg Hx   . Angioedema Neg Hx   . Immunodeficiency Neg Hx     The following portions of the patient's history were reviewed and updated as appropriate: allergies, current medications, past family history, past medical history, past social history, past surgical history and problem list.  Review of Systems Pertinent items noted in HPI and remainder of comprehensive ROS otherwise negative.   Objective:  BP 116/78   Pulse 75   Ht 5' 7"  (1.702 m)   Wt 126 lb 9.6 oz (57.4 kg)   LMP 05/11/2017 (Exact Date)   BMI 19.83 kg/m  CONSTITUTIONAL: Well-developed, well-nourished female in no acute distress.  HENT:  Normocephalic, atraumatic, External right and left ear normal. Oropharynx is clear and moist EYES: Conjunctivae and EOM are  normal. Pupils are equal, round, and reactive to light. No scleral icterus.  NECK: Normal range of motion, supple, no masses.  Normal thyroid.  SKIN: Skin is warm and dry. No rash noted. Not diaphoretic. No erythema. No pallor. NEUROLOGIC: Alert and oriented to person, place, and time. Normal reflexes, muscle tone coordination. No cranial nerve deficit noted. PSYCHIATRIC: Normal mood and affect. Normal behavior. Normal judgment and thought content. CARDIOVASCULAR: Normal heart rate noted, regular rhythm RESPIRATORY: Clear to auscultation bilaterally. Effort and breath sounds normal, no problems with respiration noted. BREASTS: Symmetric in size. No masses, skin changes, nipple drainage, or lymphadenopathy. ABDOMEN: Soft, normal bowel sounds, no distention noted.  No tenderness, rebound or guarding.  PELVIC: Normal appearing external genitalia; normal appearing vaginal mucosa and cervix.  White discharge noted, sample obtained.  Pap smear obtained, mild bleeding after pap.  Normal uterine size, no other palpable masses, no uterine or adnexal tenderness. MUSCULOSKELETAL: Normal range of motion. No tenderness.  No cyanosis, clubbing, or edema.  2+ distal pulses.   Assessment and Plan:  1. Encounter for gynecological examination with Papanicolaou smear of cervix - Cytology - PAP done. Will follow up results of pap smear and manage accordingly.  2. Recurrent vaginitis - Cervicovaginal ancillary only done today. If positive, will do extended metronidazole regimen. - Boric Acid CRYS; Place 600 mg vaginally at bedtime. Use vaginally every night for two weeks then twice a week  Dispense: 500 g; Refill: 5. This was recommended to help reestablish proper vaginal pH balance.  3. Recurrent UTI 4. Need for prophylaxis against urinary tract infection Requested Macrobid - nitrofurantoin, macrocrystal-monohydrate, (MACROBID) 100 MG capsule; Take 1 tablet by mouth daily as needed.  Dispense: 30 capsule;  Refill: 5  Routine preventative health maintenance measures emphasized. Please refer to After Visit Summary for other counseling recommendations.    Verita Schneiders, MD, [place] Attending Obstetrician & Gynecologist, [place] for [facility]

## 2017-05-26 NOTE — Patient Instructions (Signed)
Preventive Care 18-39 Years, Female Preventive care refers to lifestyle choices and visits with your health care provider that can promote health and wellness. What does preventive care include?  A yearly physical exam. This is also called an annual well check.  Dental exams once or twice a year.  Routine eye exams. Ask your health care provider how often you should have your eyes checked.  Personal lifestyle choices, including: ? Daily care of your teeth and gums. ? Regular physical activity. ? Eating a healthy diet. ? Avoiding tobacco and drug use. ? Limiting alcohol use. ? Practicing safe sex. ? Taking vitamin and mineral supplements as recommended by your health care provider. What happens during an annual well check? The services and screenings done by your health care provider during your annual well check will depend on your age, overall health, lifestyle risk factors, and family history of disease. Counseling Your health care provider may ask you questions about your:  Alcohol use.  Tobacco use.  Drug use.  Emotional well-being.  Home and relationship well-being.  Sexual activity.  Eating habits.  Work and work Statistician.  Method of birth control.  Menstrual cycle.  Pregnancy history.  Screening You may have the following tests or measurements:  Height, weight, and BMI.  Diabetes screening. This is done by checking your blood sugar (glucose) after you have not eaten for a while (fasting).  Blood pressure.  Lipid and cholesterol levels. These may be checked every 5 years starting at age 66.  Skin check.  Hepatitis C blood test.  Hepatitis B blood test.  Sexually transmitted disease (STD) testing.  BRCA-related cancer screening. This may be done if you have a family history of breast, ovarian, tubal, or peritoneal cancers.  Pelvic exam and Pap test. This may be done every 3 years starting at age 40. Starting at age 59, this may be done every 5  years if you have a Pap test in combination with an HPV test.  Discuss your test results, treatment options, and if necessary, the need for more tests with your health care provider. Vaccines Your health care provider may recommend certain vaccines, such as:  Influenza vaccine. This is recommended every year.  Tetanus, diphtheria, and acellular pertussis (Tdap, Td) vaccine. You may need a Td booster every 10 years.  Varicella vaccine. You may need this if you have not been vaccinated.  HPV vaccine. If you are 69 or younger, you may need three doses over 6 months.  Measles, mumps, and rubella (MMR) vaccine. You may need at least one dose of MMR. You may also need a second dose.  Pneumococcal 13-valent conjugate (PCV13) vaccine. You may need this if you have certain conditions and were not previously vaccinated.  Pneumococcal polysaccharide (PPSV23) vaccine. You may need one or two doses if you smoke cigarettes or if you have certain conditions.  Meningococcal vaccine. One dose is recommended if you are age 27-21 years and a first-year college student living in a residence hall, or if you have one of several medical conditions. You may also need additional booster doses.  Hepatitis A vaccine. You may need this if you have certain conditions or if you travel or work in places where you may be exposed to hepatitis A.  Hepatitis B vaccine. You may need this if you have certain conditions or if you travel or work in places where you may be exposed to hepatitis B.  Haemophilus influenzae type b (Hib) vaccine. You may need this if  you have certain risk factors.  Talk to your health care provider about which screenings and vaccines you need and how often you need them. This information is not intended to replace advice given to you by your health care provider. Make sure you discuss any questions you have with your health care provider. Document Released: 10/14/2001 Document Revised: 05/07/2016  Document Reviewed: 06/19/2015 Elsevier Interactive Patient Education  2017 [employer].

## 2017-05-28 LAB — CERVICOVAGINAL ANCILLARY ONLY
BACTERIAL VAGINITIS: NEGATIVE
Candida vaginitis: NEGATIVE
Chlamydia: NEGATIVE
NEISSERIA GONORRHEA: NEGATIVE
TRICH (WINDOWPATH): NEGATIVE

## 2017-06-01 LAB — CYTOLOGY - PAP
Diagnosis: NEGATIVE
HPV (WINDOPATH): NOT DETECTED

## 2017-06-05 ENCOUNTER — Ambulatory Visit (INDEPENDENT_AMBULATORY_CARE_PROVIDER_SITE_OTHER): Payer: BLUE CROSS/BLUE SHIELD | Admitting: Primary Care

## 2017-06-05 ENCOUNTER — Encounter: Payer: Self-pay | Admitting: Primary Care

## 2017-06-05 ENCOUNTER — Ambulatory Visit (INDEPENDENT_AMBULATORY_CARE_PROVIDER_SITE_OTHER)
Admission: RE | Admit: 2017-06-05 | Discharge: 2017-06-05 | Disposition: A | Discharge status: Home / Self Care | Payer: BLUE CROSS/BLUE SHIELD | Source: Ambulatory Visit | Attending: Primary Care | Admitting: Primary Care

## 2017-06-05 VITALS — BP 118/78 | HR 60 | Temp 98.1°F | Ht 67.0 in | Wt 126.8 lb

## 2017-06-05 DIAGNOSIS — G8929 Other chronic pain: Secondary | ICD-10-CM

## 2017-06-05 DIAGNOSIS — Z0000 Encounter for general adult medical examination without abnormal findings: Secondary | ICD-10-CM | POA: Diagnosis not present

## 2017-06-05 DIAGNOSIS — M545 Low back pain, unspecified: Secondary | ICD-10-CM | POA: Insufficient documentation

## 2017-06-05 LAB — COMPREHENSIVE METABOLIC PANEL
ALK PHOS: 47 U/L (ref 39–117)
ALT: 10 U/L (ref 0–35)
AST: 12 U/L (ref 0–37)
Albumin: 4.1 g/dL (ref 3.5–5.2)
BUN: 9 mg/dL (ref 6–23)
CO2: 29 meq/L (ref 19–32)
Calcium: 9.3 mg/dL (ref 8.4–10.5)
Chloride: 104 mEq/L (ref 96–112)
Creatinine, Ser: 0.8 mg/dL (ref 0.40–1.20)
GFR: 103.86 mL/min (ref >60.00)
GLUCOSE: 102 mg/dL — AB (ref 70–99)
POTASSIUM: 4.6 meq/L (ref 3.5–5.1)
Sodium: 139 mEq/L (ref 135–145)
Total Bilirubin: 0.3 mg/dL (ref 0.2–1.2)
Total Protein: 7.7 g/dL (ref 6.0–8.3)

## 2017-06-05 LAB — LIPID PANEL
Cholesterol: 196 mg/dL (ref 0–200)
HDL: 55.8 mg/dL (ref >39.00)
LDL Cholesterol: 131 mg/dL — ABNORMAL HIGH (ref 0–99)
NONHDL: 140.39
Total CHOL/HDL Ratio: 4
Triglycerides: 47 mg/dL (ref 0.0–149.0)
VLDL: 9.4 mg/dL (ref 0.0–40.0)

## 2017-06-05 LAB — CBC
HCT: 39.9 % (ref 36.0–46.0)
HEMOGLOBIN: 12.9 g/dL (ref 12.0–15.0)
MCHC: 32.4 g/dL (ref 30.0–36.0)
MCV: 85.9 fl (ref 78.0–100.0)
Platelets: 238 10*3/uL (ref 150.0–400.0)
RBC: 4.64 Mil/uL (ref 3.87–5.11)
RDW: 13.4 % (ref 11.5–15.5)
WBC: 3.5 10*3/uL — ABNORMAL LOW (ref 4.0–10.5)

## 2017-06-05 IMAGING — DX DG LUMBAR SPINE 2-3V
3 series · 3 of 3 positions shown · non-contrast
Comparison: CT Abdomen and Pelvis [DATE].

CLINICAL DATA: 35-year-old female with lumbar back pain for about 1
year, primarily midline.

EXAM:
LUMBAR SPINE - 2-3 VIEW

[l-spine ap]
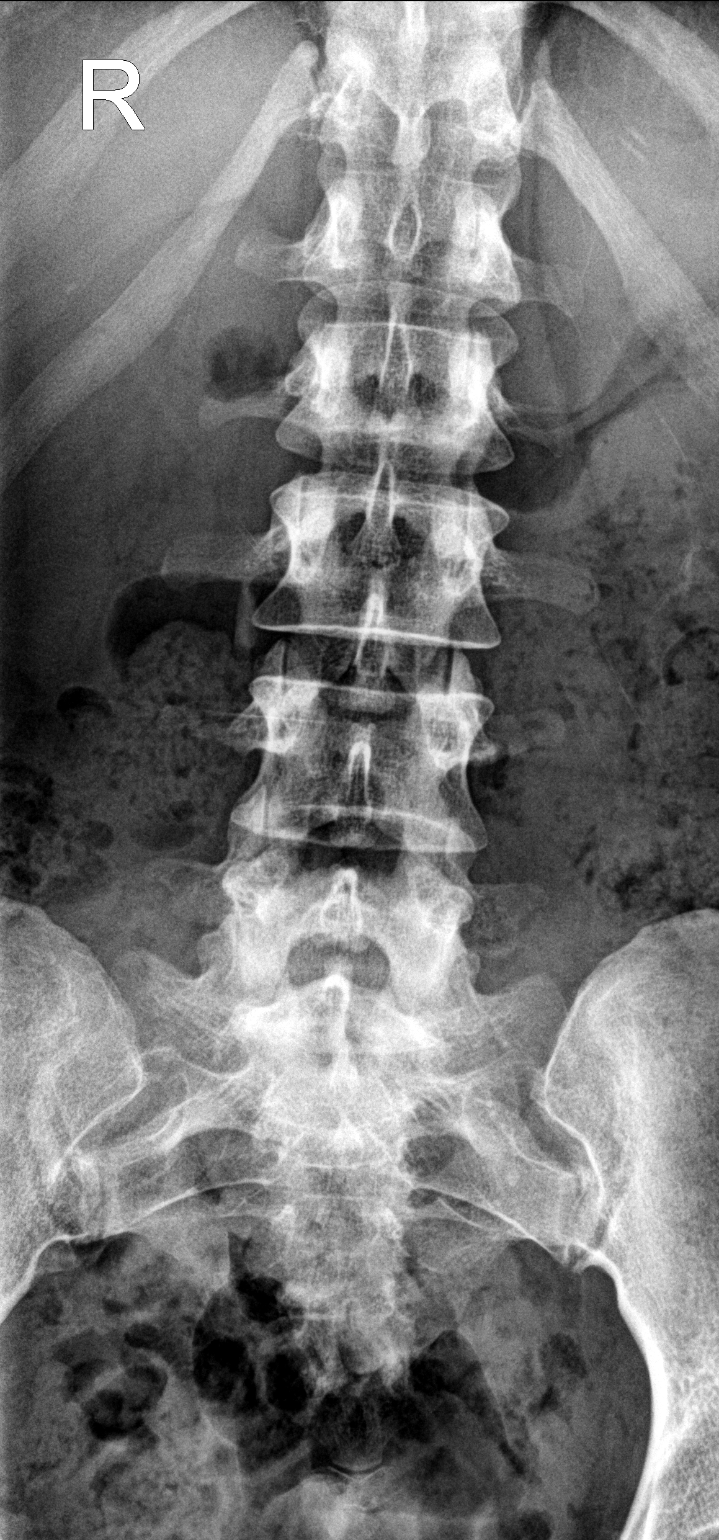

[l-spine lat]
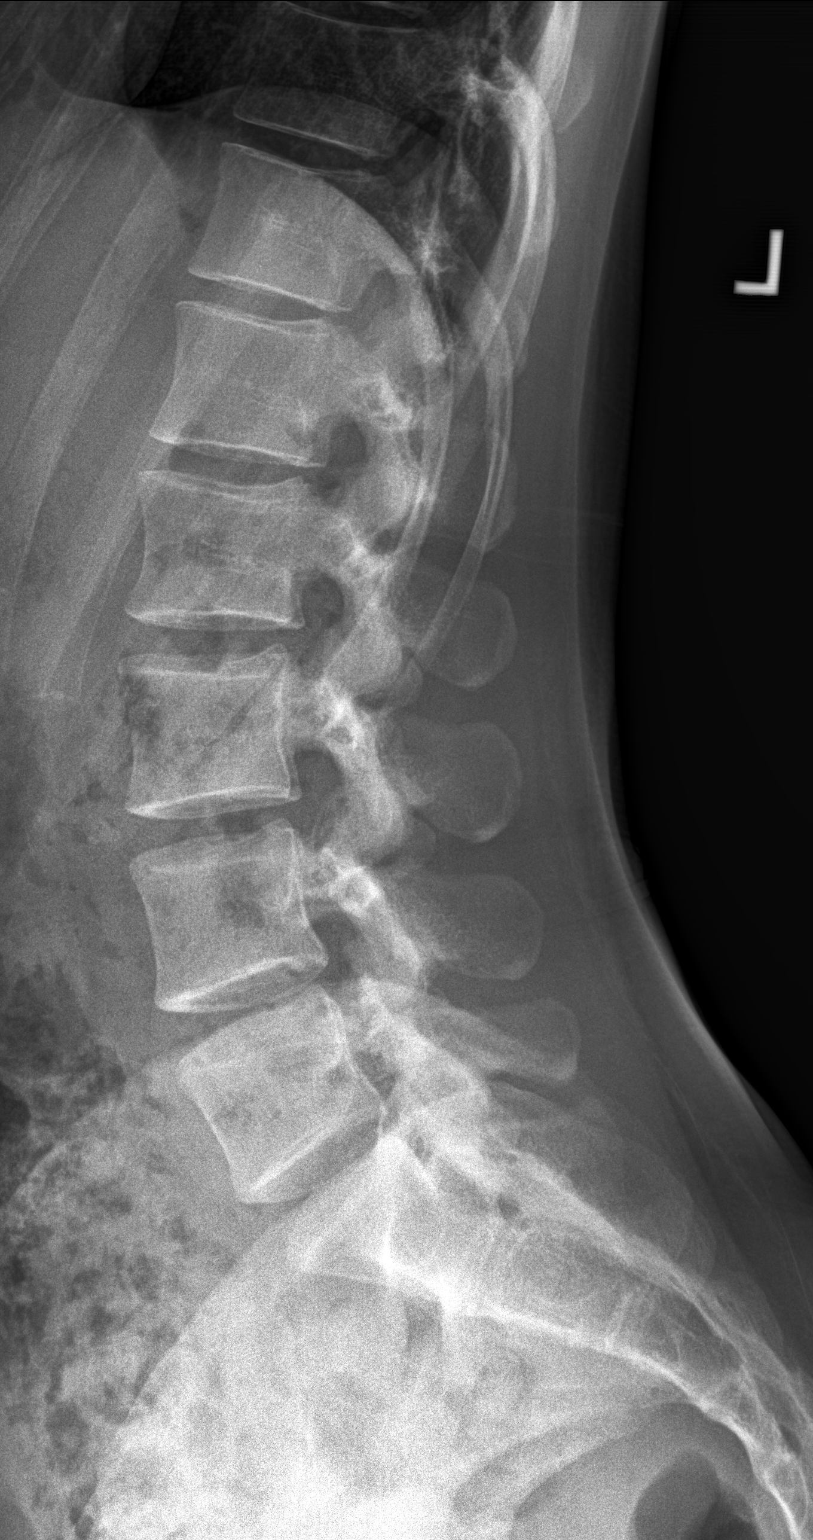

[l-spine l5/s1]
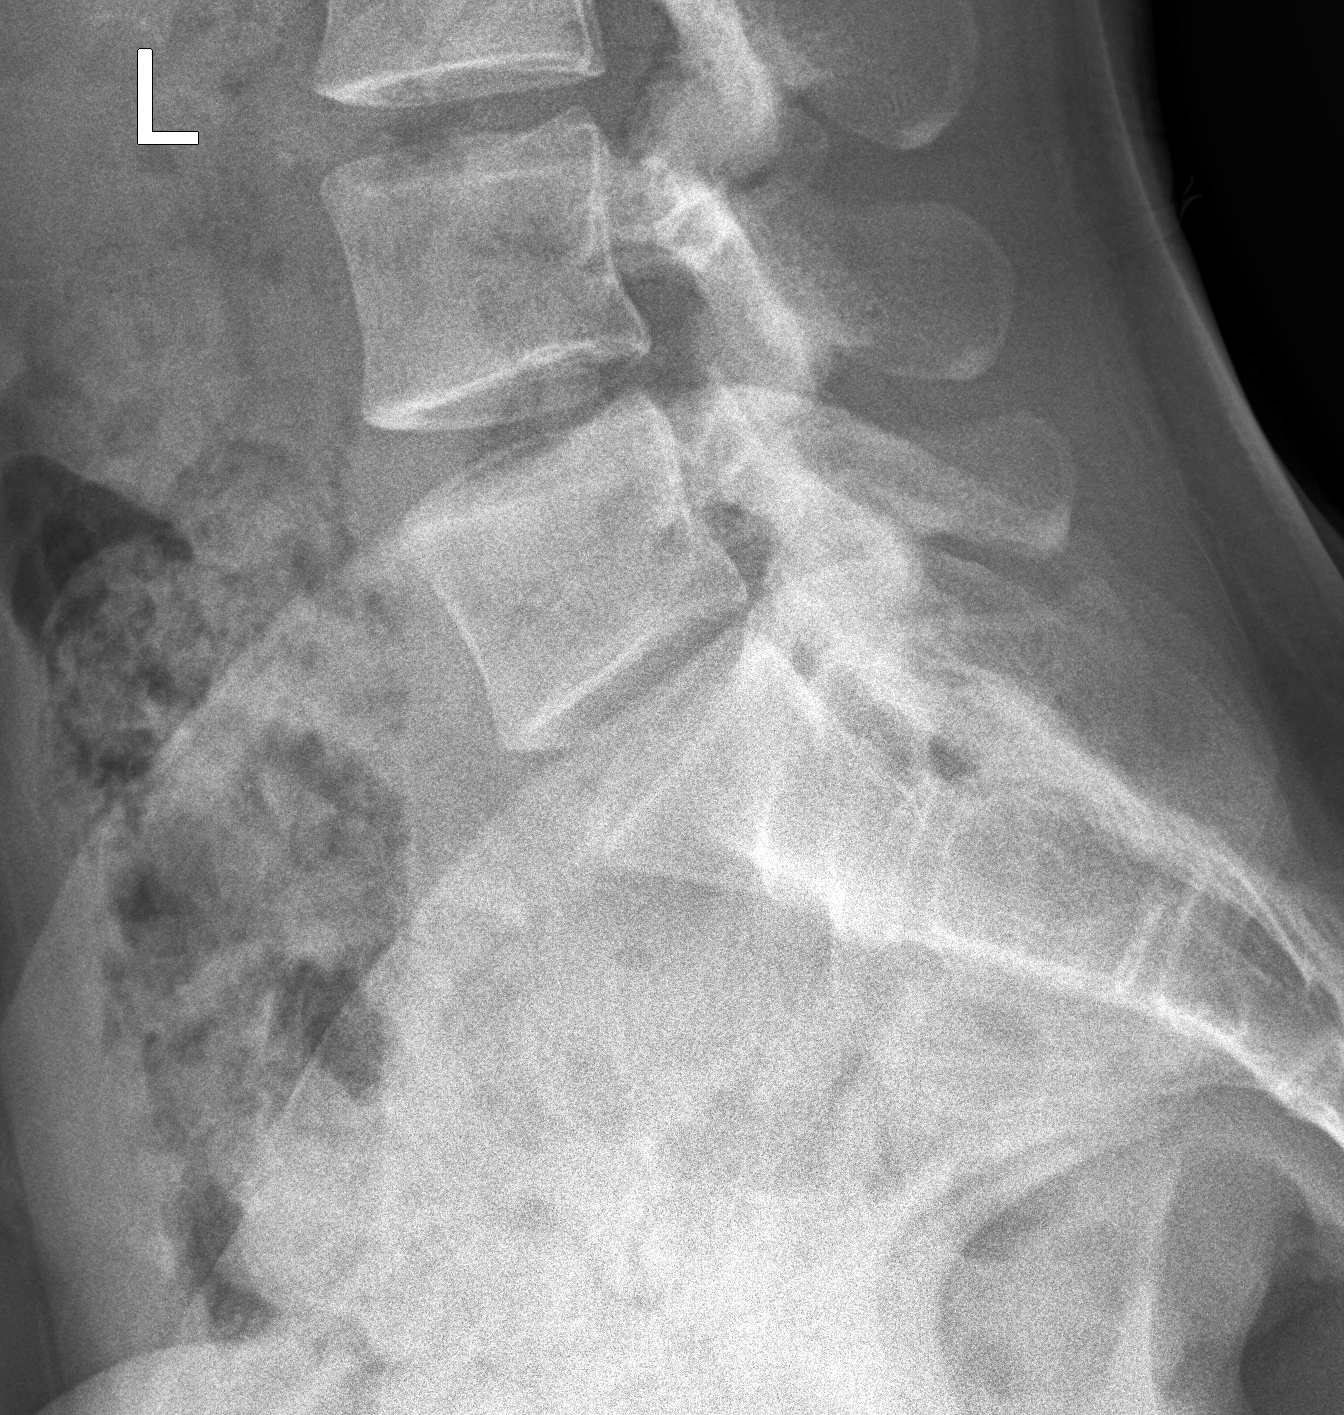

[3 of 3 positions shown; findings below may reference images not displayed]

FINDINGS: Bone mineralization is within normal limits. Normal lumbar
segmentation. Normal vertebral height and alignment. Relatively
preserved disc spaces. Mild lower lumbar facet hypertrophy. Sacral
ala and SI joints appear normal. Visible lower thoracic levels
appear intact.
IMPRESSION: Normal radiographic appearance of the lumbar spine aside from mild
lower lumbar facet degeneration.

## 2017-06-05 NOTE — Progress Notes (Signed)
Subjective:    Patient ID: Debbie Bowen, female    DOB: 14-Dec-1980, 36 y.o.   MRN: [id number]  HPI  Debbie Bowen is a 36 year old female who presents today for complete physical and with several complaints.  1) Back and Hip Pain: Located to the mid lower back that's been present for the past 9 months. Yesterday she thinks she pulled a muscle and her right lower back is hurting. She was at the gym on the monkey bars, she jumped down from the monkey bars and since then has noticed right sided hip pain. She describes her back pain as a dull/achy pain that is intermittently. She typically doesn't take anything for her symptoms. She denies numbness/tingling, weakness, recent trauma. She continues to exercise at the gym, her back pain doesn't interfere with workouts.   Immunizations: -Tetanus: Completed in 2012 or 2014 -Influenza: Declines   Diet: She endorses a healthy diet.  Breakfast: Eggs, fruit Lunch: Chicken, fish Dinner:Chicken, broccoli  Snacks: Nuts, dried fruit  Desserts: 1-2 times weekly Beverages: Water, peach green tea, occasional wine.  Exercise: She works out at [employer] 4-5 times weekly. Eye exam: Completed in 2018 Dental exam: Completes regularly.  Pap: Completed in 2018   Review of Systems  Constitutional: Negative for unexpected weight change.  HENT: Negative for rhinorrhea.   Respiratory: Negative for cough and shortness of breath.   Cardiovascular: Negative for chest pain.  Gastrointestinal: Negative for constipation and diarrhea.  Genitourinary: Negative for difficulty urinating and menstrual problem.  Musculoskeletal: Positive for arthralgias and back pain. Negative for myalgias.  Skin: Negative for rash.  Allergic/Immunologic: Negative for environmental allergies.  Neurological: Negative for dizziness, numbness and headaches.  Psychiatric/Behavioral:       Does experience anxiety, manages well on her own.        Past Medical History:  Diagnosis  Date  . ADD (attention deficit disorder)    No medications  . BV (bacterial vaginosis)    recurrent  . Eczema   . GERD (gastroesophageal reflux disease)   . Insomnia   . Orthodontics    invisalign  . Scoliosis    no issues  . Urticaria   . UTI (lower urinary tract infection)    recurrent  . Wears contact lenses      Social History   Social History  . Marital status: Single    Spouse name: N/A  . Number of children: N/A  . Years of education: 36   Occupational History  . Operations manager     [employer]   Social History Main Topics  . Smoking status: Never Smoker  . Smokeless tobacco: Never Used  . Alcohol use 1.2 oz/week    2 Glasses of wine per week     Comment: occasionally  . Drug use: No  . Sexual activity: Yes    Partners: Male    Birth control/ protection: None   Other Topics Concern  . Not on file   Social History Narrative   Single.    No children.   Self Employed.   Enjoys projects around her house.     Past Surgical History:  Procedure Laterality Date  . ESOPHAGOGASTRODUODENOSCOPY (EGD) WITH PROPOFOL N/A 10/04/2015   Procedure: ESOPHAGOGASTRODUODENOSCOPY (EGD) WITH PROPOFOL;  Surgeon: Lucilla Lame, MD;  Location: [facility];  Service: Endoscopy;  Laterality: N/A;  . NO PAST SURGERIES      Family History  Problem Relation Age of Onset  . Diabetes Mother   .  Breast cancer Mother 57       Breast one side BRCA negative. Triple negative  . Juvenile idiopathic arthritis Father   . Eczema Father   . Diabetes Maternal Aunt   . Diabetes Paternal Aunt   . Breast cancer Paternal Grandmother   . Diabetes Paternal Grandmother   . Asthma Sister   . Asthma Brother   . Diabetes Maternal Grandmother   . Prostate cancer Maternal Grandfather   . Prostate cancer Paternal Grandfather   . Breast cancer Paternal Aunt 86  . Breast cancer Paternal Aunt 55  . Urticaria Neg Hx   . Allergic rhinitis Neg Hx   . Angioedema Neg Hx   .  Immunodeficiency Neg Hx     No Known Allergies  Current Outpatient Prescriptions on File Prior to Visit  Medication Sig Dispense Refill  . Multiple Vitamin (MULTIVITAMIN) tablet Take 1 tablet by mouth daily.    . Probiotic Product (PROBIOTIC DAILY PO) Take by mouth daily.    . Boric Acid CRYS Place 600 mg vaginally at bedtime. Use vaginally every night for two weeks then twice a week (Patient not taking: Reported on 06/05/2017) 500 g 5  . nitrofurantoin, macrocrystal-monohydrate, (MACROBID) 100 MG capsule Take 1 tablet by mouth daily as needed. (Patient not taking: Reported on 06/05/2017) 30 capsule 5   No current facility-administered medications on file prior to visit.     BP 118/78   Pulse 60   Temp 98.1 F (36.7 C) (Oral)   Ht 5' 7"  (1.702 m)   Wt 126 lb 12.8 oz (57.5 kg)   LMP 05/11/2017 (Exact Date)   SpO2 98%   BMI 19.86 kg/m    Objective:   Physical Exam  Constitutional: She is oriented to person, place, and time. She appears well-nourished.  HENT:  Right Ear: Tympanic membrane and ear canal normal.  Left Ear: Tympanic membrane and ear canal normal.  Nose: Nose normal.  Mouth/Throat: Oropharynx is clear and moist.  Eyes: Pupils are equal, round, and reactive to light. Conjunctivae and EOM are normal.  Neck: Neck supple. No thyromegaly present.  Cardiovascular: Normal rate and regular rhythm.   No murmur heard. Pulmonary/Chest: Effort normal and breath sounds normal. She has no rales.  Abdominal: Soft. Bowel sounds are normal. There is no tenderness.  Musculoskeletal: Normal range of motion.       Left shoulder: She exhibits pain. She exhibits normal range of motion and no tenderness.       Right hip: She exhibits normal range of motion, normal strength and no bony tenderness.       Lumbar back: She exhibits pain. She exhibits normal range of motion, no tenderness and no bony tenderness.  Lymphadenopathy:    She has no cervical adenopathy.  Neurological: She is  alert and oriented to person, place, and time. She has normal reflexes. No cranial nerve deficit.  Skin: Skin is warm and dry. No rash noted.  Psychiatric: She has a normal mood and affect.          Assessment & Plan:

## 2017-06-05 NOTE — Patient Instructions (Signed)
Complete lab work and xray prior to leaving today. I will notify you of your results once received.   You will be contacted regarding your referral to physical therapy.  Please let us know if you have not heard back within one week.   Follow up in one year for your annual exam or sooner if needed.  It was a pleasure to see you today!

## 2017-06-05 NOTE — Assessment & Plan Note (Signed)
Tetanus UTD, declines influenza. Pap smear UTD. Commended her on her healthy lifestyle. Exam unremarkable. Labs pending. Follow up in 1 year.

## 2017-06-05 NOTE — Assessment & Plan Note (Signed)
Chronic for nearly one year. Exam today overall stable, no obvious decrease in ROM. Suspect she may be using improper body mechanics while exercising. Check plain films today. Referral placed to physical therapy given duration of symptoms.

## 2017-06-18 ENCOUNTER — Encounter: Payer: Self-pay | Admitting: Primary Care

## 2017-06-22 ENCOUNTER — Ambulatory Visit: Payer: BLUE CROSS/BLUE SHIELD | Admitting: Physical Therapy

## 2017-06-22 ENCOUNTER — Ambulatory Visit: Payer: BLUE CROSS/BLUE SHIELD | Attending: Primary Care

## 2017-06-22 DIAGNOSIS — M546 Pain in thoracic spine: Secondary | ICD-10-CM | POA: Diagnosis present

## 2017-06-22 DIAGNOSIS — M62830 Muscle spasm of back: Secondary | ICD-10-CM | POA: Insufficient documentation

## 2017-06-22 DIAGNOSIS — G8929 Other chronic pain: Secondary | ICD-10-CM

## 2017-06-22 DIAGNOSIS — R293 Abnormal posture: Secondary | ICD-10-CM | POA: Diagnosis present

## 2017-06-22 DIAGNOSIS — M545 Low back pain: Secondary | ICD-10-CM | POA: Insufficient documentation

## 2017-06-22 NOTE — Therapy (Signed)
[place], [place], [address] Phone: [phone]   Fax:  [phone]  Physical Therapy Evaluation  Patient Details  Name: Debbie Bowen MRN: [id number] Date of Birth: February 15, 1981 Referring Provider: Alma Friendly, NP  Encounter Date: 06/22/2017      PT End of Session - 06/22/17 1614    Visit Number 1   Number of Visits 12   Date for PT Re-Evaluation 07/31/17   Authorization Type BCBS   PT Start Time 0300   PT Stop Time 0345   PT Time Calculation (min) 45 min   Activity Tolerance Patient tolerated treatment well;No increased pain   Behavior During Therapy WFL for tasks assessed/performed      Past Medical History:  Diagnosis Date  . ADD (attention deficit disorder)    No medications  . BV (bacterial vaginosis)    recurrent  . Eczema   . GERD (gastroesophageal reflux disease)   . Insomnia   . Orthodontics    invisalign  . Scoliosis    no issues  . Urticaria   . UTI (lower urinary tract infection)    recurrent  . Wears contact lenses     Past Surgical History:  Procedure Laterality Date  . ESOPHAGOGASTRODUODENOSCOPY (EGD) WITH PROPOFOL N/A 10/04/2015   Procedure: ESOPHAGOGASTRODUODENOSCOPY (EGD) WITH PROPOFOL;  Surgeon: Lucilla Lame, MD;  Location: [facility];  Service: Endoscopy;  Laterality: N/A;  . NO PAST SURGERIES      There were no vitals filed for this visit.       Subjective Assessment - 06/22/17 1505    Subjective She reports lower back pain for a year, pulled back 2-3 weeks ago.   Also upper back pain at poserior LT shoulder . No complaints of hip pain today.     Feels pain areas not related and pain does not neccecarily worsen in upper back with lower back   Limitations --  Limits gynm owrkout and carpentry activity.    How long can you sit comfortably? As needed   How long can you stand comfortably? 2 hours   How long can you walk comfortably? none   Diagnostic tests xray: negative   Currently in Pain? No/denies  more tingling in upper back   Pain Location Back   Pain Orientation Right;Left;Lower   Pain Descriptors / Indicators Tingling;Sharp   Pain Type Chronic pain   Pain Onset More than a month ago   Pain Frequency Intermittent   Aggravating Factors  bending, twist   Pain Relieving Factors rest.             [facility] PT Assessment - 06/22/17 0001      Assessment   Medical Diagnosis pain in LT shoulder RT hip and lower back   Referring Provider Alma Friendly, NP     Precautions   Precautions None     Restrictions   Weight Bearing Restrictions No     Balance Screen   Has the patient fallen in the past 6 months Yes   How many times? 1  reaching overhead and dropped item on head and fell   Has the patient had a decrease in activity level because of a fear of falling?  No   Is the patient reluctant to leave their home because of a fear of falling?  No     Prior Function   Level of Independence Independent     Cognition   Overall Cognitive Status Within Functional Limits for tasks  assessed     Observation/Other Assessments   Focus on Therapeutic Outcomes (FOTO)  28% limited     Posture/Postural Control   Posture Comments slump sitting forward head , rounded shoulders  RT scapula more posterior and inf angle higsignificant thoracic kyphosisher,         ROM / Strength   AROM / PROM / Strength AROM;Strength     AROM   Overall AROM Comments LE ROM at hips [facility] bilaterally   AROM Assessment Site Lumbar;Thoracic   Lumbar Flexion 70   Lumbar Extension 30   Lumbar - Right Side Bend 30   Lumbar - Left Side Bend 30   Thoracic Flexion 20   Thoracic Extension 10   Thoracic - Right Side Bend 25   Thoracic - Left Side Bend 20   Thoracic - Right Rotation 45   Thoracic - Left Rotation 30     Strength   Overall Strength Comments Normal LE andUE ,  abdominals  WNL      Flexibility   Soft Tissue Assessment /Muscle  Length yes   Hamstrings RT 60 LT 70     Palpation   Palpation comment ASIS lower on Rt ,, RT PSIS lower   RT leg sligthly longer      Ambulation/Gait   Gait Comments WNL            Objective measurements completed on examination: See above findings.                  PT Education - 06/22/17 1614    Education provided Yes   Education Details POC , findings, HEP , posture   Person(s) Educated Patient   Methods Explanation;Demonstration;Tactile cues;Verbal cues   Comprehension Returned demonstration;Verbalized understanding          PT Short Term Goals - 06/22/17 1627      PT SHORT TERM GOAL #1   Title She will be independent with initial HEP   Time 3   Period Weeks   Status New     PT SHORT TERM GOAL #2   Title 5He will report pain decreased 25% in upper back   Time 3   Period Weeks   Status New     PT SHORT TERM GOAL #3   Title She will demo understanding of good posture   Time 3   Period Weeks   Status New           PT Long Term Goals - 06/22/17 1628      PT LONG TERM GOAL #1   Title She will be independent with all hEP issued   Time 6   Period Weeks   Status New     PT LONG TERM GOAL #2   Title She will report pain decreased 50% or more with carpentry tasks.    Time 6   Period Weeks   Status New     PT LONG TERM GOAL #3   Title She will report pain as intermittant in upper and lower back   Time 6   Period Weeks   Status New     PT LONG TERM GOAL #4   Title She will report working out at gym without pain.   Time 6   Period Weeks   Status New                Plan - 06/22/17 1615    Clinical Impression Statement Ms Owens Shark appears with chronic lower and uper back  pain. She reports injury that has essentially resolved in RT lower backShe feels isintermittant but can go for a long dur ation.   Most sues arre not related Symptoms are promenent finding is significant thoracic kyphosis that is some what fixed creating  abnormal posture.   Clinical Presentation Stable   Clinical Presentation due to: upper and lower back pain chronic in  nature   Clinical Decision Making Low   Rehab Potential Good   Clinical Impairments Affecting Rehab Potential stiffness of thoracic spine   PT Frequency 2x / week   PT Duration 6 weeks   PT Treatment/Interventions Electrical Stimulation;Moist Heat;Passive range of motion;Patient/family education;Manual techniques;Taping;Therapeutic exercise;Dry needling   PT Next Visit Plan REview HEP , band exercis for upper bak strength, core strength, modalities , manual   PT Home Exercise Plan posture/ upper back stretching   Consulted and Agree with Plan of Care Patient      Patient will benefit from skilled therapeutic intervention in order to improve the following deficits and impairments:  Increased fascial restricitons, Decreased range of motion, Pain, Decreased activity tolerance, Increased muscle spasms, Postural dysfunction  Visit Diagnosis: Chronic bilateral low back pain without sciatica - Plan: PT plan of care cert/re-cert  Abnormal posture - Plan: PT plan of care cert/re-cert  Muscle spasm of back - Plan: PT plan of care cert/re-cert  Pain in thoracic spine - Plan: PT plan of care cert/re-cert     Problem List Patient Active Problem List   Diagnosis Date Noted  . Chronic bilateral low back pain without sciatica 06/05/2017  . Dermatitis 05/19/2016  . Perennial allergic rhinitis 05/19/2016  . Allergic conjunctivitis 05/19/2016  . Preventative health care 12/31/2015  . Hyperlipidemia 12/31/2015  . Infertility, female 12/20/2015  . ADHD (attention deficit hyperactivity disorder) 12/06/2015  . Abdominal pain 12/06/2015  . Recurrent UTI 12/06/2015  . Rash and nonspecific skin eruption 12/06/2015    Darrel Hoover  PT 06/22/2017, 4:36 PM  [facility] [address] [place], [place], [address] Phone:  [phone]   Fax:  [phone]  Name: Aaryana Betke MRN: [id number] Date of Birth: 1980-12-24

## 2017-06-22 NOTE — Patient Instructions (Signed)
Issued from cabinet thoracic extension stretches standing and supine. 1-2x/day 1-5 min stretches

## 2017-06-23 ENCOUNTER — Encounter: Payer: Self-pay | Admitting: Primary Care

## 2017-06-23 ENCOUNTER — Ambulatory Visit (INDEPENDENT_AMBULATORY_CARE_PROVIDER_SITE_OTHER): Payer: BLUE CROSS/BLUE SHIELD | Admitting: Primary Care

## 2017-06-23 ENCOUNTER — Encounter: Payer: Self-pay | Admitting: Radiology

## 2017-06-23 VITALS — BP 116/76 | HR 73 | Temp 98.3°F | Ht 67.0 in | Wt 126.0 lb

## 2017-06-23 DIAGNOSIS — F902 Attention-deficit hyperactivity disorder, combined type: Secondary | ICD-10-CM

## 2017-06-23 MED ORDER — AMPHETAMINE-DEXTROAMPHETAMINE 10 MG PO TABS: 10.0000 mg | ORAL_TABLET | Freq: Two times a day (BID) | ORAL | 0 refills | Status: DC

## 2017-06-23 NOTE — Assessment & Plan Note (Addendum)
Will trial Adderall 5-10 mg BID and have her update in 2 weeks via My Chart. She plans on taking this during the work/school week mostly. Contract signed today, will need UDS with next refill.   Recommended she re-connect with therapy.

## 2017-06-23 NOTE — Patient Instructions (Signed)
Start Adderall 10 mg tablets. Take 1/2 to 1 tablet by mouth twice daily.  Follow up with Dr. Lurline Hare at your convenience.   Please update [place] in a few weeks! It was a pleasure to see you today!

## 2017-06-23 NOTE — Progress Notes (Signed)
Subjective:    Patient ID: Debbie Bowen, female    DOB: Feb 09, 1981, 36 y.o.   MRN: [id number]  HPI  Debbie Bowen is a 36 year old female who presents today to discuss ADHD.   She was formally diagnosed with ADHD combined presentation in Summer 2017. She had been treated with Adderall XR 15 mg years ago, always felt "odd" while on the medication but tolerated these symptoms. When re-introduced to Adderall XR 15 mg in Summer of 2017 she felt "odd and out of it".   She was then initiated on Adderall immediate release 15 mg BID for which she took once daily and experienced symptoms of nausea and "feeling weird". She was then initiated Strattera 40 mg which caused decrease in appetite with a weight loss of 5 pounds in two weeks. At that time it was recommended she see therapy for treatment of ADHD. She followed with Dr. Lurline Hare for a few sessions but had to stop seeing him due to her busy schedule.   On 06/18/17 she notified us that she would like to restart Adderall. She has continued to notice difficulty focusing, incompletion of tasks. These symptoms are affecting her work and school life as she's in school taking two classes and is working two jobs.   Review of Systems  Constitutional: Negative for fatigue.  Respiratory: Negative for shortness of breath.   Cardiovascular: Negative for chest pain and palpitations.  Psychiatric/Behavioral: Positive for decreased concentration.       Overall feels anxiety is stable.       Past Medical History:  Diagnosis Date  . ADD (attention deficit disorder)    No medications  . BV (bacterial vaginosis)    recurrent  . Eczema   . GERD (gastroesophageal reflux disease)   . Insomnia   . Orthodontics    invisalign  . Scoliosis    no issues  . Urticaria   . UTI (lower urinary tract infection)    recurrent  . Wears contact lenses      Social History   Social History  . Marital status: Single    Spouse name: N/A  . Number of  children: N/A  . Years of education: 33   Occupational History  . Operations manager     [employer]   Social History Main Topics  . Smoking status: Never Smoker  . Smokeless tobacco: Never Used  . Alcohol use 1.2 oz/week    2 Glasses of wine per week     Comment: occasionally  . Drug use: No  . Sexual activity: Yes    Partners: Male    Birth control/ protection: None   Other Topics Concern  . Not on file   Social History Narrative   Single.    No children.   Self Employed.   Enjoys projects around her house.     Past Surgical History:  Procedure Laterality Date  . ESOPHAGOGASTRODUODENOSCOPY (EGD) WITH PROPOFOL N/A 10/04/2015   Procedure: ESOPHAGOGASTRODUODENOSCOPY (EGD) WITH PROPOFOL;  Surgeon: Lucilla Lame, MD;  Location: [facility];  Service: Endoscopy;  Laterality: N/A;  . NO PAST SURGERIES      Family History  Problem Relation Age of Onset  . Diabetes Mother   . Breast cancer Mother 59       Breast one side BRCA negative. Triple negative  . Juvenile idiopathic arthritis Father   . Eczema Father   . Diabetes Maternal Aunt   . Diabetes Paternal Aunt   . Breast cancer  Paternal Grandmother   . Diabetes Paternal Grandmother   . Asthma Sister   . Asthma Brother   . Diabetes Maternal Grandmother   . Prostate cancer Maternal Grandfather   . Prostate cancer Paternal Grandfather   . Breast cancer Paternal Aunt 38  . Breast cancer Paternal Aunt 42  . Urticaria Neg Hx   . Allergic rhinitis Neg Hx   . Angioedema Neg Hx   . Immunodeficiency Neg Hx     No Known Allergies  Current Outpatient Prescriptions on File Prior to Visit  Medication Sig Dispense Refill  . Boric Acid [place] 600 mg vaginally at bedtime. Use vaginally every night for two weeks then twice a week 500 g 5  . Multiple Vitamin (MULTIVITAMIN) tablet Take 1 tablet by mouth daily.    . Probiotic Product (PROBIOTIC DAILY PO) Take by mouth daily.    . nitrofurantoin,  macrocrystal-monohydrate, (MACROBID) 100 MG capsule Take 1 tablet by mouth daily as needed. (Patient not taking: Reported on 06/23/2017) 30 capsule 5   No current facility-administered medications on file prior to visit.     BP 116/76   Pulse 73   Temp 98.3 F (36.8 C) (Oral)   Ht _0  (1.702 m)   Wt 126 lb (57.2 kg)   LMP 06/06/2017   SpO2 98%   BMI 19.73 kg/m    Objective:   Physical Exam  Constitutional: She appears well-nourished.  Neck: Neck supple.  Cardiovascular: Normal rate and regular rhythm.   Pulmonary/Chest: Effort normal and breath sounds normal.  Skin: Skin is warm and dry.  Psychiatric: She has a normal mood and affect.          Assessment & Plan:

## 2017-06-29 ENCOUNTER — Encounter: Payer: Self-pay | Admitting: Physical Therapy

## 2017-06-29 ENCOUNTER — Ambulatory Visit: Payer: BLUE CROSS/BLUE SHIELD | Admitting: Physical Therapy

## 2017-06-29 DIAGNOSIS — M62830 Muscle spasm of back: Secondary | ICD-10-CM

## 2017-06-29 DIAGNOSIS — R293 Abnormal posture: Secondary | ICD-10-CM

## 2017-06-29 DIAGNOSIS — M545 Low back pain: Secondary | ICD-10-CM | POA: Diagnosis not present

## 2017-06-29 DIAGNOSIS — M546 Pain in thoracic spine: Secondary | ICD-10-CM

## 2017-06-29 DIAGNOSIS — G8929 Other chronic pain: Secondary | ICD-10-CM

## 2017-06-29 NOTE — Therapy (Signed)
[place], [place], [address] Phone: [phone]   Fax:  [phone]  Physical Therapy Treatment  Patient Details  Name: Debbie Bowen MRN: [id number] Date of Birth: [birth year] Referring Provider: Alma Friendly, NP  Encounter Date: 06/29/2017      PT End of Session - 06/29/17 1607    Visit Number 2   Number of Visits 12   Date for PT Re-Evaluation 07/31/17   PT Start Time 7616   PT Stop Time 1640   PT Time Calculation (min) 49 min   Activity Tolerance Patient tolerated treatment well   Behavior During Therapy [facility] for tasks assessed/performed      Past Medical History:  Diagnosis Date  . ADD (attention deficit disorder)    No medications  . BV (bacterial vaginosis)    recurrent  . Eczema   . GERD (gastroesophageal reflux disease)   . Insomnia   . Orthodontics    invisalign  . Scoliosis    no issues  . Urticaria   . UTI (lower urinary tract infection)    recurrent  . Wears contact lenses     Past Surgical History:  Procedure Laterality Date  . ESOPHAGOGASTRODUODENOSCOPY (EGD) WITH PROPOFOL N/A 10/04/2015   Procedure: ESOPHAGOGASTRODUODENOSCOPY (EGD) WITH PROPOFOL;  Surgeon: Lucilla Lame, MD;  Location: [facility];  Service: Endoscopy;  Laterality: N/A;  . NO PAST SURGERIES      There were no vitals filed for this visit.      Subjective Assessment - 06/29/17 1550    Subjective "Seems like i've had more back pain since the last session, i think I could be more aware of it since the last session"    Currently in Pain? Yes   Pain Score 0-No pain   Pain Location Back   Pain Orientation Right;Left   Pain Onset More than a month ago   Pain Frequency Intermittent   Aggravating Factors  bending forward, sitting for long period of time   Pain Relieving Factors N/A                         [facility] Adult PT Treatment/Exercise - 06/29/17 1716      Modalities   Modalities Moist Heat     Moist Heat Therapy   Number Minutes Moist Heat 10 Minutes   Moist Heat Location Lumbar Spine  in prone     Manual Therapy   Manual Therapy Soft tissue mobilization;Myofascial release;Muscle Energy Technique   Soft tissue mobilization IASTM over bil lumbar paraspinals and cross friction technique   Myofascial Release fasical stretching/ rolling over bil lumbar paraspinals,   Muscle Energy Technique Shotgun techniques with resisted abduction progressing to adduction, x 3 with audible cavitation noted  relief of pain and tightness in standing          Trigger Point Dry Needling - 06/29/17 1554    Consent Given? Yes   Education Handout Provided Yes   Muscles Treated Upper Body Longissimus   Longissimus Response Twitch response elicited;Palpable increased muscle length  L4 multifidus bil              PT Education - 06/29/17 1607    Education provided Yes   Education Details muscle anatomy and referral patterns. What TPDN is, benefits/ what to expect and after care  before and during treatment   Person(s) Educated Patient   Methods Explanation;Verbal cues   Comprehension Verbalized understanding;Verbal cues required  PT Short Term Goals - 06/22/17 1627      PT SHORT TERM GOAL #1   Title She will be independent with initial HEP   Time 3   Period Weeks   Status New     PT SHORT TERM GOAL #2   Title 5He will report pain decreased 25% in upper back   Time 3   Period Weeks   Status New     PT SHORT TERM GOAL #3   Title She will demo understanding of good posture   Time 3   Period Weeks   Status New           PT Long Term Goals - 06/22/17 1628      PT LONG TERM GOAL #1   Title She will be independent with all hEP issued   Time 6   Period Weeks   Status New     PT LONG TERM GOAL #2   Title She will report pain decreased 50% or more with carpentry tasks.    Time 6   Period Weeks   Status New     PT  LONG TERM GOAL #3   Title She will report pain as intermittant in upper and lower back   Time 6   Period Weeks   Status New     PT LONG TERM GOAL #4   Title She will report working out at gym without pain.   Time 6   Period Weeks   Status New               Plan - 06/29/17 1719    Clinical Impression Statement pt reports feeling no pain today and consistency with her HEP but it has made her more aware ofher pain. Educated and performed DN over bil lumbar paraspinasl/ multifudi followed with manual techniques. MHP post session for muscle soreness, which she reported decreased pain post session with walking/ standing.    PT Treatment/Interventions Electrical Stimulation;Moist Heat;Passive range of motion;Patient/family education;Manual techniques;Taping;Therapeutic exercise;Dry needling   PT Next Visit Plan assess response to DN, band exercis for upper back strength, core strength, modalities , manual   PT Home Exercise Plan posture/ upper back stretching   Consulted and Agree with Plan of Care Patient      Patient will benefit from skilled therapeutic intervention in order to improve the following deficits and impairments:  Increased fascial restricitons, Decreased range of motion, Pain, Decreased activity tolerance, Increased muscle spasms, Postural dysfunction  Visit Diagnosis: Chronic bilateral low back pain without sciatica  Abnormal posture  Muscle spasm of back  Pain in thoracic spine     Problem List Patient Active Problem List   Diagnosis Date Noted  . Chronic bilateral low back pain without sciatica 06/05/2017  . Dermatitis 05/19/2016  . Perennial allergic rhinitis 05/19/2016  . Allergic conjunctivitis 05/19/2016  . Preventative health care 12/31/2015  . Hyperlipidemia 12/31/2015  . Infertility, female 12/20/2015  . ADHD (attention deficit hyperactivity disorder) 12/06/2015  . Abdominal pain 12/06/2015  . Recurrent UTI 12/06/2015  . Rash and nonspecific  skin eruption 12/06/2015   Starr Lake PT, DPT, LAT, ATC  06/29/17  5:22 PM      [facility] [address] [place], [place], [address] Phone: [phone]   Fax:  [phone]  Name: Debbie Bowen MRN: [id number] Date of Birth: 04-05-1981

## 2017-07-02 ENCOUNTER — Telehealth: Payer: Self-pay

## 2017-07-02 ENCOUNTER — Ambulatory Visit: Payer: BLUE CROSS/BLUE SHIELD | Attending: Primary Care

## 2017-07-02 DIAGNOSIS — R293 Abnormal posture: Secondary | ICD-10-CM | POA: Insufficient documentation

## 2017-07-02 DIAGNOSIS — M545 Low back pain: Secondary | ICD-10-CM | POA: Insufficient documentation

## 2017-07-02 DIAGNOSIS — G8929 Other chronic pain: Secondary | ICD-10-CM | POA: Insufficient documentation

## 2017-07-02 DIAGNOSIS — M62830 Muscle spasm of back: Secondary | ICD-10-CM | POA: Insufficient documentation

## 2017-07-02 DIAGNOSIS — M546 Pain in thoracic spine: Secondary | ICD-10-CM | POA: Insufficient documentation

## 2017-07-02 NOTE — Telephone Encounter (Signed)
Ms Owens Shark was called and message was left with next appointment 07/06/17 at 1:30 with Vania Rea.

## 2017-07-06 ENCOUNTER — Encounter: Payer: Self-pay | Admitting: Physical Therapy

## 2017-07-06 ENCOUNTER — Ambulatory Visit: Payer: BLUE CROSS/BLUE SHIELD | Admitting: Physical Therapy

## 2017-07-06 DIAGNOSIS — M62830 Muscle spasm of back: Secondary | ICD-10-CM

## 2017-07-06 DIAGNOSIS — R293 Abnormal posture: Secondary | ICD-10-CM

## 2017-07-06 DIAGNOSIS — M545 Low back pain: Secondary | ICD-10-CM | POA: Diagnosis not present

## 2017-07-06 DIAGNOSIS — M546 Pain in thoracic spine: Secondary | ICD-10-CM

## 2017-07-06 DIAGNOSIS — G8929 Other chronic pain: Secondary | ICD-10-CM | POA: Diagnosis present

## 2017-07-06 NOTE — Therapy (Signed)
[place], [place], [address] Phone: [phone]   Fax:  [phone]  Physical Therapy Treatment  Patient Details  Name: Debbie Bowen MRN: [id number] Date of Birth: 12/13/1980 Referring Provider: Alma Friendly, NP   Encounter Date: 07/06/2017  PT End of Session - 07/06/17 1421    Visit Number  3    Number of Visits  12    Date for PT Re-Evaluation  07/31/17    PT Start Time  1331    PT Stop Time  1413    PT Time Calculation (min)  42 min       Past Medical History:  Diagnosis Date  . ADD (attention deficit disorder)    No medications  . BV (bacterial vaginosis)    recurrent  . Eczema   . GERD (gastroesophageal reflux disease)   . Insomnia   . Orthodontics    invisalign  . Scoliosis    no issues  . Urticaria   . UTI (lower urinary tract infection)    recurrent  . Wears contact lenses     Past Surgical History:  Procedure Laterality Date  . NO PAST SURGERIES      There were no vitals filed for this visit.  Subjective Assessment - 07/06/17 1337    Subjective  "The DN really helped, it is the best it has felt. I am having some mild dull ache but not bad today"     Currently in Pain?  Yes    Pain Score  2     Pain Location  Back    Pain Orientation  Right;Left    Pain Onset  More than a month ago                      [facility] Adult PT Treatment/Exercise - 07/06/17 1347      Knee/Hip Exercises: Supine   Straight Leg Raises  AROM;Strengthening;Left;2 sets;10 reps with quad set   with quad set   Other Supine Knee/Hip Exercises  dead bug with alternating contralatera UE/LE 2 x 6      Shoulder Exercises: ROM/Strengthening   UBE (Upper Arm Bike)  L1 x 4 min changing direction at 4 min   changing direction at 4 min     Manual Therapy   Soft tissue mobilization  IASTM over L upper trap and supraspinatus     Muscle Energy Technique  Resisted L hip flexion and R  hip extension 5 x 10 sec hold. Self MET pushing down on L flexed hip 5 x 10 sec hold       Trigger Point Dry Needling - 07/06/17 1339    Consent Given?  Yes    Education Handout Provided  Yes    Muscles Treated Upper Body  --    Supraspinatus Response  Twitch response elicited;Palpable increased muscle length    Longissimus Response  -- L only   L only            PT Short Term Goals - 06/22/17 1627      PT SHORT TERM GOAL #1   Title  She will be independent with initial HEP    Time  3    Period  Weeks    Status  New      PT SHORT TERM GOAL #2   Title  5He will report pain decreased 25% in upper back    Time  3    Period  Weeks  Status  New      PT SHORT TERM GOAL #3   Title  She will demo understanding of good posture    Time  3    Period  Weeks    Status  New        PT Long Term Goals - 06/22/17 1628      PT LONG TERM GOAL #1   Title  She will be independent with all hEP issued    Time  6    Period  Weeks    Status  New      PT LONG TERM GOAL #2   Title  She will report pain decreased 50% or more with carpentry tasks.     Time  6    Period  Weeks    Status  New      PT LONG TERM GOAL #3   Title  She will report pain as intermittant in upper and lower back    Time  6    Period  Weeks    Status  New      PT LONG TERM GOAL #4   Title  She will report working out at gym without pain.    Time  6    Period  Weeks    Status  New            Plan - 07/06/17 1422    Clinical Impression Statement  pt reports improvement since the last session with 2/10 pain in the Bowen back. DN was performed on the L supraspinatus, followed with IASTM techniques.  Following posterior innominate rotation correction with stretching and MET for L hip flexors she reported no pain post session.     PT Next Visit Plan  assess response to DN, band exercis for upper back strength, core strength, modalities , manual    PT Home Exercise Plan  posture/ upper back stretching,  SLR, hamstring stretching, self hip flexor MET.        Patient will benefit from skilled therapeutic intervention in order to improve the following deficits and impairments:  Increased fascial restricitons, Decreased range of motion, Pain, Decreased activity tolerance, Increased muscle spasms, Postural dysfunction  Visit Diagnosis: Chronic bilateral Bowen back pain without sciatica  Abnormal posture  Pain in thoracic spine  Muscle spasm of back     Problem List Patient Active Problem List   Diagnosis Date Noted  . Chronic bilateral Bowen back pain without sciatica 06/05/2017  . Dermatitis 05/19/2016  . Perennial allergic rhinitis 05/19/2016  . Allergic conjunctivitis 05/19/2016  . Preventative health care 12/31/2015  . Hyperlipidemia 12/31/2015  . Infertility, female 12/20/2015  . ADHD (attention deficit hyperactivity disorder) 12/06/2015  . Abdominal pain 12/06/2015  . Recurrent UTI 12/06/2015  . Rash and nonspecific skin eruption 12/06/2015   Starr Lake PT, DPT, LAT, ATC  07/06/17  2:26 PM      [facility] [address] [place], [place], [address] Phone: [phone]   Fax:  [phone]  Name: Debbie Bowen MRN: [id number] Date of Birth: 1981-02-25

## 2017-07-09 ENCOUNTER — Ambulatory Visit: Payer: BLUE CROSS/BLUE SHIELD

## 2017-07-10 ENCOUNTER — Telehealth: Payer: Self-pay | Admitting: Primary Care

## 2017-07-10 ENCOUNTER — Ambulatory Visit: Payer: BLUE CROSS/BLUE SHIELD | Admitting: Physician Assistant

## 2017-07-10 ENCOUNTER — Encounter: Payer: Self-pay | Admitting: Primary Care

## 2017-07-10 ENCOUNTER — Encounter: Payer: Self-pay | Admitting: Physician Assistant

## 2017-07-10 VITALS — BP 110/80 | HR 72 | Temp 98.4°F | Ht 67.0 in | Wt 127.4 lb

## 2017-07-10 DIAGNOSIS — R300 Dysuria: Secondary | ICD-10-CM | POA: Diagnosis not present

## 2017-07-10 LAB — POCT URINALYSIS DIPSTICK
BILIRUBIN UA: NEGATIVE
Glucose, UA: NEGATIVE
Ketones, UA: NEGATIVE
NITRITE UA: NEGATIVE
PH UA: 6 (ref 5.0–8.0)
Protein, UA: 15
Spec Grav, UA: 1.025 (ref 1.010–1.025)
Urobilinogen, UA: 0.2 E.U./dL

## 2017-07-10 LAB — POCT URINE PREGNANCY: Preg Test, Ur: NEGATIVE

## 2017-07-10 MED ORDER — CEPHALEXIN 500 MG PO CAPS
500.0000 mg | ORAL_CAPSULE | Freq: Two times a day (BID) | ORAL | 0 refills | Status: DC
Start: 2017-07-10 — End: 2017-07-29

## 2017-07-10 NOTE — Progress Notes (Signed)
Debbie Bowen is a 36 y.o. female here for a new problem.  I acted as a Education administrator for [employer], PA-C Anselmo Pickler, LPN  History of Present Illness:   Chief Complaint  Patient presents with  . Urinary Tract symptoms    Urinary Tract Infection   This is a new problem. Episode onset: started 8 days ago, pt had been on Macrobid x 3 days, symptoms continued so continued for another 4 days.  The problem occurs every urination. The problem has been gradually worsening. The quality of the pain is described as burning and aching. The pain is at a severity of 5/10. The pain is moderate. There has been no fever. She is sexually active. There is no history of pyelonephritis. Associated symptoms include frequency, hematuria and urgency. Pertinent negatives include no chills, discharge, flank pain, nausea, possible pregnancy or vomiting. Associated symptoms comments: Just noticed the blood today.. She has tried antibiotics and increased fluids (Macrobid x 7 days and still having symptoms, also took Azo for one day in the beginning. ) for the symptoms. The treatment provided mild relief. Her past medical history is significant for recurrent UTIs.   She uses prn Macrobid for her UTIs, notes that she has been on this since college. Denies prior hx of kidney infection.  Per her report, she has taken 3 days of 100 mg Macrobid BID, then 2-3 days of 100 mg Macrobid daily, then stopped medication because she doesn't think that it was working.   Past Medical History:  Diagnosis Date  . ADD (attention deficit disorder)    No medications  . BV (bacterial vaginosis)    recurrent  . Eczema   . GERD (gastroesophageal reflux disease)   . Insomnia   . Orthodontics    invisalign  . Scoliosis    no issues  . Urticaria   . UTI (lower urinary tract infection)    recurrent  . Wears contact lenses      Social History   Socioeconomic History  . Marital status: Single    Spouse name: Not on file   . Number of children: Not on file  . Years of education: 28  . Highest education level: Not on file  Social Needs  . Financial resource strain: Not on file  . Food insecurity - worry: Not on file  . Food insecurity - inability: Not on file  . Transportation needs - medical: Not on file  . Transportation needs - non-medical: Not on file  Occupational History  . Occupation: Sales promotion account executive    Comment: [place] [employer]  Tobacco Use  . Smoking status: Never Smoker  . Smokeless tobacco: Never Used  Substance and Sexual Activity  . Alcohol use: Yes    Alcohol/week: 1.2 oz    Types: 2 Glasses of wine per week    Comment: occasionally  . Drug use: No  . Sexual activity: Yes    Partners: Male    Birth control/protection: None  Other Topics Concern  . Not on file  Social History Narrative   Single.    No children.   Self Employed.   Enjoys projects around her house.     Past Surgical History:  Procedure Laterality Date  . NO PAST SURGERIES      Family History  Problem Relation Age of Onset  . Diabetes Mother   . Breast cancer Mother 23       Breast one side BRCA negative. Triple negative  . Juvenile idiopathic arthritis Father   .  Eczema Father   . Diabetes Maternal Aunt   . Diabetes Paternal Aunt   . Breast cancer Paternal Grandmother   . Diabetes Paternal Grandmother   . Asthma Sister   . Asthma Brother   . Diabetes Maternal Grandmother   . Prostate cancer Maternal Grandfather   . Prostate cancer Paternal Grandfather   . Breast cancer Paternal Aunt 1  . Breast cancer Paternal Aunt 82  . Urticaria Neg Hx   . Allergic rhinitis Neg Hx   . Angioedema Neg Hx   . Immunodeficiency Neg Hx     No Known Allergies  Current Medications:   Current Outpatient Medications:  .  amphetamine-dextroamphetamine (ADDERALL) 10 MG tablet, Take 1 tablet (10 mg total) by mouth 2 (two) times daily., Disp: 60 tablet, Rfl: 0 .  Multiple Vitamin (MULTIVITAMIN) tablet, Take 1  tablet by mouth daily., Disp: , Rfl:  .  nitrofurantoin, macrocrystal-monohydrate, (MACROBID) 100 MG capsule, Take 1 tablet by mouth daily as needed., Disp: 30 capsule, Rfl: 5 .  Probiotic Product (PROBIOTIC DAILY PO), Take by mouth daily., Disp: , Rfl:  .  Boric Acid CRYS, Place 600 mg vaginally at bedtime. Use vaginally every night for two weeks then twice a week (Patient not taking: Reported on 07/10/2017), Disp: 500 g, Rfl: 5 .  cephALEXin (KEFLEX) 500 MG capsule, Take 1 capsule (500 mg total) 2 (two) times daily by mouth., Disp: 14 capsule, Rfl: 0   Review of Systems:   Review of Systems  Constitutional: Negative for chills.  Gastrointestinal: Negative for nausea and vomiting.  Genitourinary: Positive for frequency, hematuria and urgency. Negative for flank pain.    Vitals:   Vitals:   07/10/17 1558  BP: 110/80  Pulse: 72  Temp: 98.4 F (36.9 C)  TempSrc: Oral  SpO2: 99%  Weight: 127 lb 6.1 oz (57.8 kg)  Height: _0  (1.702 m)     Body mass index is 19.95 kg/m.  Physical Exam:   Physical Exam  Constitutional: She appears well-developed. She is cooperative.  Non-toxic appearance. She does not have a sickly appearance. She does not appear ill. No distress.  Cardiovascular: Normal rate, regular rhythm, S1 normal, S2 normal, normal heart sounds and normal pulses.  No LE edema  Pulmonary/Chest: Effort normal and breath sounds normal.  Abdominal: Normal appearance and bowel sounds are normal. There is no tenderness. There is no CVA tenderness.  Neurological: She is alert. GCS eye subscore is 4. GCS verbal subscore is 5. GCS motor subscore is 6.  Skin: Skin is warm, dry and intact.  Psychiatric: She has a normal mood and affect. Her speech is normal and behavior is normal.  Nursing note and vitals reviewed.   Results for orders placed or performed in visit on 07/10/17  POCT urinalysis dipstick  Result Value Ref Range   Color, UA Yellow    Clarity, UA Cloudy    Glucose,  UA Negative    Bilirubin, UA Negative    Ketones, UA Negative    Spec Grav, UA 1.025 1.010 - 1.025   Blood, UA 3+    pH, UA 6.0 5.0 - 8.0   Protein, UA 15    Urobilinogen, UA 0.2 0.2 or 1.0 E.U./dL   Nitrite, UA Negative    Leukocytes, UA Large (3+) (A) Negative  POCT urine pregnancy  Result Value Ref Range   Preg Test, Ur Negative Negative    Assessment and Plan:    Jasmon was seen today for urinary tract symptoms.  Diagnoses and all orders for this visit:  Dysuria UA consistent with bacteria. She has failed treatment with Macrobid that she has started on her own. Will switch to Keflex per orders. Culture pending. Urine preg negative. Follow-up if symptoms worsen or persist. No red flags on exam. Recommended that once her symptoms resolve that she visit PCP to discuss possible alternative prophylacitic antibiotic choices as she feels as though the Macrobid is no longer working for her. -     POCT urinalysis dipstick -     POCT urine pregnancy -     Urine Culture -     Cancel: Urine Culture  Other orders -     cephALEXin (KEFLEX) 500 MG capsule; Take 1 capsule (500 mg total) 2 (two) times daily by mouth.   . Reviewed expectations re: course of current medical issues. . Discussed self-management of symptoms. . Outlined signs and symptoms indicating need for more acute intervention. . Patient verbalized understanding and all questions were answered. . See orders for this visit as documented in the electronic medical record. . Patient received an After-Visit Summary.  CMA or LPN served as scribe during this visit. History, Physical, and Plan performed by medical provider. Documentation and orders reviewed and attested to.  Inda Coke, PA-C

## 2017-07-10 NOTE — Telephone Encounter (Signed)
Pt scheduled appt 07/10/17 at 3:45 with Inda Coke PA.

## 2017-07-10 NOTE — Patient Instructions (Signed)
It was great to meet you!  Start the keflex antibiotic, and take for the entire duration. We will contact you with your urine culture results.  Follow-up if symptoms worsen -- especially if you develop lower back, fever, chills, nausea.  Consider scheduling a follow-up appointment with your PCP to discuss change in "as needed" antibiotics.

## 2017-07-10 NOTE — Telephone Encounter (Signed)
Copied from [place] [id number]. Topic: Quick Communication - See Telephone Encounter >> Jul 10, 2017  2:59 PM Bea Graff, NT wrote: CRM for notification. See Telephone encounter for: Patient is calling to get a UA done for her repeat UTIs. She states she has one now that is not getting any better. She would like to be seen this afternoon if at all possible.  07/10/17.

## 2017-07-11 LAB — URINE CULTURE
MICRO NUMBER: 81264960
SPECIMEN QUALITY: ADEQUATE

## 2017-07-13 ENCOUNTER — Ambulatory Visit: Payer: BLUE CROSS/BLUE SHIELD

## 2017-07-13 ENCOUNTER — Telehealth: Payer: Self-pay | Admitting: Primary Care

## 2017-07-13 NOTE — Telephone Encounter (Signed)
Spoken and notified patient of Kate's comments. Patient stated that she was given Keflex since Friday and the symptoms is not better. Patient is asking Anda Kraft to please in [place].  She decline on seeing urology since the last time she went, it did not make a different.

## 2017-07-13 NOTE — Telephone Encounter (Signed)
Please notify patient that after two rounds of antibiotics and a negative urine culture, her symptoms could be from something else including from the vagina/pelvis or interstitial cystitis. She can try AZO or I can send in some pyridium. If no improvement then I recommend she come in for a visit.

## 2017-07-13 NOTE — Telephone Encounter (Signed)
Pt said she gets reoccurring UTIs. Once she gets the lower abd pain she usually takes 2 tablets of an abx she has and it will make the sxs resolve. This time the sxs didn't resolve so she took 3 days worth of abx and her sxs were still there so she saw the PA at another site on Friday. The started her on keflex and she has been taking it since Friday but her sxs have not resolve they have improved a little but not much. Pt said that she still has the dysuria and lower abd pain and pressure. Pt knows her urine cx came back normal so she is not sure what to do next since it seems like the abx isnt working and the cx was negative. Pt did state that for years she has reoccurring UTI that sometime don't grow out any bacteria but she still has the sxs of one.

## 2017-07-13 NOTE — Telephone Encounter (Signed)
Copied from [place] #[id number]. Topic: Quick Communication - See Telephone Encounter >> Jul 13, 2017  8:39 AM Ether Griffins B wrote: CRM for notification. See Telephone encounter for:  Pt would like discuss lab result from last weeks uti seen at another practice for it 07/13/17.

## 2017-07-13 NOTE — Telephone Encounter (Signed)
It looks like she's been getting Macrobid from GYN.  I recommend she continue taking Keflex as prescribed and then update [place] if no resolve once antibiotics are complete. Would she like to see Urology again?

## 2017-07-14 NOTE — Telephone Encounter (Signed)
Noted  

## 2017-07-14 NOTE — Telephone Encounter (Signed)
Spoken and notified patient of Kate's comments. Patient verbalized understanding.  Patient have decided to come in Friday after completing Keflex.

## 2017-07-17 ENCOUNTER — Telehealth: Payer: Self-pay

## 2017-07-17 ENCOUNTER — Ambulatory Visit: Payer: Self-pay | Admitting: Primary Care

## 2017-07-17 NOTE — Telephone Encounter (Signed)
Thank you. No fee.

## 2017-07-17 NOTE — Telephone Encounter (Signed)
Copied from [place] [id number]. Topic: Quick Communication - Appointment Cancellation >> Jul 17, 2017  9:27 AM Aurelio Brash B wrote: Patient called to cancel appointment scheduled for today at 2:45. Patient has  rescheduled their appointment.    Route to department's PEC pool.

## 2017-07-17 NOTE — Telephone Encounter (Signed)
On 07/17/17 at 9:25 pt cancelled f/u appt today at 2:45. Do you want to charge late cancellation fee? Pt rescheduled appt for 07/20/17 at 10:45.

## 2017-07-20 ENCOUNTER — Ambulatory Visit: Payer: BLUE CROSS/BLUE SHIELD | Admitting: Primary Care

## 2017-07-20 ENCOUNTER — Ambulatory Visit: Payer: BLUE CROSS/BLUE SHIELD

## 2017-07-20 DIAGNOSIS — M545 Low back pain: Secondary | ICD-10-CM | POA: Diagnosis not present

## 2017-07-20 DIAGNOSIS — N390 Urinary tract infection, site not specified: Secondary | ICD-10-CM

## 2017-07-20 DIAGNOSIS — R293 Abnormal posture: Secondary | ICD-10-CM

## 2017-07-20 DIAGNOSIS — G8929 Other chronic pain: Secondary | ICD-10-CM | POA: Diagnosis not present

## 2017-07-20 DIAGNOSIS — M546 Pain in thoracic spine: Secondary | ICD-10-CM

## 2017-07-20 DIAGNOSIS — M62830 Muscle spasm of back: Secondary | ICD-10-CM

## 2017-07-20 LAB — POC URINALSYSI DIPSTICK (AUTOMATED)
GLUCOSE UA: NEGATIVE
Ketones, UA: NEGATIVE
Leukocytes, UA: NEGATIVE
NITRITE UA: NEGATIVE
PH UA: 6 (ref 5.0–8.0)
Spec Grav, UA: >=1.03 — AB (ref 1.010–1.025)

## 2017-07-20 NOTE — Therapy (Signed)
[place], [place], [address] Phone: [phone]   Fax:  [phone]  Physical Therapy Treatment  Patient Details  Name: Debbie Bowen MRN: [id number] Date of Birth: Nov 04, 1980 Referring Provider: Alma Friendly, NP   Encounter Date: 07/20/2017  PT End of Session - 07/20/17 1157    Visit Number  4    Number of Visits  12    Date for PT Re-Evaluation  07/31/17    Authorization Type  BCBS    PT Start Time  1149    PT Stop Time  1240    PT Time Calculation (min)  51 min    Activity Tolerance  Patient tolerated treatment well    Behavior During Therapy  [facility] for tasks assessed/performed       Past Medical History:  Diagnosis Date  . ADD (attention deficit disorder)    No medications  . BV (bacterial vaginosis)    recurrent  . Eczema   . GERD (gastroesophageal reflux disease)   . Insomnia   . Orthodontics    invisalign  . Scoliosis    no issues  . Urticaria   . UTI (lower urinary tract infection)    recurrent  . Wears contact lenses     Past Surgical History:  Procedure Laterality Date  . ESOPHAGOGASTRODUODENOSCOPY (EGD) WITH PROPOFOL N/A 10/04/2015   Performed by Lucilla Lame, MD at [facility]  . NO PAST SURGERIES      There were no vitals filed for this visit.  Subjective Assessment - 07/20/17 1154    Subjective  Bladder issue  last 2 weeks.   Reports more pain with pinched nerve now on RT into lower RT leg. she was sitting on a bean bag for 45 min then pain started. Pain more since fridAY night. New meds. DN Lt shoulder  and LT back better post needling         Currently in Pain?  Yes    Pain Score  7     Pain Location  Hip    Pain Orientation  Right    Pain Descriptors / Indicators  Aching    Pain Type  Acute pain    Pain Onset  In the past 7 days    Pain Frequency  Constant    Aggravating Factors   sitting, twisting , lifting RT leg     Pain Relieving Factors   lye flat , standing.  New meds helping          [facility] PT Assessment - 07/20/17 0001      Posture/Postural Control   Posture Comments  slump sitting forward head , rounded shoulders  RT scapula more posterior and inf angle higsignificant thoracic kyphosisher,          AROM   Lumbar Flexion  40    Lumbar Extension  26    Lumbar - Right Side Bend  30    Lumbar - Left Side Bend  30      Flexibility   Hamstrings  sitting and supine equal  20 degrees but RT hip pain with LT leg lift      Palpation   Palpation comment  ASIS lower on Rt ,, RT PSIS lower   RT leg sligthly longer  tender RT glut to firm pressure                   [facility] Adult PT Treatment/Exercise - 07/20/17 0001  Knee/Hip Exercises: Supine   Other Supine Knee/Hip Exercises  isometric adduction and abduction x 10       Moist Heat Therapy   Number Minutes Moist Heat  10 Minutes    Moist Heat Location  Hip R      Manual Therapy   Muscle Energy Technique  Resisted L hip flexion and R hip extension 5 x 10 sec hold. MET pushing down on L flexed hip 5 x 10 sec hold       Trigger Point Dry Needling - 07/20/17 1229    Consent Given?  Yes    Education Handout Provided  Yes    Muscles Treated Lower Body  Piriformis    Gluteus Minimus Response  Twitch response elicited;Palpable increased muscle length glute med on R side    Piriformis Response  Twitch response elicited;Palpable increased muscle length R side only             PT Short Term Goals - 07/20/17 1224      PT SHORT TERM GOAL #1   Title  She will be independent with initial HEP    Status  On-going      PT SHORT TERM GOAL #2   Title  5He will report pain decreased 25% in upper back    Status  Achieved      PT SHORT TERM GOAL #3   Title  She will demo understanding of good posture    Status  Achieved        PT Long Term Goals - 06/22/17 1628      PT LONG TERM GOAL #1   Title  She will be independent with all hEP issued    Time   6    Period  Weeks    Status  New      PT LONG TERM GOAL #2   Title  She will report pain decreased 50% or more with carpentry tasks.     Time  6    Period  Weeks    Status  New      PT LONG TERM GOAL #3   Title  She will report pain as intermittant in upper and lower back    Time  6    Period  Weeks    Status  New      PT LONG TERM GOAL #4   Title  She will report working out at gym without pain.    Time  6    Period  Weeks    Status  New            Plan - 07/20/17 1158    Clinical Impression Statement  After assessment it appears  muscle spasm may be cause of symptoms as SLR in sitting and supine do not elicite radicular symptoms and able to flex hip and spine in supine without probelm. All hip motions free without pain . Some pain with LT SLR on RT hip.  As DN was helpful before I recruited [place] certified PT to apply this technique today  on RT hip glut med  and piriformis    PT Treatment/Interventions  Electrical Stimulation;Moist Heat;Passive range of motion;Patient/family education;Manual techniques;Taping;Therapeutic exercise;Dry needling    PT Next Visit Plan  assess response to DN, band exercis for upper back strength, core strength, modalities , manual, reassess pain     PT Home Exercise Plan  posture/ upper back stretching, SLR, hamstring stretching, self hip flexor MET.     Consulted and Agree  with Plan of Care  Patient       Patient will benefit from skilled therapeutic intervention in order to improve the following deficits and impairments:  Increased fascial restricitons, Decreased range of motion, Pain, Decreased activity tolerance, Increased muscle spasms, Postural dysfunction  Visit Diagnosis: Chronic bilateral low back pain without sciatica  Abnormal posture  Pain in thoracic spine  Muscle spasm of back     Problem List Patient Active Problem List   Diagnosis Date Noted  . Chronic bilateral low back pain without sciatica 06/05/2017  .  Dermatitis 05/19/2016  . Perennial allergic rhinitis 05/19/2016  . Allergic conjunctivitis 05/19/2016  . Preventative health care 12/31/2015  . Hyperlipidemia 12/31/2015  . Infertility, female 12/20/2015  . ADHD (attention deficit hyperactivity disorder) 12/06/2015  . Abdominal pain 12/06/2015  . Recurrent UTI 12/06/2015  . Rash and nonspecific skin eruption 12/06/2015    Darrel Hoover  PT 07/20/2017, 12:33 PM  [facility] [address] [place], [place], [address] Phone: [phone]   Fax:  [phone]  Name: Debbie Bowen MRN: [id number] Date of Birth: December 08, 1980

## 2017-07-20 NOTE — Assessment & Plan Note (Addendum)
Evaluated at Urgent Care this past weekend, diagnosed with pinched nerve. Treated with prednisone course, muscle relaxer, and diclofenac. She's not been taking the diclofenac. She will be seeing PT today. Pain located to right lower back with radiation to right lower extremity.

## 2017-07-20 NOTE — Assessment & Plan Note (Addendum)
Overall improved after taking seven days of Macrobid and seven days of Keflex. Check vaginal specimen with wet prep to ensure no vaginal cause. STD testing completed one month ago, negative. Recommended AZO, push fluids. Repeat UA with trace blood. Culture sent today.

## 2017-07-20 NOTE — Progress Notes (Signed)
Subjective:    Patient ID: Debbie Bowen, female    DOB: 06-Dec-1980, 36 y.o.   MRN: [id number]  HPI  [name] is a 36 year old female with a history of recurrent UTI who presents today with a chief complaint of dysuria.   Long history of recurrent UTI. She has been managed on Macrobid as needed for UTI symptoms, typically will take this with initial symptoms and will experience relief. She was evaluated on 07/10/17 with a chief complaint of dysuria. She had already taken her Macrobid for 7 days and hadn't experienced improvement. UA that day with 3+ leuks, negative nitrates, 3+ blood (not on menstrual period). Culture negative. She was provided with a seven day course of cephalexin.  She called in three days later reporting no improvement in symptoms and requesting a prescription for Cipro. She once followed with Urology years ago and had a negative work up and was told to take the [place]. She's also been taking Nettle Pill OTC with some improvement. She tried AZO without any improvement. She was asked to come in for further evaluation. She completed her cephalexin. Overall she's noticed a slight pressure at the end of urination. She denies vaginal itching, vaginal discharge, hematuria.     Review of Systems  Constitutional: Negative for fever.  Gastrointestinal: Negative for abdominal pain.  Genitourinary: Positive for dysuria. Negative for frequency, menstrual problem and vaginal discharge.       Mild pelvic pressure.  Musculoskeletal: Positive for back pain.       Past Medical History:  Diagnosis Date  . ADD (attention deficit disorder)    No medications  . BV (bacterial vaginosis)    recurrent  . Eczema   . GERD (gastroesophageal reflux disease)   . Insomnia   . Orthodontics    invisalign  . Scoliosis    no issues  . Urticaria   . UTI (lower urinary tract infection)    recurrent  . Wears contact lenses      Social History   Socioeconomic History  .  Marital status: Single    Spouse name: Not on file  . Number of children: Not on file  . Years of education: 36  . Highest education level: Not on file  Social Needs  . Financial resource strain: Not on file  . Food insecurity - worry: Not on file  . Food insecurity - inability: Not on file  . Transportation needs - medical: Not on file  . Transportation needs - non-medical: Not on file  Occupational History  . Occupation: Sales promotion account executive    Comment: [place] [employer]  Tobacco Use  . Smoking status: Never Smoker  . Smokeless tobacco: Never Used  Substance and Sexual Activity  . Alcohol use: Yes    Alcohol/week: 1.2 oz    Types: 2 Glasses of wine per week    Comment: occasionally  . Drug use: No  . Sexual activity: Yes    Partners: Male    Birth control/protection: None  Other Topics Concern  . Not on file  Social History Narrative   Single.    No children.   Self Employed.   Enjoys projects around her house.     Past Surgical History:  Procedure Laterality Date  . ESOPHAGOGASTRODUODENOSCOPY (EGD) WITH PROPOFOL N/A 10/04/2015   Performed by Lucilla Lame, MD at [facility]  . NO PAST SURGERIES      Family History  Problem Relation Age of Onset  . Diabetes Mother   .  Breast cancer Mother 60       Breast one side BRCA negative. Triple negative  . Juvenile idiopathic arthritis Father   . Eczema Father   . Diabetes Maternal Aunt   . Diabetes Paternal Aunt   . Breast cancer Paternal Grandmother   . Diabetes Paternal Grandmother   . Asthma Sister   . Asthma Brother   . Diabetes Maternal Grandmother   . Prostate cancer Maternal Grandfather   . Prostate cancer Paternal Grandfather   . Breast cancer Paternal Aunt 75  . Breast cancer Paternal Aunt 48  . Urticaria Neg Hx   . Allergic rhinitis Neg Hx   . Angioedema Neg Hx   . Immunodeficiency Neg Hx     No Known Allergies  Current Outpatient Medications on File Prior to Visit  Medication Sig Dispense  Refill  . amphetamine-dextroamphetamine (ADDERALL) 10 MG tablet Take 1 tablet (10 mg total) by mouth 2 (two) times daily. 60 tablet 0  . cephALEXin (KEFLEX) 500 MG capsule Take 1 capsule (500 mg total) 2 (two) times daily by mouth. 14 capsule 0  . cyclobenzaprine (FLEXERIL) 10 MG tablet 1 TABLET BY MOUTH THREE TIMES A DAY AS NEEDED AS NEEDED  0  . Multiple Vitamin (MULTIVITAMIN) tablet Take 1 tablet by mouth daily.    . Probiotic Product (PROBIOTIC DAILY PO) Take by mouth daily.    . Boric Acid CRYS Place 600 mg vaginally at bedtime. Use vaginally every night for two weeks then twice a week (Patient not taking: Reported on 07/10/2017) 500 g 5  . predniSONE (DELTASONE) 20 MG tablet TAKE 1 TABLET BY MOUTH EVERY DAY IN THE MORNING  0   No current facility-administered medications on file prior to visit.     BP 118/76   Pulse 72   Temp 98.4 F (36.9 C) (Oral)   Ht 5' 7"  (1.702 m)   Wt 129 lb (58.5 kg)   LMP 07/03/2017   SpO2 99%   BMI 20.20 kg/m    Objective:   Physical Exam  Constitutional: She appears well-nourished.  Neck: Neck supple.  Cardiovascular: Normal rate and regular rhythm.  Pulmonary/Chest: Effort normal and breath sounds normal.  Genitourinary: There is no tenderness or lesion on the right labia. There is no tenderness or lesion on the left labia. Cervix exhibits discharge. Cervix exhibits no motion tenderness.  Genitourinary Comments: Mild whitish vaginal discharge without foul smell.  Skin: Skin is warm and dry.          Assessment & Plan:

## 2017-07-20 NOTE — Patient Instructions (Signed)
We will be in touch with you once we receive your specimen.  Do no take any additional antibiotics.  Consider starting a cranberry tablet once daily.  Ensure you are consuming 64 ounces of water daily.  It was a pleasure to see you today!

## 2017-07-21 LAB — URINE CULTURE
MICRO NUMBER:: 81303085
RESULT: NO GROWTH
SPECIMEN QUALITY: ADEQUATE

## 2017-07-21 LAB — WET PREP BY MOLECULAR PROBE
CANDIDA SPECIES: NOT DETECTED
Gardnerella vaginalis: NOT DETECTED
MICRO NUMBER: 81303086
SPECIMEN QUALITY:: ADEQUATE
TRICHOMONAS VAG: NOT DETECTED

## 2017-07-27 ENCOUNTER — Ambulatory Visit: Payer: BLUE CROSS/BLUE SHIELD

## 2017-07-27 ENCOUNTER — Encounter: Payer: Self-pay | Admitting: Primary Care

## 2017-07-27 DIAGNOSIS — R293 Abnormal posture: Secondary | ICD-10-CM

## 2017-07-27 DIAGNOSIS — M545 Low back pain: Principal | ICD-10-CM

## 2017-07-27 DIAGNOSIS — M62830 Muscle spasm of back: Secondary | ICD-10-CM

## 2017-07-27 DIAGNOSIS — G8929 Other chronic pain: Secondary | ICD-10-CM

## 2017-07-27 DIAGNOSIS — M546 Pain in thoracic spine: Secondary | ICD-10-CM

## 2017-07-27 NOTE — Therapy (Addendum)
[place], [place], [address] Phone: [phone]   Fax:  [phone]  Physical Therapy Treatment/Discharge  Patient Details  Name: Debbie Bowen MRN: [id number] Date of Birth: May 14, 1981 Referring Provider: Alma Friendly, NP   Encounter Date: 07/27/2017  PT End of Session - 07/27/17 1230    Visit Number  5    Number of Visits  12    Date for PT Re-Evaluation  08/21/17    Authorization Type  BCBS    PT Start Time  1230    PT Stop Time  1305    PT Time Calculation (min)  35 min    Activity Tolerance  Patient tolerated treatment well    Behavior During Therapy  [facility] for tasks assessed/performed       Past Medical History:  Diagnosis Date  . ADD (attention deficit disorder)    No medications  . BV (bacterial vaginosis)    recurrent  . Eczema   . GERD (gastroesophageal reflux disease)   . Insomnia   . Orthodontics    invisalign  . Scoliosis    no issues  . Urticaria   . UTI (lower urinary tract infection)    recurrent  . Wears contact lenses     Past Surgical History:  Procedure Laterality Date  . ESOPHAGOGASTRODUODENOSCOPY (EGD) WITH PROPOFOL N/A 10/04/2015   Procedure: ESOPHAGOGASTRODUODENOSCOPY (EGD) WITH PROPOFOL;  Surgeon: Lucilla Lame, MD;  Location: [facility];  Service: Endoscopy;  Laterality: N/A;  . NO PAST SURGERIES      There were no vitals filed for this visit.  Subjective Assessment - 07/27/17 1234    Subjective  A little sore but no pain.    Pain incr but then decreased pain Sat night.  No problem with back Pain lateral hip to thigh.  mild ache RT SI area    Currently in Pain?  No/denies    Pain Orientation  Right SI area    Pain Descriptors / Indicators  Aching    Pain Onset  1 to 4 weeks ago    Pain Frequency  Intermittent    Aggravating Factors   sitting    Pain Relieving Factors  stand lying meds                      [facility] Adult PT  Treatment/Exercise - 07/27/17 0001      Knee/Hip Exercises: Stretches   Passive Hamstring Stretch  Right;1 rep;30 seconds    ITB Stretch  Right;1 rep 45 sec.       Moist Heat Therapy   Number Minutes Moist Heat  8 Minutes    Moist Heat Location  Hip      Manual Therapy   Soft tissue mobilization  RT glut medius       Trigger Point Dry Needling - 07/27/17 1245    Consent Given?  Yes    Education Handout Provided  Yes    Gluteus Minimus Response  Twitch response elicited RT glute med    Piriformis Response  Twitch response elicited           PT Education - 07/27/17 1308    Education provided  Yes    Education Details  discussed her HEP and progression in gym  with incremental advances in resistance /time or reps with eventual return to exercise classes and  higher resistance    Person(s) Educated  Patient    Methods  Explanation  Comprehension  Verbalized understanding       PT Short Term Goals - 07/20/17 1224      PT SHORT TERM GOAL #1   Title  She will be independent with initial HEP    Status  On-going      PT SHORT TERM GOAL #2   Title  5He will report pain decreased 25% in upper back    Status  Achieved      PT SHORT TERM GOAL #3   Title  She will demo understanding of good posture    Status  Achieved        PT Long Term Goals - 07/27/17 1248      PT LONG TERM GOAL #1   Title  She will be independent with all HEP issued    Status  On-going      PT LONG TERM GOAL #2   Title  She will report pain decreased 50% or more with carpentry tasks.     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    Baseline  She has not returned to gyme but at lighter levels.     Status  Partially Met             Plan - 07/27/17 1232    Clinical Impression Statement  Much improved with only intermittant ache lateral hip with some soreness into LT thigh. Only tender areas notred glut med . Pelvis appeared aligned.   Dry needle applied by Carlus Pavlov DPT certotified in DN technique    Clinical Impairments Affecting Rehab Potential  stiffness of thoracic spine    PT Frequency  2x / week    PT Duration  4 weeks    PT Treatment/Interventions  Electrical Stimulation;Moist Heat;Passive range of motion;Patient/family education;Manual techniques;Taping;Therapeutic exercise;Dry needling    PT Next Visit Plan  assess response to DN, band exercis for upper back strength, core strength, modalities , manual, reassess pain, possible discharge next visit  as pt felt she was doing well.      PT Home Exercise Plan  posture/ upper back stretching, SLR, hamstring stretching, self hip flexor MET.     Consulted and Agree with Plan of Care  Patient       Patient will benefit from skilled therapeutic intervention in order to improve the following deficits and impairments:  Increased fascial restricitons, Decreased range of motion, Pain, Decreased activity tolerance, Increased muscle spasms, Postural dysfunction  Visit Diagnosis: Chronic bilateral low back pain without sciatica  Abnormal posture  Pain in thoracic spine  Muscle spasm of back     Problem List Patient Active Problem List   Diagnosis Date Noted  . Chronic bilateral low back pain without sciatica 06/05/2017  . Dermatitis 05/19/2016  . Perennial allergic rhinitis 05/19/2016  . Allergic conjunctivitis 05/19/2016  . Preventative health care 12/31/2015  . Hyperlipidemia 12/31/2015  . Infertility, female 12/20/2015  . ADHD (attention deficit hyperactivity disorder) 12/06/2015  . Abdominal pain 12/06/2015  . Recurrent UTI 12/06/2015  . Rash and nonspecific skin eruption 12/06/2015    Darrel Hoover  PT 07/27/2017, 1:15 PM  [place], [place], [address] Phone: [phone]   Fax:  [phone]  Name: Finnlee Guarnieri MRN: [id number] Date of Birth: 07/08/1981  PHYSICAL THERAPY DISCHARGE SUMMARY  Visits from Start of Care: 5  Current functional level related to goals / functional outcomes: She canceled the appointment after this stating she was feeling better.     Remaining deficits: Unknown  Education / Equipment: HEP Plan: Patient agrees to discharge.  Patient goals were not met. Patient is being discharged due to being pleased with the current functional level.  ?????    Noralee Stain  PT  09/03/17

## 2017-07-29 ENCOUNTER — Ambulatory Visit: Payer: BLUE CROSS/BLUE SHIELD | Admitting: Primary Care

## 2017-07-29 ENCOUNTER — Encounter: Payer: Self-pay | Admitting: Primary Care

## 2017-07-29 ENCOUNTER — Encounter: Payer: Self-pay | Admitting: Physical Therapy

## 2017-07-29 VITALS — BP 118/74 | HR 74 | Temp 98.3°F | Wt 128.0 lb

## 2017-07-29 DIAGNOSIS — N390 Urinary tract infection, site not specified: Secondary | ICD-10-CM

## 2017-07-29 NOTE — Patient Instructions (Signed)
Stop by the front desk and speak with either Rosaria Ferries or Shirlean Mylar regarding your referral to Urology.  It was a pleasure to see you today!

## 2017-07-29 NOTE — Progress Notes (Signed)
Subjective:    Patient ID: Debbie Bowen, female    DOB: 05-28-1981, 36 y.o.   MRN: [id number]  HPI  Debbie Bowen is a 36 year old female who presents today with a chief complaint of pelvic pressure. She also reports intermittent dysuria.   Her symptoms began three days ago. She also experienced pain around her navel which quickly dissipated. She's been taking AZO without any improvement. She denies fevers, chills, nausea, abdominal pain, vaginal discharge, vaginal itching.  She took one Cipro dose that she had from an older prescription with some improvement. She's staying hydrated with water.   She has a long history of recurrent UTI and has been on intermittent Macrobid for prophylaxis for years.   Review of Systems  Constitutional: Negative for fever.  Gastrointestinal: Negative for abdominal pain, constipation, diarrhea and nausea.  Genitourinary: Positive for dysuria and pelvic pain. Negative for flank pain, frequency, hematuria and vaginal discharge.       Past Medical History:  Diagnosis Date  . ADD (attention deficit disorder)    No medications  . BV (bacterial vaginosis)    recurrent  . Eczema   . GERD (gastroesophageal reflux disease)   . Insomnia   . Orthodontics    invisalign  . Scoliosis    no issues  . Urticaria   . UTI (lower urinary tract infection)    recurrent  . Wears contact lenses      Social History   Socioeconomic History  . Marital status: Single    Spouse name: Not on file  . Number of children: Not on file  . Years of education: 43  . Highest education level: Not on file  Social Needs  . Financial resource strain: Not on file  . Food insecurity - worry: Not on file  . Food insecurity - inability: Not on file  . Transportation needs - medical: Not on file  . Transportation needs - non-medical: Not on file  Occupational History  . Occupation: Sales promotion account executive    Comment: [employer]  Tobacco Use  . Smoking status:  Never Smoker  . Smokeless tobacco: Never Used  Substance and Sexual Activity  . Alcohol use: Yes    Alcohol/week: 1.2 oz    Types: 2 Glasses of wine per week    Comment: occasionally  . Drug use: No  . Sexual activity: Yes    Partners: Male    Birth control/protection: None  Other Topics Concern  . Not on file  Social History Narrative   Single.    No children.   Self Employed.   Enjoys projects around her house.     Past Surgical History:  Procedure Laterality Date  . ESOPHAGOGASTRODUODENOSCOPY (EGD) WITH PROPOFOL N/A 10/04/2015   Procedure: ESOPHAGOGASTRODUODENOSCOPY (EGD) WITH PROPOFOL;  Surgeon: Lucilla Lame, MD;  Location: [facility];  Service: Endoscopy;  Laterality: N/A;  . NO PAST SURGERIES      Family History  Problem Relation Age of Onset  . Diabetes Mother   . Breast cancer Mother 59       Breast one side BRCA negative. Triple negative  . Juvenile idiopathic arthritis Father   . Eczema Father   . Diabetes Maternal Aunt   . Diabetes Paternal Aunt   . Breast cancer Paternal Grandmother   . Diabetes Paternal Grandmother   . Asthma Sister   . Asthma Brother   . Diabetes Maternal Grandmother   . Prostate cancer Maternal Grandfather   . Prostate cancer Paternal  Grandfather   . Breast cancer Paternal Aunt 2  . Breast cancer Paternal Aunt 71  . Urticaria Neg Hx   . Allergic rhinitis Neg Hx   . Angioedema Neg Hx   . Immunodeficiency Neg Hx     No Known Allergies  Current Outpatient Medications on File Prior to Visit  Medication Sig Dispense Refill  . amphetamine-dextroamphetamine (ADDERALL) 10 MG tablet Take 1 tablet (10 mg total) by mouth 2 (two) times daily. 60 tablet 0  . cyclobenzaprine (FLEXERIL) 10 MG tablet 1 TABLET BY MOUTH THREE TIMES A DAY AS NEEDED AS NEEDED  0  . Multiple Vitamin (MULTIVITAMIN) tablet Take 1 tablet by mouth daily.    . predniSONE (DELTASONE) 20 MG tablet TAKE 1 TABLET BY MOUTH EVERY DAY IN THE MORNING  0  . Probiotic  Product (PROBIOTIC DAILY PO) Take by mouth daily.    . Boric Acid CRYS Place 600 mg vaginally at bedtime. Use vaginally every night for two weeks then twice a week (Patient not taking: Reported on 07/29/2017) 500 g 5   No current facility-administered medications on file prior to visit.     BP 118/74   Pulse 74   Temp 98.3 F (36.8 C) (Oral)   Wt 128 lb (58.1 kg)   LMP 07/03/2017   BMI 20.05 kg/m    Objective:   Physical Exam  Constitutional: She appears well-nourished.  Neck: Neck supple.  Cardiovascular: Normal rate and regular rhythm.  Pulmonary/Chest: Effort normal and breath sounds normal.  Abdominal: Soft. Bowel sounds are normal. There is no tenderness. There is no CVA tenderness.  Skin: Skin is warm and dry.          Assessment & Plan:

## 2017-07-29 NOTE — Assessment & Plan Note (Signed)
Return of symptoms three days ago. Does not seem to be GI or GYN related.  Will refer to Urology for further evaluation. Declines UA with culture today.

## 2017-09-16 ENCOUNTER — Other Ambulatory Visit (INDEPENDENT_AMBULATORY_CARE_PROVIDER_SITE_OTHER): Payer: BLUE CROSS/BLUE SHIELD

## 2017-09-16 VITALS — BP 147/90 | HR 72

## 2017-09-16 DIAGNOSIS — N898 Other specified noninflammatory disorders of vagina: Secondary | ICD-10-CM

## 2017-09-16 DIAGNOSIS — Z113 Encounter for screening for infections with a predominantly sexual mode of transmission: Secondary | ICD-10-CM | POA: Diagnosis not present

## 2017-09-16 MED ORDER — FLUCONAZOLE 150 MG PO TABS: 150.0000 mg | ORAL_TABLET | Freq: Once | ORAL | 0 refills | Status: AC

## 2017-09-16 NOTE — Progress Notes (Unsigned)
Patient presented to the office today for a self swab. Patient reports being on an antibiotic recently and now she is having some vaginal discharge and would like to have a self swab for discharge.

## 2017-09-17 LAB — CERVICOVAGINAL ANCILLARY ONLY
Bacterial vaginitis: NEGATIVE
Candida vaginitis: NEGATIVE
Trichomonas: NEGATIVE

## 2017-10-13 ENCOUNTER — Encounter: Payer: Self-pay | Admitting: Family Medicine

## 2017-10-13 ENCOUNTER — Ambulatory Visit: Payer: BLUE CROSS/BLUE SHIELD | Admitting: Family Medicine

## 2017-10-13 VITALS — BP 135/83 | HR 72 | Wt 132.5 lb

## 2017-10-13 DIAGNOSIS — Z113 Encounter for screening for infections with a predominantly sexual mode of transmission: Secondary | ICD-10-CM

## 2017-10-13 DIAGNOSIS — N898 Other specified noninflammatory disorders of vagina: Secondary | ICD-10-CM | POA: Diagnosis not present

## 2017-10-13 MED ORDER — METRONIDAZOLE 500 MG PO TABS: 500.0000 mg | ORAL_TABLET | Freq: Two times a day (BID) | ORAL | 0 refills | Status: DC

## 2017-10-13 NOTE — Patient Instructions (Signed)

## 2017-10-13 NOTE — Progress Notes (Signed)
   Subjective:    Patient ID: Debbie Bowen is a 37 y.o. female presenting with No chief complaint on file.  on 10/13/2017  HPI: Itchy and discharge noted. Has h/o recurrent BV. Has tired boric acid in the past, but only took it once weekly and found it was not helpful. Having odor and discharge. Is not currently contracepting. She is ok if gets pregnant. Has not pursued fertility testing. She does take baths 3-4x weekly.  Review of Systems  Constitutional: Negative for chills and fever.  Respiratory: Negative for shortness of breath.   Cardiovascular: Negative for chest pain.  Gastrointestinal: Negative for abdominal pain, nausea and vomiting.  Genitourinary: Negative for dysuria.  Skin: Negative for rash.      Objective:    BP 135/83   Pulse 72   Wt 132 lb 8 oz (60.1 kg)   LMP 09/22/2017   BMI 20.75 kg/m  Physical Exam  Constitutional: She is oriented to person, place, and time. She appears well-developed and well-nourished. No distress.  HENT:  Head: Normocephalic and atraumatic.  Eyes: No scleral icterus.  Neck: Neck supple.  Cardiovascular: Normal rate.  Pulmonary/Chest: Effort normal.  Abdominal: Soft.  Neurological: She is alert and oriented to person, place, and time.  Skin: Skin is warm and dry.  Psychiatric: She has a normal mood and affect.        Assessment & Plan:  Vaginal irritation - Likely recurrent BV--no baths--treat with Flagyl. Trial of boric acid if stopping baths does not help. - Plan: Cervicovaginal ancillary only, metroNIDAZOLE (FLAGYL) 500 MG tablet   Total face-to-face time with patient: 10 minutes. Over 50% of encounter was spent on counseling and coordination of care. Return if symptoms worsen or fail to improve.  Donnamae Jude 10/13/2017 2:59 PM

## 2017-10-14 LAB — CERVICOVAGINAL ANCILLARY ONLY
Bacterial vaginitis: NEGATIVE
CANDIDA VAGINITIS: NEGATIVE
Chlamydia: NEGATIVE
Neisseria Gonorrhea: NEGATIVE
TRICH (WINDOWPATH): NEGATIVE

## 2017-11-03 ENCOUNTER — Ambulatory Visit: Payer: Self-pay

## 2017-11-03 NOTE — Telephone Encounter (Signed)
Patient called in with c/o "chest pain." She says "it hurts in the center of my chest, that's the only place. It started this morning and is mild. When I take a deep breath, it's more noticeable, but does not intensify the pain." I asked about chest injury, she said "no injury to my chest." I asked is she coughing, she says "no, not really." She denies nausea, vomiting, dizziness, difficulty breathing, fever. According to protocol, see PCP within 3 days, appointment made for tomorrow by agent prior to NT, care advice given, patient verbalized understanding.   Reason for Disposition . [1] Chest pain(s) lasting a few seconds AND [2] persists > 3 days  Answer Assessment - Initial Assessment Questions 1. LOCATION: "Where does it hurt?"       Center of chest 2. RADIATION: "Does the pain go anywhere else?" (e.g., into neck, jaw, arms, back)     No 3. ONSET: "When did the chest pain begin?" (Minutes, hours or days)      This morning 4. PATTERN "Does the pain come and go, or has it been constant since it started?"  "Does it get worse with exertion?"      Constant 5. DURATION: "How long does it last" (e.g., seconds, minutes, hours)     N/A 6. SEVERITY: "How bad is the pain?"  (e.g., Scale 1-10; mild, moderate, or severe)    - MILD (1-3): doesn't interfere with normal activities     - MODERATE (4-7): interferes with normal activities or awakens from sleep    - SEVERE (8-10): excruciating pain, unable to do any normal activities       Mild at rest and moving around 7. CARDIAC RISK FACTORS: "Do you have any history of heart problems or risk factors for heart disease?" (e.g., prior heart attack, angina; high blood pressure, diabetes, being overweight, high cholesterol, smoking, or strong family history of heart disease)     No 8. PULMONARY RISK FACTORS: "Do you have any history of lung disease?"  (e.g., blood clots in lung, asthma, emphysema, birth control pills)     No 9. CAUSE: "What do you think is  causing the chest pain?"     I don't know 10. OTHER SYMPTOMS: "Do you have any other symptoms?" (e.g., dizziness, nausea, vomiting, sweating, fever, difficulty breathing, cough)       No 11. PREGNANCY: "Is there any chance you are pregnant?" "When was your last menstrual period?"       No-LMP 1 week ago  Protocols used: CHEST PAIN-A-AH

## 2017-11-04 ENCOUNTER — Ambulatory Visit: Payer: BLUE CROSS/BLUE SHIELD | Admitting: Primary Care

## 2017-11-04 ENCOUNTER — Encounter: Payer: Self-pay | Admitting: Primary Care

## 2017-11-04 VITALS — BP 110/70 | HR 75 | Temp 98.4°F | Wt 132.8 lb

## 2017-11-04 DIAGNOSIS — R079 Chest pain, unspecified: Secondary | ICD-10-CM

## 2017-11-04 NOTE — Patient Instructions (Signed)
The picture of your heart looks good.  Your symptoms could have been secondary to heartburn.  Please notify me if your symptoms return as discussed.   It was a pleasure to see you today!

## 2017-11-04 NOTE — Progress Notes (Signed)
Subjective:    Patient ID: Debbie Bowen, female    DOB: 10-29-1980, 37 y.o.   MRN: [id number]  HPI  Debbie Bowen is a 37 year old female who presents today with a chief complaint of chest pain. Her pain was located to the substernal chest which began yesterday and lasted for most of the day. She describes her pain as a "dull ache" with one sharp pain to her left upper abdomen. When she took deep breaths her pain gradually moved downward to the epigastric region, and also slightly to the left abdomen. She did not experience pain with deep inspiration.  She denies belching, nausea, diarrhea, abdominal pain., shortness of breath, cough, fevers, palpitations, changes in diet, long travel or periods of inactivity. She is a non smoker and is not on OCP's. She has noticed some postnasal drip with mucous to the back of her throat. She didn't take anything OTC for her symptoms. She denies pain today. She has been lifting heavy objects yesterday as she's remodeling a house.   Review of Systems  Constitutional: Negative for fever.  Respiratory: Negative for cough and shortness of breath.   Cardiovascular:       Substernal chest pain yesterday  Gastrointestinal: Negative for abdominal pain, diarrhea and nausea.  Musculoskeletal: Negative for myalgias.       Past Medical History:  Diagnosis Date  . ADD (attention deficit disorder)    No medications  . BV (bacterial vaginosis)    recurrent  . Eczema   . GERD (gastroesophageal reflux disease)   . Insomnia   . Orthodontics    invisalign  . Scoliosis    no issues  . Urticaria   . UTI (lower urinary tract infection)    recurrent  . Wears contact lenses      Social History   Socioeconomic History  . Marital status: Single    Spouse name: Not on file  . Number of children: Not on file  . Years of education: 26  . Highest education level: Not on file  Social Needs  . Financial resource strain: Not on file  . Food insecurity -  worry: Not on file  . Food insecurity - inability: Not on file  . Transportation needs - medical: Not on file  . Transportation needs - non-medical: Not on file  Occupational History  . Occupation: Sales promotion account executive    Comment: [employer]  Tobacco Use  . Smoking status: Never Smoker  . Smokeless tobacco: Never Used  Substance and Sexual Activity  . Alcohol use: Yes    Alcohol/week: 1.2 oz    Types: 2 Glasses of wine per week    Comment: occasionally  . Drug use: No  . Sexual activity: Yes    Partners: Male    Birth control/protection: None  Other Topics Concern  . Not on file  Social History Narrative   Single.    No children.   Self Employed.   Enjoys projects around her house.     Past Surgical History:  Procedure Laterality Date  . ESOPHAGOGASTRODUODENOSCOPY (EGD) WITH PROPOFOL N/A 10/04/2015   Procedure: ESOPHAGOGASTRODUODENOSCOPY (EGD) WITH PROPOFOL;  Surgeon: Lucilla Lame, MD;  Location: [facility];  Service: Endoscopy;  Laterality: N/A;  . NO PAST SURGERIES      Family History  Problem Relation Age of Onset  . Diabetes Mother   . Breast cancer Mother 101       Breast one side BRCA negative. Triple negative  . Juvenile  idiopathic arthritis Father   . Eczema Father   . Diabetes Maternal Aunt   . Diabetes Paternal Aunt   . Breast cancer Paternal Grandmother   . Diabetes Paternal Grandmother   . Asthma Sister   . Asthma Brother   . Diabetes Maternal Grandmother   . Prostate cancer Maternal Grandfather   . Prostate cancer Paternal Grandfather   . Breast cancer Paternal Aunt 21  . Breast cancer Paternal Aunt 15  . Urticaria Neg Hx   . Allergic rhinitis Neg Hx   . Angioedema Neg Hx   . Immunodeficiency Neg Hx     No Known Allergies  Current Outpatient Medications on File Prior to Visit  Medication Sig Dispense Refill  . cyclobenzaprine (FLEXERIL) 10 MG tablet 1 TABLET BY MOUTH THREE TIMES A DAY AS NEEDED AS NEEDED  0  . metroNIDAZOLE  (FLAGYL) 500 MG tablet Take 1 tablet (500 mg total) by mouth 2 (two) times daily. 14 tablet 0  . Multiple Vitamin (MULTIVITAMIN) tablet Take 1 tablet by mouth daily.    . predniSONE (DELTASONE) 20 MG tablet TAKE 1 TABLET BY MOUTH EVERY DAY IN THE MORNING  0  . Probiotic Product (PROBIOTIC DAILY PO) Take by mouth daily.    Marland Kitchen amphetamine-dextroamphetamine (ADDERALL) 10 MG tablet Take 1 tablet (10 mg total) by mouth 2 (two) times daily. (Patient not taking: Reported on 10/13/2017) 60 tablet 0  . Boric Acid CRYS Place 600 mg vaginally at bedtime. Use vaginally every night for two weeks then twice a week (Patient not taking: Reported on 10/13/2017) 500 g 5   No current facility-administered medications on file prior to visit.     BP 110/70   Pulse 75   Temp 98.4 F (36.9 C) (Oral)   Wt 132 lb 12 oz (60.2 kg)   LMP 10/25/2017   SpO2 99%   BMI 20.79 kg/m    Objective:   Physical Exam  Constitutional: She appears well-nourished.  Neck: Neck supple.  Cardiovascular: Normal rate, regular rhythm and normal heart sounds.  Pulmonary/Chest: Effort normal and breath sounds normal.  Abdominal: Soft. Bowel sounds are normal. There is no tenderness.  Musculoskeletal:  Chest wall non tender upon palpation   Skin: Skin is warm and dry.  Psychiatric: She has a normal mood and affect.          Assessment & Plan:  Chest Pain:  Occurred to substernal region yesterday, lasted most of the day. No pain today.  Suspect MSK vs GERD rather than cardiac. No signs of PE, ACS, URI. ECG: Sinus bradycardia with rate of 59. No t-wave inversion, ST elevation/depression, PAC/PVC. No old ECG on file. Discussed options for further work up, she has a low threshold for PE given lack of any contributing factors. Given that her pain has resolved and ECG unremarkable, will have her notify us if symptoms return. If symptoms return then recommend further work up. She agrees with plan.  Pleas Koch, NP

## 2017-12-23 ENCOUNTER — Encounter: Payer: Self-pay | Admitting: Student

## 2017-12-23 ENCOUNTER — Ambulatory Visit: Payer: BLUE CROSS/BLUE SHIELD | Admitting: Student

## 2017-12-23 ENCOUNTER — Other Ambulatory Visit: Payer: Self-pay | Admitting: Student

## 2017-12-23 VITALS — BP 119/80 | HR 66 | Wt 131.2 lb

## 2017-12-23 DIAGNOSIS — Z113 Encounter for screening for infections with a predominantly sexual mode of transmission: Secondary | ICD-10-CM

## 2017-12-23 DIAGNOSIS — N760 Acute vaginitis: Secondary | ICD-10-CM

## 2017-12-23 DIAGNOSIS — N898 Other specified noninflammatory disorders of vagina: Secondary | ICD-10-CM | POA: Diagnosis not present

## 2017-12-23 MED ORDER — BORIC ACID CRYS: 600.0000 mg | CRYSTALS | Freq: Every day | 5 refills | Status: DC

## 2017-12-23 MED ORDER — FLUCONAZOLE 150 MG PO TABS: 150.0000 mg | ORAL_TABLET | Freq: Every day | ORAL | 2 refills | Status: DC

## 2017-12-23 NOTE — Progress Notes (Signed)
LAB

## 2017-12-23 NOTE — Patient Instructions (Signed)
Probiotics What are probiotics? Probiotics are the good bacteria and yeasts that live in your body and keep you and your digestive system healthy. Probiotics also help your body's defense (immune) system and protect your body against bad bacterial growth. Certain foods contain probiotics, such as yogurt. Probiotics can also be purchased as a supplement. As with any supplement or drug, it is important to discuss its use with your health care provider. What affects the balance of bacteria in my body? The balance of bacteria in your body can be affected by:  Antibiotic medicines. Antibiotics are sometimes necessary to treat infection. Unfortunately, they may kill good or friendly bacteria in your body as well as the bad bacteria. This may lead to stomach problems like diarrhea, gas, and cramping.  Disease. Some conditions are the result of an overgrowth of bad bacteria, yeasts, parasites, or fungi. These conditions include: ? Infectious diarrhea. ? Stomach and respiratory infections. ? Skin infections. ? Irritable bowel syndrome (IBS). ? Inflammatory bowel diseases. ? Ulcer due to Helicobacter pylori (H. pylori) infection. ? Tooth decay and periodontal disease. ? Vaginal infections.  Stress and poor diet may also lower the good bacteria in your body. What type of probiotic is right for me? Probiotics are available over the counter at your local pharmacy, health food, or grocery store. They come in many different forms, combinations of strains, and dosing strengths. Some may need to be refrigerated. Always read the label for storage and usage instructions. Specific strains have been shown to be more effective for certain conditions. Ask your health care provider what option is best for you. Why would I need probiotics? There are many reasons your health care provider might recommend a probiotic supplement, including:  Diarrhea.  Constipation.  IBS.  Respiratory infections.  Yeast  infections.  Acne, eczema, and other skin conditions.  Frequent urinary tract infections (UTIs).  Are there side effects of probiotics? Some people experience mild side effects when taking probiotics. Side effects are usually temporary and may include:  Gas.  Bloating.  Cramping.  Rarely, serious side effects, such as infection or immune system changes, may occur. What else do I need to know about probiotics?  There are many different strains of probiotics. Certain strains may be more effective depending on your condition. Probiotics are available in varying doses. Ask your health care provider which probiotic you should use and how often.  If you are taking probiotics along with antibiotics, it is generally recommended to wait at least 2 hours between taking the antibiotic and taking the probiotic. For more information: National Center for Complementary and Alternative Medicine http://nccam.nih.gov/ This information is not intended to replace advice given to you by your health care provider. Make sure you discuss any questions you have with your health care provider. Document Released: 03/15/2014 Document Revised: 07/15/2016 Document Reviewed: 11/15/2013 Elsevier Interactive Patient Education  2017 Elsevier Inc.  

## 2017-12-23 NOTE — Progress Notes (Signed)
Subjective:     Patient ID: Debbie Bowen, female   DOB: Nov 23, 1980, 37 y.o.   MRN: [id number]  HPI Patient Debbie Bowen is a 37 y.o. G1P0010 Here with complaints of increased heavy discharge. No odor, no clumps, no itching. She says her yeast never itches. She has been seen for multiple visits for heavy discharge; her last two swabs were negative for all vaginal infections.   Review of Systems  Constitutional: Negative.   HENT: Negative.   Respiratory: Negative.   Cardiovascular: Negative.   Gastrointestinal: Negative.   Genitourinary: Positive for vaginal discharge.  Neurological: Negative.   Psychiatric/Behavioral: Negative.        Objective:   Physical Exam  Constitutional: She is oriented to person, place, and time. She appears well-developed.  HENT:  Head: Normocephalic.  Neck: Normal range of motion.  Abdominal: Soft.  Genitourinary: Vagina normal.  Genitourinary Comments: Normal external female genitalia, vaginal walls are pink without erythema; copious white discharge in the vagina. No clumps. No odor. No frothing.   Musculoskeletal: Normal range of motion.  Neurological: She is alert and oriented to person, place, and time.  Skin: Skin is warm and dry.       Assessment:      1. Vaginal discharge        Plan:     1. Will send wet prep and test for BV, yeast, trich, GC CT.  2. Reassured patient of normalcy of vaginal discharge, advised patient to track her vaginal discharge and see if she can determine a pattern in the heaviness of her discharge around ovulation, post-menses, etc.  3. Patient requests to have Boric Acid and Diflucan to have on hand (she has flagyl at home).  4. Advised patient to come back and self swab if she thinks she has BV or yeast in the future. Instructed her not to take medications for BV or yeast until she has been diagnosed properly as doing so may cause further imbalance of flora.

## 2017-12-24 LAB — CERVICOVAGINAL ANCILLARY ONLY
BACTERIAL VAGINITIS: NEGATIVE
CANDIDA VAGINITIS: NEGATIVE
Chlamydia: NEGATIVE
Neisseria Gonorrhea: NEGATIVE
Trichomonas: NEGATIVE

## 2018-01-18 ENCOUNTER — Encounter: Payer: Self-pay | Admitting: Obstetrics and Gynecology

## 2018-01-18 ENCOUNTER — Ambulatory Visit: Payer: BLUE CROSS/BLUE SHIELD | Admitting: Obstetrics and Gynecology

## 2018-01-18 VITALS — BP 116/80 | HR 72 | Wt 129.6 lb

## 2018-01-18 DIAGNOSIS — N946 Dysmenorrhea, unspecified: Secondary | ICD-10-CM | POA: Diagnosis not present

## 2018-01-18 NOTE — Progress Notes (Signed)
Obstetrics and Gynecology Established Patient Evaluation  Appointment Date: 01/18/2018  OBGYN Clinic: [facility]  Primary Care Provider: Pleas Bowen  Referring Provider: self  Chief Complaint:  Chief Complaint  Patient presents with  . Pelvic Pain    History of Present Illness: Debbie Bowen is a 37 y.o. African-American G1P0010 (Patient's last menstrual period was 01/04/2018.), seen for the above chief complaint. Her past medical history is significant for nothing.  Patient states that starting early 2018 her periods became longer and more painful. Prior to this, it was 3-4d and not heavy or really crampy. Since early 2018, her periods are now 7 days and painful, but not heavy. She's done OTC NSAIDs and they don't really help. She's also had some occasional llq discomfort and nausea during the month.   No breast s/s, vomiting, abdominal pain, dysuria, vaginal itching, dyspareunia, diarrhea, constipation,   Review of Systems:  as noted in the History of Present Illness.  Past Medical History:  Past Medical History:  Diagnosis Date  . ADD (attention deficit disorder)    No medications  . BV (bacterial vaginosis)    recurrent  . Eczema   . GERD (gastroesophageal reflux disease)   . Insomnia   . Orthodontics    invisalign  . Scoliosis    no issues  . Urticaria   . UTI (lower urinary tract infection)    recurrent  . Wears contact lenses     Past Surgical History:  Past Surgical History:  Procedure Laterality Date  . CERVIX LESION DESTRUCTION    . DILATION AND CURETTAGE OF UTERUS     for TAB  . ESOPHAGOGASTRODUODENOSCOPY (EGD) WITH PROPOFOL N/A 10/04/2015   Procedure: ESOPHAGOGASTRODUODENOSCOPY (EGD) WITH PROPOFOL;  Surgeon: Lucilla Lame, MD;  Location: [place];  Service: Endoscopy;  Laterality: N/A;    Past Obstetrical History:  OB History  Gravida Para Term Preterm AB Living  1   0 0 1 0  SAB TAB Ectopic Multiple  Live Births    1          # Outcome Date GA Lbr Len/2nd Weight Sex Delivery Anes PTL Lv  1 TAB             Past Gynecological History: As per HPI. History of Pap Smear(s): Yes.   Last pap 2018, which was NILM/HPV neg History of STI(s): in the remote history.  She is currently using nothing for contraception.   Social History:  Social History   Socioeconomic History  . Marital status: Single    Spouse name: Not on file  . Number of children: Not on file  . Years of education: 88  . Highest education level: Not on file  Occupational History  . Occupation: Sales promotion account executive    Comment: [place] [employer]  Social Needs  . Financial resource strain: Not on file  . Food insecurity:    Worry: Not on file    Inability: Not on file  . Transportation needs:    Medical: Not on file    Non-medical: Not on file  Tobacco Use  . Smoking status: Never Smoker  . Smokeless tobacco: Never Used  Substance and Sexual Activity  . Alcohol use: Yes    Alcohol/week: 1.2 oz    Types: 2 Glasses of wine per week    Comment: occasionally  . Drug use: No  . Sexual activity: Yes    Partners: Male    Birth control/protection: None  Lifestyle  . Physical  activity:    Days per week: Not on file    Minutes per session: Not on file  . Stress: Not on file  Relationships  . Social connections:    Talks on phone: Not on file    Gets together: Not on file    Attends religious service: Not on file    Active member of club or organization: Not on file    Attends meetings of clubs or organizations: Not on file    Relationship status: Not on file  . Intimate partner violence:    Fear of current or ex partner: Not on file    Emotionally abused: Not on file    Physically abused: Not on file    Forced sexual activity: Not on file  Other Topics Concern  . Not on file  Social History Narrative   Single.    No children.   Self Employed.   Enjoys projects around her house.     Family History:   Family History  Problem Relation Age of Onset  . Diabetes Mother   . Breast cancer Mother 64       Breast one side BRCA negative. Triple negative  . Juvenile idiopathic arthritis Father   . Eczema Father   . Diabetes Maternal Aunt   . Diabetes Paternal Aunt   . Breast cancer Paternal Grandmother   . Diabetes Paternal Grandmother   . Asthma Sister   . Asthma Brother   . Diabetes Maternal Grandmother   . Prostate cancer Maternal Grandfather   . Prostate cancer Paternal Grandfather   . Breast cancer Paternal Aunt 67  . Breast cancer Paternal Aunt 77  . Urticaria Neg Hx   . Allergic rhinitis Neg Hx   . Angioedema Neg Hx   . Immunodeficiency Neg Hx      Medications Debbie Bowen had no medications administered during this visit. Current Outpatient Medications  Medication Sig Dispense Refill  . amphetamine-dextroamphetamine (ADDERALL) 10 MG tablet Take 1 tablet (10 mg total) by mouth 2 (two) times daily. (Patient not taking: Reported on 10/13/2017) 60 tablet 0  . Boric Acid [address] 600 mg vaginally at bedtime. Use vaginally every night for two weeks then twice a week (Patient not taking: Reported on 01/18/2018) 500 g 5  . cyclobenzaprine (FLEXERIL) 10 MG tablet 1 TABLET BY MOUTH THREE TIMES A DAY AS NEEDED AS NEEDED  0  . fluconazole (DIFLUCAN) 150 MG tablet Take 1 tablet (150 mg total) by mouth daily. (Patient not taking: Reported on 01/18/2018) 1 tablet 2  . metroNIDAZOLE (FLAGYL) 500 MG tablet Take 1 tablet (500 mg total) by mouth 2 (two) times daily. (Patient not taking: Reported on 12/23/2017) 14 tablet 0  . Multiple Vitamin (MULTIVITAMIN) tablet Take 1 tablet by mouth daily.    . predniSONE (DELTASONE) 20 MG tablet TAKE 1 TABLET BY MOUTH EVERY DAY IN THE MORNING  0  . Probiotic Product (PROBIOTIC DAILY PO) Take by mouth daily.     No current facility-administered medications for this visit.     Allergies Patient has no known allergies.   Physical Exam:  BP 116/80    Pulse 72   Wt 129 lb 9.6 oz (58.8 kg)   LMP 01/04/2018   BMI 20.30 kg/m  Body mass index is 20.3 kg/m. General appearance: Well nourished, well developed female in no acute distress.  Neck:  Supple, normal appearance, and no thyromegaly  Respiratory:   Normal respiratory effort Abdomen: positive bowel sounds and no  masses, hernias; diffusely non tender to palpation, non distended Neuro/Psych:  Normal mood and affect.  Skin:  Warm and dry.  Lymphatic:  No inguinal lymphadenopathy.   Pelvic exam: is not limited by body habitus EGBUS: within normal limits, Vagina: within normal limits and with no blood or discharge in the vault, Cervix: normal appearing cervix without tenderness, discharge or lesions. Uterus:  Nonenlarged, mobile, and non tender and Adnexa:  normal adnexa and no mass, fullness, tenderness Rectovaginal: deferred  Laboratory: pt to try and void and leave sample for UPT, UCx  Radiology: none  Assessment: pt stable  Plan:  Dysmenorrhea Patient states she had an u/s about a year ago at a fertility center (when she was thinking of trying to get pregnant) and she states they didn't mention anything. Will get an updated one since it's been a year. Pt not particularly interested in hormones. I d/w her that if u/s is normal that next step would be to try some type of hormone management (especially mirena IUD) or a prescription strength NSAID before surgical options should be explored.   RTC will call pt re: u/s results and f/u next steps  Durene Romans MD Attending Center for [facility])

## 2018-01-18 NOTE — Progress Notes (Signed)
Pelvic/Abdominal pain since Feb 2018

## 2018-01-20 ENCOUNTER — Ambulatory Visit (HOSPITAL_COMMUNITY)
Admission: RE | Admit: 2018-01-20 | Discharge: 2018-01-20 | Disposition: A | Discharge status: Home / Self Care | Payer: BLUE CROSS/BLUE SHIELD | Source: Ambulatory Visit | Attending: Obstetrics and Gynecology | Admitting: Obstetrics and Gynecology

## 2018-01-20 DIAGNOSIS — D251 Intramural leiomyoma of uterus: Secondary | ICD-10-CM | POA: Diagnosis not present

## 2018-01-20 DIAGNOSIS — N83202 Unspecified ovarian cyst, left side: Secondary | ICD-10-CM | POA: Diagnosis not present

## 2018-01-20 DIAGNOSIS — N946 Dysmenorrhea, unspecified: Secondary | ICD-10-CM | POA: Diagnosis not present

## 2018-01-26 ENCOUNTER — Encounter: Payer: Self-pay | Admitting: Primary Care

## 2018-01-28 ENCOUNTER — Telehealth: Payer: Self-pay

## 2018-01-28 NOTE — Telephone Encounter (Signed)
Received call from patient she would like to know what can be done about the fibroid that was seen on her ultrasound that was perform in May.

## 2018-01-29 ENCOUNTER — Telehealth: Payer: Self-pay | Admitting: Obstetrics and Gynecology

## 2018-01-29 NOTE — Telephone Encounter (Signed)
GYN Telephone Note Patient called at [phone] and no answer and generic VM picked up. VM left asking pt to call the clinic  Durene Romans MD Attending Center for [facility] (Faculty [facility]) 01/29/2018 Time: 1021am

## 2018-02-17 ENCOUNTER — Encounter: Payer: Self-pay | Admitting: Primary Care

## 2018-02-17 ENCOUNTER — Ambulatory Visit (INDEPENDENT_AMBULATORY_CARE_PROVIDER_SITE_OTHER): Payer: BLUE CROSS/BLUE SHIELD | Admitting: Primary Care

## 2018-02-17 VITALS — BP 118/70 | HR 89 | Temp 98.3°F | Ht 67.0 in | Wt 134.5 lb

## 2018-02-17 DIAGNOSIS — G8929 Other chronic pain: Secondary | ICD-10-CM

## 2018-02-17 DIAGNOSIS — Z0000 Encounter for general adult medical examination without abnormal findings: Secondary | ICD-10-CM

## 2018-02-17 DIAGNOSIS — E785 Hyperlipidemia, unspecified: Secondary | ICD-10-CM | POA: Diagnosis not present

## 2018-02-17 DIAGNOSIS — N390 Urinary tract infection, site not specified: Secondary | ICD-10-CM

## 2018-02-17 DIAGNOSIS — M545 Low back pain, unspecified: Secondary | ICD-10-CM

## 2018-02-17 LAB — COMPREHENSIVE METABOLIC PANEL
ALK PHOS: 44 U/L (ref 39–117)
ALT: 11 U/L (ref 0–35)
AST: 19 U/L (ref 0–37)
Albumin: 4 g/dL (ref 3.5–5.2)
BILIRUBIN TOTAL: 0.4 mg/dL (ref 0.2–1.2)
BUN: 12 mg/dL (ref 6–23)
CALCIUM: 9.1 mg/dL (ref 8.4–10.5)
CO2: 30 meq/L (ref 19–32)
CREATININE: 0.72 mg/dL (ref 0.40–1.20)
Chloride: 102 mEq/L (ref 96–112)
GFR: 116.84 mL/min (ref >60.00)
GLUCOSE: 91 mg/dL (ref 70–99)
Potassium: 4.3 mEq/L (ref 3.5–5.1)
Sodium: 136 mEq/L (ref 135–145)
TOTAL PROTEIN: 7.6 g/dL (ref 6.0–8.3)

## 2018-02-17 LAB — LIPID PANEL
CHOL/HDL RATIO: 4
Cholesterol: 201 mg/dL — ABNORMAL HIGH (ref 0–200)
HDL: 54.5 mg/dL (ref >39.00)
LDL Cholesterol: 133 mg/dL — ABNORMAL HIGH (ref 0–99)
NonHDL: 146.89
Triglycerides: 71 mg/dL (ref 0.0–149.0)
VLDL: 14.2 mg/dL (ref 0.0–40.0)

## 2018-02-17 NOTE — Patient Instructions (Addendum)
Continue regular exercise.  Make sure to take a multi-vitamin daily.   Ensure you are consuming 64 ounces of water daily.  You will be contacted regarding your referral to physical therapy.  Please let us know if you have not been contacted within one week.   Stop by the lab prior to leaving today. I will notify you of your results once received.   Follow up in 1 year for your annual exam or sooner if needed.  It was a pleasure to see you today!

## 2018-02-17 NOTE — Assessment & Plan Note (Addendum)
Following with Urology, due for follow up appointment.  Completed 2 week Augmentin course and 3 month Macrobid course. Some UTI symptoms recently, none now.

## 2018-02-17 NOTE — Assessment & Plan Note (Signed)
More hip pain now, bilaterally, worse after increased activity. Exam today overall unremarkable.  Will send back to PT as this was beneficial in the past. Consider plain films if symptoms to not improve.

## 2018-02-17 NOTE — Progress Notes (Signed)
Subjective:    Patient ID: Debbie Bowen, female    DOB: Oct 01, 1980, 37 y.o.   MRN: [id number]  HPI  Debbie Bowen is a 37 year old female who presents today for complete physical.  Overall doing well except for intermittent bilateral hip pain since November 2018. At the time she was experiencing lower back pain but thinks this may have been secondary to hips. She denies low back pain, radiculopathy, numbness/tingling, weakness, trauma. She did well after physical therapy.    Immunizations: -Tetanus: Completed in 2013 -Influenza: Never completes    Diet: She endorses a healthy diet, recently went vegan Breakfast: Veggies, bread Lunch: Salad, sweet potatoes Dinner: Vegetables  Snacks: Nuts Desserts: Infrequently  Beverages: Water, green tea, pomegranate tea  Exercise: Works out at [employer] 5-6 days week Eye exam: Completed in May 2019 Dental exam: Completes semi-annually Pap Smear: Completed in September 2018   Review of Systems  Constitutional: Negative for unexpected weight change.  HENT: Negative for rhinorrhea.   Respiratory: Negative for cough and shortness of breath.   Cardiovascular: Negative for chest pain.  Gastrointestinal: Negative for constipation and diarrhea.  Genitourinary: Negative for difficulty urinating and menstrual problem.  Musculoskeletal: Negative for arthralgias and myalgias.  Skin: Negative for rash.  Allergic/Immunologic: Negative for environmental allergies.  Neurological: Negative for dizziness, numbness and headaches.  Psychiatric/Behavioral: The patient is not nervous/anxious.        Past Medical History:  Diagnosis Date  . ADD (attention deficit disorder)    No medications  . BV (bacterial vaginosis)    recurrent  . Eczema   . GERD (gastroesophageal reflux disease)   . Insomnia   . Orthodontics    invisalign  . Scoliosis    no issues  . Urticaria   . UTI (lower urinary tract infection)    recurrent  . Wears contact  lenses      Social History   Socioeconomic History  . Marital status: Single    Spouse name: Not on file  . Number of children: Not on file  . Years of education: 70  . Highest education level: Not on file  Occupational History  . 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 Financial resource strain: Not on file  . Food insecurity:    Worry: Not on file    Inability: Not on file  . Transportation needs:    Medical: Not on file    Non-medical: Not on file  Tobacco Use  . Smoking status: Never Smoker  . Smokeless tobacco: Never Used  Substance and Sexual Activity  . Alcohol use: Yes    Alcohol/week: 1.2 oz    Types: 2 Glasses of wine per week    Comment: occasionally  . Drug use: No  . Sexual activity: Yes    Partners: Male    Birth control/protection: None  Lifestyle  . Physical activity:    Days per week: Not on file    Minutes per session: Not on file  . Stress: Not on file  Relationships  . Social connections:    Talks on phone: Not on file    Gets together: Not on file    Attends religious service: Not on file    Active member of club or organization: Not on file    Attends meetings of clubs or organizations: Not on file    Relationship status: Not on file  . Intimate partner violence:    Fear of  current or ex partner: Not on file    Emotionally abused: Not on file    Physically abused: Not on file    Forced sexual activity: Not on file  Other Topics Concern  . Not on file  Social History Narrative   Single.    No children.   Self Employed.   Enjoys projects around her house.     Past Surgical History:  Procedure Laterality Date  . CERVIX LESION DESTRUCTION    . DILATION AND CURETTAGE OF UTERUS     for TAB  . ESOPHAGOGASTRODUODENOSCOPY (EGD) WITH PROPOFOL N/A 10/04/2015   Procedure: ESOPHAGOGASTRODUODENOSCOPY (EGD) WITH PROPOFOL;  Surgeon: Lucilla Lame, MD;  Location: [place];  Service: Endoscopy;   Laterality: N/A;    Family History  Problem Relation Age of Onset  . Diabetes Mother   . Breast cancer Mother 29       Breast one side BRCA negative. Triple negative  . Juvenile idiopathic arthritis Father   . Eczema Father   . Diabetes Maternal Aunt   . Diabetes Paternal Aunt   . Breast cancer Paternal Grandmother   . Diabetes Paternal Grandmother   . Asthma Sister   . Asthma Brother   . Diabetes Maternal Grandmother   . Prostate cancer Maternal Grandfather   . Prostate cancer Paternal Grandfather   . Breast cancer Paternal Aunt 45  . Breast cancer Paternal Aunt 68  . Urticaria Neg Hx   . Allergic rhinitis Neg Hx   . Angioedema Neg Hx   . Immunodeficiency Neg Hx     No Known Allergies  Current Outpatient Medications on File Prior to Visit  Medication Sig Dispense Refill  . Probiotic Product (PROBIOTIC DAILY PO) Take by mouth daily.    . Boric Acid [address] 600 mg vaginally at bedtime. Use vaginally every night for two weeks then twice a week (Patient not taking: Reported on 01/18/2018) 500 g 5  . fluconazole (DIFLUCAN) 150 MG tablet Take 1 tablet (150 mg total) by mouth daily. (Patient not taking: Reported on 01/18/2018) 1 tablet 2  . metroNIDAZOLE (FLAGYL) 500 MG tablet Take 1 tablet (500 mg total) by mouth 2 (two) times daily. (Patient not taking: Reported on 12/23/2017) 14 tablet 0   No current facility-administered medications on file prior to visit.     BP 118/70   Pulse 89   Temp 98.3 F (36.8 C) (Oral)   Ht 5' 7"  (1.702 m)   Wt 134 lb 8 oz (61 kg)   LMP 02/04/2018   SpO2 98%   BMI 21.07 kg/m    Objective:   Physical Exam  Constitutional: She is oriented to person, place, and time. She appears well-nourished.  HENT:  Mouth/Throat: No oropharyngeal exudate.  Eyes: Pupils are equal, round, and reactive to light. EOM are normal.  Neck: Neck supple. No thyromegaly present.  Cardiovascular: Normal rate and regular rhythm.  Respiratory: Effort normal and  breath sounds normal.  GI: Soft. Bowel sounds are normal. There is no tenderness.  Musculoskeletal: Normal range of motion.  Neurological: She is alert and oriented to person, place, and time.  Skin: Skin is warm and dry.  Psychiatric: She has a normal mood and affect.           Assessment & Plan:

## 2018-02-17 NOTE — Assessment & Plan Note (Addendum)
Immunizations UTD. Pap smear UTD. Commended her on dietary changes and regular exercise. Exam unremarkable. Labs pending. Follow up in 1 year for CPE.

## 2018-02-17 NOTE — Assessment & Plan Note (Signed)
Repeat lipid panel pending today.  Commended her on dietary changes.

## 2018-04-19 ENCOUNTER — Ambulatory Visit: Payer: Self-pay | Admitting: Physical Therapy

## 2018-04-20 ENCOUNTER — Ambulatory Visit: Payer: BLUE CROSS/BLUE SHIELD | Attending: Primary Care

## 2018-04-20 ENCOUNTER — Other Ambulatory Visit: Payer: Self-pay

## 2018-04-20 DIAGNOSIS — M25551 Pain in right hip: Secondary | ICD-10-CM

## 2018-04-20 DIAGNOSIS — M25652 Stiffness of left hip, not elsewhere classified: Secondary | ICD-10-CM | POA: Diagnosis present

## 2018-04-20 DIAGNOSIS — M545 Low back pain: Secondary | ICD-10-CM | POA: Insufficient documentation

## 2018-04-20 DIAGNOSIS — M25651 Stiffness of right hip, not elsewhere classified: Secondary | ICD-10-CM | POA: Diagnosis present

## 2018-04-20 DIAGNOSIS — G8929 Other chronic pain: Secondary | ICD-10-CM | POA: Diagnosis present

## 2018-04-20 DIAGNOSIS — R293 Abnormal posture: Secondary | ICD-10-CM | POA: Diagnosis present

## 2018-04-20 DIAGNOSIS — M25552 Pain in left hip: Secondary | ICD-10-CM

## 2018-04-20 DIAGNOSIS — M62830 Muscle spasm of back: Secondary | ICD-10-CM | POA: Diagnosis present

## 2018-04-20 NOTE — Therapy (Addendum)
[place], [place], [address] Phone: [phone]   Fax:  [phone]  Physical Therapy Evaluation/Discharge  Patient Details  Name: Debbie Bowen MRN: [id number] Date of Birth: September 21, 1980 Referring Provider: Alma Friendly NP   Encounter Date: 04/20/2018  PT End of Session - 04/20/18 1552    Visit Number  1    Number of Visits  8    Date for PT Re-Evaluation  05/21/18    Authorization Type  BC[employer]    PT Start Time  0300    PT Stop Time  0345    PT Time Calculation (min)  45 min    Activity Tolerance  Patient tolerated treatment well    Behavior During Therapy  [facility] for tasks assessed/performed       Past Medical History:  Diagnosis Date  . ADD (attention deficit disorder)    No medications  . BV (bacterial vaginosis)    recurrent  . Eczema   . GERD (gastroesophageal reflux disease)   . Insomnia   . Orthodontics    invisalign  . Scoliosis    no issues  . Urticaria   . UTI (lower urinary tract infection)    recurrent  . Wears contact lenses     Past Surgical History:  Procedure Laterality Date  . CERVIX LESION DESTRUCTION    . DILATION AND CURETTAGE OF UTERUS     for TAB  . ESOPHAGOGASTRODUODENOSCOPY (EGD) WITH PROPOFOL N/A 10/04/2015   Procedure: ESOPHAGOGASTRODUODENOSCOPY (EGD) WITH PROPOFOL;  Surgeon: Lucilla Lame, MD;  Location: [facility];  Service: Endoscopy;  Laterality: N/A;    There were no vitals filed for this visit.   Subjective Assessment - 04/20/18 1510    Subjective  She reports chronic back pain that was better but now worse and has hip pain more than back and  never is completely resolved.   Dry needling helped. She is not consistent with HEP with ball    Limitations  Sitting   Does not limit but sits 4-5 hours per day.     Diagnostic tests  None    Patient Stated Goals  She wants to see if     Currently in Pain?  No/denies    Pain Score  5    last  saturday had pain with walking   Pain Location  Hip    Pain Orientation  Right;Left   RT more often   Pain Descriptors / Indicators  Aching;Dull    Pain Type  Chronic pain    Pain Onset  More than a month ago    Pain Frequency  Intermittent    Aggravating Factors   Nothing specific    Pain Relieving Factors  ibuprofen    Multiple Pain Sites  No         [facility] PT Assessment - 04/20/18 0001      Assessment   Medical Diagnosis  chronic back pain.     Referring Provider  Alma Friendly NP    Onset Date/Surgical Date  --   2-3 months since pain worse   Next MD Visit  As needed    Prior Therapy  Yes      Precautions   Precautions  None      Restrictions   Weight Bearing Restrictions  No      Balance Screen   Has the patient fallen in the past 6 months  No      Prior Function   Level  of Independence  Independent      Cognition   Overall Cognitive Status  Within Functional Limits for tasks assessed      Observation/Other Assessments   Focus on Therapeutic Outcomes (FOTO)   6% limited      Posture/Postural Control   Posture Comments  incr thoracic kyphosis and rounded shoulders      ROM / Strength   AROM / PROM / Strength  AROM;Strength;PROM      AROM   AROM Assessment Site  Lumbar    Lumbar Flexion  80    Lumbar Extension  20    Lumbar - Right Side Bend  20    Lumbar - Left Side Bend  20      PROM   Overall PROM Comments  Hip IR decr bilaterally  more with hip in nuetral  in prone.       Strength   Overall Strength Comments  LE strength normal                Objective measurements completed on examination: See above findings.      [facility] PT Treatment/Exercise - 04/20/18 0001      Self-Care   Self-Care  Other Self-Care Comments    Other Self-Care Comments   tennis ball STW to gluteals             PT Education - 04/20/18 1545    Education Details  POC        PT Short Term Goals - 04/20/18 1517      PT SHORT TERM GOAL #1    Title  She will be independent with initial HEP    Time  2    Period  Weeks    Status  New      PT SHORT TERM GOAL #3   Title  She will demo understanding of good posture    Time  2    Period  Weeks    Status  New        PT Long Term Goals - 04/20/18 1557      PT LONG TERM GOAL #1   Title  She will be independent with all HEP issued    Time  4    Period  Weeks    Status  New      PT LONG TERM GOAL #2   Title  She will report pain decreased 50% or more with work and home tasks.     Time  4    Period  Weeks    Status  New      PT LONG TERM GOAL #3   Title  She will report pain as resolved     Time  4    Period  Weeks    Status  New      PT LONG TERM GOAL #4   Title  She will report working out at gym without pain.    Time  4    Period  Weeks    Status  New             Plan - 04/20/18 1553    Clinical Impression Statement  Pt reports no pain today but has days with RT> LT hip /buttock pain  that generally resolves but has been more frequent and intense in last few months. She has sittness with ER both hips . She has incr toracic kyphosis . Her strength is normal.  DN helped in past so will add to  treatment.      Clinical Presentation  Stable    Clinical Decision Making  Low    Rehab Potential  Good    PT Frequency  2x / week    PT Duration  4 weeks    PT Treatment/Interventions  Moist Heat;Iontophoresis 34m/ml Dexamethasone;Passive range of motion;Manual techniques;Patient/family education;Therapeutic exercise;Dry needling    PT Next Visit Plan  Manual ad stretching , HEP ,  modalities or DN if needed    PT Home Exercise Plan  STW with tennis balls    Consulted and Agree with Plan of Care  Patient       Patient will benefit from skilled therapeutic intervention in order to improve the following deficits and impairments:  Pain, Postural dysfunction, Decreased range of motion, Increased muscle spasms  Visit Diagnosis: Chronic bilateral low back pain without  sciatica  Abnormal posture  Muscle spasm of back  Pain in right hip  Pain in left hip  Stiffness of right hip, not elsewhere classified  Stiffness of left hip, not elsewhere classified     Problem List Patient Active Problem List   Diagnosis Date Noted  . Dysmenorrhea 01/18/2018  . Vaginal discharge 12/23/2017  . Chronic bilateral low back pain without sciatica 06/05/2017  . Dermatitis 05/19/2016  . Perennial allergic rhinitis 05/19/2016  . Allergic conjunctivitis 05/19/2016  . Preventative health care 12/31/2015  . Hyperlipidemia 12/31/2015  . Infertility, female 12/20/2015  . ADHD (attention deficit hyperactivity disorder) 12/06/2015  . Abdominal pain 12/06/2015  . Recurrent UTI 12/06/2015  . Rash and nonspecific skin eruption 12/06/2015    CDarrel HooverPT 04/20/2018, 3:59 PM  [facility] [address]Phone: [phone]  Fax:  [phone] Name: Debbie EhlerMRN: [id number] of Birth: 103/17/1982PHYSICAL THERAPY DISCHARGE SUMMARY  Visits from Start of Care: Eval only  Current functional level related to goals / functional outcomes: She no showed then canceled all appointments being out of town. She is out of her POC so needs new referral to return to PT   Remaining deficits: Unknown   Education / Equipment: NA  Plan:                                                    Patient goals were not met. Patient is being discharged due to not returning since the last visit.  ?????    SPearson ForsterPT 05/24/18

## 2018-04-20 NOTE — Patient Instructions (Signed)
Issued tennis balls for STW and she demo correct technique

## 2018-05-10 ENCOUNTER — Telehealth: Payer: Self-pay | Admitting: Physical Therapy

## 2018-05-10 ENCOUNTER — Ambulatory Visit: Payer: BLUE CROSS/BLUE SHIELD | Attending: Primary Care | Admitting: Physical Therapy

## 2018-05-10 NOTE — Telephone Encounter (Signed)
Attempted to call pt regarding missing today's session but was unable to reach pt or leave message due to mailbox being full.

## 2018-05-13 ENCOUNTER — Ambulatory Visit: Payer: BLUE CROSS/BLUE SHIELD | Admitting: Physical Therapy

## 2018-05-17 ENCOUNTER — Ambulatory Visit: Payer: BLUE CROSS/BLUE SHIELD | Admitting: Physical Therapy

## 2019-04-07 ENCOUNTER — Ambulatory Visit (INDEPENDENT_AMBULATORY_CARE_PROVIDER_SITE_OTHER): Payer: BLUE CROSS/BLUE SHIELD | Admitting: Family Medicine

## 2019-04-07 ENCOUNTER — Encounter: Payer: Self-pay | Admitting: Family Medicine

## 2019-04-07 VITALS — BP 116/77 | HR 72 | Wt 134.4 lb

## 2019-04-07 DIAGNOSIS — Z01419 Encounter for gynecological examination (general) (routine) without abnormal findings: Secondary | ICD-10-CM

## 2019-04-07 DIAGNOSIS — Z1151 Encounter for screening for human papillomavirus (HPV): Secondary | ICD-10-CM | POA: Diagnosis not present

## 2019-04-07 DIAGNOSIS — Z124 Encounter for screening for malignant neoplasm of cervix: Secondary | ICD-10-CM

## 2019-04-07 DIAGNOSIS — N979 Female infertility, unspecified: Secondary | ICD-10-CM

## 2019-04-07 DIAGNOSIS — N946 Dysmenorrhea, unspecified: Secondary | ICD-10-CM

## 2019-04-07 DIAGNOSIS — Z8481 Family history of carrier of genetic disease: Secondary | ICD-10-CM | POA: Insufficient documentation

## 2019-04-07 NOTE — Patient Instructions (Signed)
Preventive Care 21-39 Years Old, Female Preventive care refers to visits with your health care provider and lifestyle choices that can promote health and wellness. This includes:  A yearly physical exam. This may also be called an annual well check.  Regular dental visits and eye exams.  Immunizations.  Screening for certain conditions.  Healthy lifestyle choices, such as eating a healthy diet, getting regular exercise, not using drugs or products that contain nicotine and tobacco, and limiting alcohol use. What can I expect for my preventive care visit? Physical exam Your health care provider will check your:  Height and weight. This may be used to calculate body mass index (BMI), which tells if you are at a healthy weight.  Heart rate and blood pressure.  Skin for abnormal spots. Counseling Your health care provider may ask you questions about your:  Alcohol, tobacco, and drug use.  Emotional well-being.  Home and relationship well-being.  Sexual activity.  Eating habits.  Work and work environment.  Method of birth control.  Menstrual cycle.  Pregnancy history. What immunizations do I need?  Influenza (flu) vaccine  This is recommended every year. Tetanus, diphtheria, and pertussis (Tdap) vaccine  You may need a Td booster every 10 years. Varicella (chickenpox) vaccine  You may need this if you have not been vaccinated. Human papillomavirus (HPV) vaccine  If recommended by your health care provider, you may need three doses over 6 months. Measles, mumps, and rubella (MMR) vaccine  You may need at least one dose of MMR. You may also need a second dose. Meningococcal conjugate (MenACWY) vaccine  One dose is recommended if you are age 19-21 years and a first-year college student living in a residence hall, or if you have one of several medical conditions. You may also need additional booster doses. Pneumococcal conjugate (PCV13) vaccine  You may need  this if you have certain conditions and were not previously vaccinated. Pneumococcal polysaccharide (PPSV23) vaccine  You may need one or two doses if you smoke cigarettes or if you have certain conditions. Hepatitis A vaccine  You may need this if you have certain conditions or if you travel or work in places where you may be exposed to hepatitis A. Hepatitis B vaccine  You may need this if you have certain conditions or if you travel or work in places where you may be exposed to hepatitis B. Haemophilus influenzae type b (Hib) vaccine  You may need this if you have certain conditions. You may receive vaccines as individual doses or as more than one vaccine together in one shot (combination vaccines). Talk with your health care provider about the risks and benefits of combination vaccines. What tests do I need?  Blood tests  Lipid and cholesterol levels. These may be checked every 5 years starting at age 20.  Hepatitis C test.  Hepatitis B test. Screening  Diabetes screening. This is done by checking your blood sugar (glucose) after you have not eaten for a while (fasting).  Sexually transmitted disease (STD) testing.  BRCA-related cancer screening. This may be done if you have a family history of breast, ovarian, tubal, or peritoneal cancers.  Pelvic exam and Pap test. This may be done every 3 years starting at age 21. Starting at age 30, this may be done every 5 years if you have a Pap test in combination with an HPV test. Talk with your health care provider about your test results, treatment options, and if necessary, the need for more tests.   Follow these instructions at home: Eating and drinking   Eat a diet that includes fresh fruits and vegetables, whole grains, lean protein, and low-fat dairy.  Take vitamin and mineral supplements as recommended by your health care provider.  Do not drink alcohol if: ? Your health care provider tells you not to drink. ? You are  pregnant, may be pregnant, or are planning to become pregnant.  If you drink alcohol: ? Limit how much you have to 0-1 drink a day. ? Be aware of how much alcohol is in your drink. In the [place] one drink equals one 12 oz bottle of beer (355 mL), one 5 oz glass of wine (148 mL), or one 1 oz glass of hard liquor (44 mL). Lifestyle  Take daily care of your teeth and gums.  Stay active. Exercise for at least 30 minutes on 5 or more days each week.  Do not use any products that contain nicotine or tobacco, such as cigarettes, e-cigarettes, and chewing tobacco. If you need help quitting, ask your health care provider.  If you are sexually active, practice safe sex. Use a condom or other form of birth control (contraception) in order to prevent pregnancy and STIs (sexually transmitted infections). If you plan to become pregnant, see your health care provider for a preconception visit. What's next?  Visit your health care provider once a year for a well check visit.  Ask your health care provider how often you should have your eyes and teeth checked.  Stay up to date on all vaccines. This information is not intended to replace advice given to you by your health care provider. Make sure you discuss any questions you have with your health care provider. Document Released: 10/14/2001 Document Revised: 04/29/2018 Document Reviewed: 04/29/2018 Elsevier Patient Education  2020 Elsevier Inc.  

## 2019-04-07 NOTE — Assessment & Plan Note (Signed)
Advised to consider testing due to increasing surveillance and need for knowledge later.

## 2019-04-07 NOTE — Progress Notes (Signed)
Last pap 2018- normal  No sti testing  Family history of breast cancer in mother age 39. She does not wish to do genetic testing at this time.  Patient reports having some abnormal cycle with severe cramping the past couple of months.

## 2019-04-07 NOTE — Progress Notes (Signed)
  Subjective:     Debbie Bowen is a 38 y.o. female and is here for a comprehensive physical exam. The patient reports problems - new cramping. Increasing cramping especially outside of her cycles. Comes and goes. More frequent in last 1-2 months. She is following up with Dr. Kerin Perna in early September. Notes husband with low sperm count. Has strong FH of breast cancer and mom tested + for gene--she is debating getting tested.   The following portions of the patient's history were reviewed and updated as appropriate: allergies, current medications, past family history, past medical history, past social history, past surgical history and problem list.  Review of Systems Pertinent items noted in HPI and remainder of comprehensive ROS otherwise negative.   Objective:    BP 116/77   Pulse 72   Wt 134 lb 6.4 oz (61 kg)   LMP 03/14/2019   BMI 21.05 kg/m  General appearance: alert, cooperative and appears stated age Head: Normocephalic, without obvious abnormality, atraumatic Neck: no adenopathy, supple, symmetrical, trachea midline and thyroid not enlarged, symmetric, no tenderness/mass/nodules Lungs: clear to auscultation bilaterally Breasts: normal appearance, no masses or tenderness Heart: regular rate and rhythm, S1, S2 normal, no murmur, click, rub or gallop Abdomen: soft, non-tender; bowel sounds normal; no masses,  no organomegaly Pelvic: cervix normal in appearance, external genitalia normal, no adnexal masses or tenderness, no cervical motion tenderness, uterus normal size, shape, and consistency and vagina normal without discharge Extremities: extremities normal, atraumatic, no cyanosis or edema Pulses: 2+ and symmetric Skin: Skin color, texture, turgor normal. No rashes or lesions Lymph nodes: Cervical, supraclavicular, and axillary nodes normal. Neurologic: Grossly normal    Assessment:    Healthy female exam.      Plan:      Problem List Items Addressed This  Visit      Unprioritized   Infertility, female    To f/u w/ REI      Dysmenorrhea - Primary    Discuss w/ REI--? Endometriosis?      Family history of breast cancer gene mutation in first degree relative    Advised to consider testing due to increasing surveillance and need for knowledge later.       Other Visit Diagnoses    Screening for malignant neoplasm of cervix       Relevant Orders   Cytology - PAP( [place])   Encounter for gynecological examination without abnormal finding       Relevant Orders   Cytology - PAP( [place])     Return in 1 year (on 04/06/2020).  See After Visit Summary for Counseling Recommendations

## 2019-04-07 NOTE — Assessment & Plan Note (Signed)
Discuss w/ REI--? Endometriosis?

## 2019-04-07 NOTE — Assessment & Plan Note (Signed)
To f/u w/ REI

## 2019-04-12 LAB — CYTOLOGY - PAP
Diagnosis: NEGATIVE
HPV: NOT DETECTED

## 2019-05-15 DIAGNOSIS — R519 Headache, unspecified: Secondary | ICD-10-CM

## 2019-05-16 ENCOUNTER — Encounter (INDEPENDENT_AMBULATORY_CARE_PROVIDER_SITE_OTHER): Payer: Self-pay

## 2019-08-30 ENCOUNTER — Other Ambulatory Visit: Payer: Self-pay | Admitting: Primary Care

## 2019-08-30 DIAGNOSIS — E785 Hyperlipidemia, unspecified: Secondary | ICD-10-CM

## 2019-09-07 ENCOUNTER — Other Ambulatory Visit: Payer: Self-pay

## 2019-09-07 ENCOUNTER — Other Ambulatory Visit (INDEPENDENT_AMBULATORY_CARE_PROVIDER_SITE_OTHER): Payer: 59

## 2019-09-07 DIAGNOSIS — E785 Hyperlipidemia, unspecified: Secondary | ICD-10-CM

## 2019-09-07 LAB — COMPREHENSIVE METABOLIC PANEL
ALT: 14 U/L (ref 0–35)
AST: 18 U/L (ref 0–37)
Albumin: 4 g/dL (ref 3.5–5.2)
Alkaline Phosphatase: 44 U/L (ref 39–117)
BUN: 12 mg/dL (ref 6–23)
CO2: 29 mEq/L (ref 19–32)
Calcium: 9 mg/dL (ref 8.4–10.5)
Chloride: 104 mEq/L (ref 96–112)
Creatinine, Ser: 0.74 mg/dL (ref 0.40–1.20)
GFR: 105.64 mL/min (ref >60.00)
Glucose, Bld: 94 mg/dL (ref 70–99)
Potassium: 3.8 mEq/L (ref 3.5–5.1)
Sodium: 137 mEq/L (ref 135–145)
Total Bilirubin: 0.3 mg/dL (ref 0.2–1.2)
Total Protein: 7.7 g/dL (ref 6.0–8.3)

## 2019-09-07 LAB — CBC
HCT: 40.1 % (ref 36.0–46.0)
Hemoglobin: 13.2 g/dL (ref 12.0–15.0)
MCHC: 32.8 g/dL (ref 30.0–36.0)
MCV: 84.5 fl (ref 78.0–100.0)
Platelets: 294 10*3/uL (ref 150.0–400.0)
RBC: 4.75 Mil/uL (ref 3.87–5.11)
RDW: 13.5 % (ref 11.5–15.5)
WBC: 4.3 10*3/uL (ref 4.0–10.5)

## 2019-09-07 LAB — LIPID PANEL
Cholesterol: 232 mg/dL — ABNORMAL HIGH (ref 0–200)
HDL: 65.5 mg/dL (ref >39.00)
LDL Cholesterol: 150 mg/dL — ABNORMAL HIGH (ref 0–99)
NonHDL: 166.62
Total CHOL/HDL Ratio: 4
Triglycerides: 81 mg/dL (ref 0.0–149.0)
VLDL: 16.2 mg/dL (ref 0.0–40.0)

## 2019-09-14 ENCOUNTER — Other Ambulatory Visit: Payer: Self-pay

## 2019-09-14 ENCOUNTER — Encounter: Payer: Self-pay | Admitting: Primary Care

## 2019-09-14 ENCOUNTER — Ambulatory Visit (INDEPENDENT_AMBULATORY_CARE_PROVIDER_SITE_OTHER): Payer: 59 | Admitting: Primary Care

## 2019-09-14 VITALS — BP 120/82 | HR 67 | Temp 97.9°F | Ht 67.0 in | Wt 139.8 lb

## 2019-09-14 DIAGNOSIS — Z8481 Family history of carrier of genetic disease: Secondary | ICD-10-CM

## 2019-09-14 DIAGNOSIS — N390 Urinary tract infection, site not specified: Secondary | ICD-10-CM

## 2019-09-14 DIAGNOSIS — R002 Palpitations: Secondary | ICD-10-CM

## 2019-09-14 DIAGNOSIS — K219 Gastro-esophageal reflux disease without esophagitis: Secondary | ICD-10-CM

## 2019-09-14 DIAGNOSIS — Z0000 Encounter for general adult medical examination without abnormal findings: Secondary | ICD-10-CM

## 2019-09-14 DIAGNOSIS — E785 Hyperlipidemia, unspecified: Secondary | ICD-10-CM

## 2019-09-14 NOTE — Assessment & Plan Note (Signed)
Recent mammogram results pending.

## 2019-09-14 NOTE — Assessment & Plan Note (Signed)
Increase in LDL level to 150 from 133 in late 2019. Stressed the importance of regular exercise, healthy diet in order to prevent heart disease/stroke.  She will work on lifestyle modifications. Continue to monitor.

## 2019-09-14 NOTE — Assessment & Plan Note (Signed)
Recent return of symptoms, discussed OTC use of famotidine or omeprazole.

## 2019-09-14 NOTE — Progress Notes (Signed)
Subjective:    Patient ID: Debbie Bowen, female    DOB: 06-Aug-1981, 39 y.o.   MRN: [id number]  HPI  This visit occurred during the SARS-CoV-2 public health emergency.  Safety protocols were in place, including screening questions prior to the visit, additional usage of staff PPE, and extensive cleaning of exam room while observing appropriate contact time as indicated for disinfecting solutions.   Debbie Bowen is a 39 year old female who presents today for complete physical. She would also like to discuss palpitations.  She's noticed heart palpitations with normal heart rate that began intermittently over the last one month. Initially palpitations were inially infrequent but over the last one week symptoms have been daily lasting 3-4 minutes several times daily. She denies nausea, dizziness, headaches, diaphoresis. She has been under a lot of stress recently, has been feeling some anxiety. She denies caffeine consumption, OTC supplements.   Immunizations: -Tetanus: Completed in 2013 -Influenza: Declines   Diet: She endorses a healthy diet. She doesn't eat pork or beef, limited take out food.  Exercise: She is not exercising   Eye exam: Completed in 2020 Dental exam: Completes semi-annually  Mammogram: Completed in January 2021 Pap Smear: Completed in August 2020  BP Readings from Last 3 Encounters:  09/14/19 120/82  04/07/19 116/77  02/17/18 118/70     Review of Systems  Constitutional: Negative for unexpected weight change.  HENT: Negative for rhinorrhea.   Respiratory: Negative for cough and shortness of breath.   Cardiovascular: Positive for palpitations. Negative for chest pain.  Gastrointestinal: Negative for constipation and diarrhea.  Genitourinary: Negative for difficulty urinating and menstrual problem.  Musculoskeletal: Negative for arthralgias and myalgias.  Skin: Negative for rash.  Allergic/Immunologic: Negative for environmental allergies.    Neurological: Negative for dizziness, numbness and headaches.  Psychiatric/Behavioral: The patient is nervous/anxious.        Past Medical History:  Diagnosis Date  . ADD (attention deficit disorder)    No medications  . BV (bacterial vaginosis)    recurrent  . Eczema   . GERD (gastroesophageal reflux disease)   . Insomnia   . Orthodontics    invisalign  . Scoliosis    no issues  . Urticaria   . UTI (lower urinary tract infection)    recurrent  . Wears contact lenses      Social History   Socioeconomic History  . Marital status: Single    Spouse name: Not on file  . Number of children: Not on file  . Years of education: 62  . Highest education level: Not on file  Occupational History  . Occupation: Sales promotion account executive    Comment: [place] [employer]  Tobacco Use  . Smoking status: Never Smoker  . Smokeless tobacco: Never Used  Substance and Sexual Activity  . Alcohol use: Yes    Alcohol/week: 2.0 standard drinks    Types: 2 Glasses of wine per week    Comment: occasionally  . Drug use: No  . Sexual activity: Yes    Partners: Male    Birth control/protection: None  Other Topics Concern  . Not on file  Social History Narrative   Single.    No children.   Self Employed.   Enjoys projects around her house.    Social Determinants of Health   Financial Resource Strain:   . Difficulty of Paying Living Expenses: Not on file  Food Insecurity:   . Worried About Charity fundraiser in the Last Year: Not on  file  . Ran Out of Food in the Last Year: Not on file  Transportation Needs:   . Lack of Transportation (Medical): Not on file  . Lack of Transportation (Non-Medical): Not on file  Physical Activity:   . Days of Exercise per Week: Not on file  . Minutes of Exercise per Session: Not on file  Stress:   . Feeling of Stress : Not on file  Social Connections:   . Frequency of Communication with Friends and Family: Not on file  . Frequency of Social Gatherings  with Friends and Family: Not on file  . Attends Religious Services: Not on file  . Active Member of Clubs or Organizations: Not on file  . Attends Club or Organization Meetings: Not on file  . Marital Status: Not on file  Intimate Partner Violence:   . Fear of Current or Ex-Partner: Not on file  . Emotionally Abused: Not on file  . Physically Abused: Not on file  . Sexually Abused: Not on file    Past Surgical History:  Procedure Laterality Date  . CERVIX LESION DESTRUCTION    . DILATION AND CURETTAGE OF UTERUS     for TAB  . ESOPHAGOGASTRODUODENOSCOPY (EGD) WITH PROPOFOL N/A 10/04/2015   Procedure: ESOPHAGOGASTRODUODENOSCOPY (EGD) WITH PROPOFOL;  Surgeon: Darren Wohl, MD;  Location: [facility];  Service: Endoscopy;  Laterality: N/A;    Family History  Problem Relation Age of Onset  . Diabetes Mother   . Breast cancer Mother 53       Breast one side BRCA negative. Triple negative  . Juvenile idiopathic arthritis Father   . Eczema Father   . Diabetes Maternal Aunt   . Diabetes Paternal Aunt   . Breast cancer Paternal Grandmother   . Diabetes Paternal Grandmother   . Asthma Sister   . Asthma Brother   . Diabetes Maternal Grandmother   . Prostate cancer Maternal Grandfather   . Prostate cancer Paternal Grandfather   . Breast cancer Paternal Aunt 60  . Breast cancer Paternal Aunt 50  . Urticaria Neg Hx   . Allergic rhinitis Neg Hx   . Angioedema Neg Hx   . Immunodeficiency Neg Hx     No Known Allergies  Current Outpatient Medications on File Prior to Visit  Medication Sig Dispense Refill  . ibuprofen (ADVIL) 800 MG tablet Take by mouth.    . nitrofurantoin (MACRODANTIN) 50 MG capsule Take by mouth.     No current facility-administered medications on file prior to visit.    BP 120/82   Pulse 67   Temp 97.9 F (36.6 C) (Temporal)   Ht 5' 7" (1.702 m)   Wt 139 lb 12 oz (63.4 kg)   LMP 08/29/2019   SpO2 98%   BMI 21.89 kg/m    Objective:   Physical  Exam  Constitutional: She is oriented to person, place, and time. She appears well-nourished.  HENT:  Right Ear: Tympanic membrane and ear canal normal.  Left Ear: Tympanic membrane and ear canal normal.  Mouth/Throat: Oropharynx is clear and moist.  Eyes: Pupils are equal, round, and reactive to light. EOM are normal.  Cardiovascular: Normal rate and regular rhythm.  Respiratory: Effort normal and breath sounds normal.  GI: Soft. Bowel sounds are normal. There is no abdominal tenderness.  Musculoskeletal:        General: Normal range of motion.     Cervical back: Neck supple.  Neurological: She is alert and oriented to person, place, and time.   No cranial nerve deficit.  Reflex Scores:      Patellar reflexes are 2+ on the right side and 2+ on the left side. Skin: Skin is warm and dry.  Psychiatric: She has a normal mood and affect.           Assessment & Plan:

## 2019-09-14 NOTE — Assessment & Plan Note (Signed)
Tetanus UTD, declines influenza vaccine. Pap smear UTD, follows with GYN. Encouraged a healthy diet and regular exercise.  Exam today unremarkable. Labs reviewed.

## 2019-09-14 NOTE — Assessment & Plan Note (Signed)
No recent UTI or use of nitrofurantion. Continue to monitor.

## 2019-09-14 NOTE — Patient Instructions (Addendum)
Stop by the lab prior to leaving today. I will notify you of your results once received.   You will be contacted regarding your referral to cardiology.  Please let us know if you have not been contacted within two weeks.   Start exercising. You should be getting 150 minutes of moderate intensity exercise weekly.  It's important to improve your diet by reducing consumption of fast food, fried food, processed snack foods, sugary drinks. Increase consumption of fresh vegetables and fruits, whole grains, water.  Ensure you are drinking 64 ounces of water daily.  It was a pleasure to see you today!   Preventive Care 53-90 Years Old, Female Preventive care refers to visits with your health care provider and lifestyle choices that can promote health and wellness. This includes:  A yearly physical exam. This may also be called an annual well check.  Regular dental visits and eye exams.  Immunizations.  Screening for certain conditions.  Healthy lifestyle choices, such as eating a healthy diet, getting regular exercise, not using drugs or products that contain nicotine and tobacco, and limiting alcohol use. What can I expect for my preventive care visit? Physical exam Your health care provider will check your:  Height and weight. This may be used to calculate body mass index (BMI), which tells if you are at a healthy weight.  Heart rate and blood pressure.  Skin for abnormal spots. Counseling Your health care provider may ask you questions about your:  Alcohol, tobacco, and drug use.  Emotional well-being.  Home and relationship well-being.  Sexual activity.  Eating habits.  Work and work Statistician.  Method of birth control.  Menstrual cycle.  Pregnancy history. What immunizations do I need?  Influenza (flu) vaccine  This is recommended every year. Tetanus, diphtheria, and pertussis (Tdap) vaccine  You may need a Td booster every 10 years. Varicella (chickenpox)  vaccine  You may need this if you have not been vaccinated. Human papillomavirus (HPV) vaccine  If recommended by your health care provider, you may need three doses over 6 months. Measles, mumps, and rubella (MMR) vaccine  You may need at least one dose of MMR. You may also need a second dose. Meningococcal conjugate (MenACWY) vaccine  One dose is recommended if you are age 50-21 years and a first-year college student living in a residence hall, or if you have one of several medical conditions. You may also need additional booster doses. Pneumococcal conjugate (PCV13) vaccine  You may need this if you have certain conditions and were not previously vaccinated. Pneumococcal polysaccharide (PPSV23) vaccine  You may need one or two doses if you smoke cigarettes or if you have certain conditions. Hepatitis A vaccine  You may need this if you have certain conditions or if you travel or work in places where you may be exposed to hepatitis A. Hepatitis B vaccine  You may need this if you have certain conditions or if you travel or work in places where you may be exposed to hepatitis B. Haemophilus influenzae type b (Hib) vaccine  You may need this if you have certain conditions. You may receive vaccines as individual doses or as more than one vaccine together in one shot (combination vaccines). Talk with your health care provider about the risks and benefits of combination vaccines. What tests do I need?  Blood tests  Lipid and cholesterol levels. These may be checked every 5 years starting at age 36.  Hepatitis C test.  Hepatitis B test. Screening  Diabetes screening. This is done by checking your blood sugar (glucose) after you have not eaten for a while (fasting).  Sexually transmitted disease (STD) testing.  BRCA-related cancer screening. This may be done if you have a family history of breast, ovarian, tubal, or peritoneal cancers.  Pelvic exam and Pap test. This may be  done every 3 years starting at age 60. Starting at age 31, this may be done every 5 years if you have a Pap test in combination with an HPV test. Talk with your health care provider about your test results, treatment options, and if necessary, the need for more tests. Follow these instructions at home: Eating and drinking   Eat a diet that includes fresh fruits and vegetables, whole grains, lean protein, and low-fat dairy.  Take vitamin and mineral supplements as recommended by your health care provider.  Do not drink alcohol if: ? Your health care provider tells you not to drink. ? You are pregnant, may be pregnant, or are planning to become pregnant.  If you drink alcohol: ? Limit how much you have to 0-1 drink a day. ? Be aware of how much alcohol is in your drink. In the [place] one drink equals one 12 oz bottle of beer (355 mL), one 5 oz glass of wine (148 mL), or one 1 oz glass of hard liquor (44 mL). Lifestyle  Take daily care of your teeth and gums.  Stay active. Exercise for at least 30 minutes on 5 or more days each week.  Do not use any products that contain nicotine or tobacco, such as cigarettes, e-cigarettes, and chewing tobacco. If you need help quitting, ask your health care provider.  If you are sexually active, practice safe sex. Use a condom or other form of birth control (contraception) in order to prevent pregnancy and STIs (sexually transmitted infections). If you plan to become pregnant, see your health care provider for a preconception visit. What's next?  Visit your health care provider once a year for a well check visit.  Ask your health care provider how often you should have your eyes and teeth checked.  Stay up to date on all vaccines. This information is not intended to replace advice given to you by your health care provider. Make sure you discuss any questions you have with your health care provider. Document Revised: 04/29/2018 Document Reviewed:  04/29/2018 Elsevier Patient Education  2020 [employer].

## 2019-09-14 NOTE — Assessment & Plan Note (Addendum)
Acute for the last month, more frequent now. Could be anxiety provoked but she does experience symptoms without periods of stress. No caffeine consumption.   Recent labs including CBC, CMP unremarkable. Add TSH today.  ECG today with NSR with rate of 63. No ST changes, PAC/PVC. Appears very similar to ECG from 2019.  Referral to cardiology placed for perhaps Holter monitor and evaluation.   Will treat esophageal reflux.

## 2019-09-15 LAB — TSH: TSH: 1.64 u[IU]/mL (ref 0.35–4.50)

## 2019-09-22 ENCOUNTER — Encounter: Payer: Self-pay | Admitting: Family Medicine

## 2019-09-22 ENCOUNTER — Other Ambulatory Visit: Payer: Self-pay

## 2019-09-22 ENCOUNTER — Ambulatory Visit: Payer: 59 | Admitting: Family Medicine

## 2019-09-22 DIAGNOSIS — R519 Headache, unspecified: Secondary | ICD-10-CM

## 2019-09-22 HISTORY — DX: Headache, unspecified: R51.9

## 2019-09-22 MED ORDER — MELOXICAM 15 MG PO TABS: 15.0000 mg | ORAL_TABLET | Freq: Every day | ORAL | 0 refills | Status: DC | PRN

## 2019-09-22 NOTE — Patient Instructions (Signed)
Take the meloxicam daily with food for 4-5 days (it should help with period also)  Then take as needed   Use ice over painful area (heat is ok also)  Think about bite guard from your dentist   Avoid excessive chewing  Avoid gum  Use a straw for now  Give your jaw a break   If worse / symptoms change -call  If rash or skin change or swelling-call  Update if not starting to improve in a week or if worsening

## 2019-09-22 NOTE — Progress Notes (Signed)
Subjective:    Patient ID: [name], female    DOB: August 10, 1981, 39 y.o.   MRN: [id number]  This visit occurred during the SARS-CoV-2 public health emergency.  Safety protocols were in place, including screening questions prior to the visit, additional usage of staff PPE, and extensive cleaning of exam room while observing appropriate contact time as indicated for disinfecting solutions.    HPI 39 yo pt of NP Clark presents with neck and facial pain   Wt Readings from Last 3 Encounters:  09/22/19 136 lb 7 oz (61.9 kg)  09/14/19 139 lb 12 oz (63.4 kg)  04/07/19 134 lb 6.4 oz (61 kg)   21.37 kg/m   Woke up at 2 am with pain in R neck under jaw  Then shooting pain from temple to jaw  Took ibuprofen and it helped  4:30   Feels like she is on the edge of it   No swelling visible - feels it under jaw  No fever or resp symptoms No ST No ear pain   She has allergies - her nose is actually better than usual lately   Takes zyrtec 10 mg every other day  -was taking it for hives in the past and nasal allergies   No dental problems  No scalp problems or hair loss  Has never had mono  Never had a LN problem   She gets headaches periodically   No vision change or blurred vision or eye pain   She does grind jaw  No h/o TMJ Always as lines on inside of cheeks from grind/clench   Patient Active Problem List   Diagnosis Date Noted  . Facial pain 09/22/2019  . GERD (gastroesophageal reflux disease) 09/14/2019  . Palpitations 09/14/2019  . Family history of breast cancer gene mutation in first degree relative 04/07/2019  . Dysmenorrhea 01/18/2018  . Chronic bilateral low back pain without sciatica 06/05/2017  . Dermatitis 05/19/2016  . Perennial allergic rhinitis 05/19/2016  . Preventative health care 12/31/2015  . Hyperlipidemia 12/31/2015  . Infertility, female 12/20/2015  . ADHD (attention deficit hyperactivity disorder) 12/06/2015  . Recurrent UTI 12/06/2015    Past Medical History:  Diagnosis Date  . ADD (attention deficit disorder)    No medications  . BV (bacterial vaginosis)    recurrent  . Eczema   . GERD (gastroesophageal reflux disease)   . Insomnia   . Orthodontics    invisalign  . Scoliosis    no issues  . Urticaria   . UTI (lower urinary tract infection)    recurrent  . Wears contact lenses    Past Surgical History:  Procedure Laterality Date  . CERVIX LESION DESTRUCTION    . DILATION AND CURETTAGE OF UTERUS     for TAB  . ESOPHAGOGASTRODUODENOSCOPY (EGD) WITH PROPOFOL N/A 10/04/2015   Procedure: ESOPHAGOGASTRODUODENOSCOPY (EGD) WITH PROPOFOL;  Surgeon: Lucilla Lame, MD;  Location: [facility];  Service: Endoscopy;  Laterality: N/A;   Social History   Tobacco Use  . Smoking status: Never Smoker  . Smokeless tobacco: Never Used  Substance Use Topics  . Alcohol use: Yes    Alcohol/week: 2.0 standard drinks    Types: 2 Glasses of wine per week    Comment: occasionally  . Drug use: No   Family History  Problem Relation Age of Onset  . Diabetes Mother   . Breast cancer Mother 7       Breast one side BRCA negative. Triple negative  . Juvenile  idiopathic arthritis Father   . Eczema Father   . Diabetes Maternal Aunt   . Diabetes Paternal Aunt   . Breast cancer Paternal Grandmother   . Diabetes Paternal Grandmother   . Asthma Sister   . Asthma Brother   . Diabetes Maternal Grandmother   . Prostate cancer Maternal Grandfather   . Prostate cancer Paternal Grandfather   . Breast cancer Paternal Aunt 22  . Breast cancer Paternal Aunt 8  . Urticaria Neg Hx   . Allergic rhinitis Neg Hx   . Angioedema Neg Hx   . Immunodeficiency Neg Hx    No Known Allergies Current Outpatient Medications on File Prior to Visit  Medication Sig Dispense Refill  . nitrofurantoin (MACRODANTIN) 50 MG capsule Take by mouth.     No current facility-administered medications on file prior to visit.    Review of Systems    Constitutional: Negative for activity change, appetite change, fatigue, fever and unexpected weight change.  HENT: Negative for congestion, ear discharge, ear pain, facial swelling, hearing loss, mouth sores, postnasal drip, rhinorrhea, sinus pressure, sinus pain, sneezing, sore throat, tinnitus, trouble swallowing and voice change.   Eyes: Negative for pain, redness and visual disturbance.  Respiratory: Negative for cough, chest tightness, shortness of breath and wheezing.   Cardiovascular: Negative for chest pain and palpitations.  Gastrointestinal: Negative for abdominal pain, blood in stool, constipation and diarrhea.  Endocrine: Negative for polydipsia and polyuria.  Genitourinary: Negative for dysuria, frequency and urgency.  Musculoskeletal: Negative for arthralgias, back pain and myalgias.  Skin: Negative for pallor and rash.  Allergic/Immunologic: Negative for environmental allergies.  Neurological: Negative for dizziness, syncope and headaches.  Hematological: Negative for adenopathy. Does not bruise/bleed easily.  Psychiatric/Behavioral: Negative for decreased concentration and dysphoric mood. The patient is not nervous/anxious.        Objective:   Physical Exam Constitutional:      General: She is not in acute distress.    Appearance: Normal appearance. She is normal weight. She is not ill-appearing, toxic-appearing or diaphoretic.  HENT:     Head: Normocephalic and atraumatic.     Comments: Mildly tender over R TM joint with lateral jaw movement  Some mild crepitus but no dislocation or popping No swelling       Right Ear: Tympanic membrane, ear canal and external ear normal.     Left Ear: Tympanic membrane, ear canal and external ear normal.     Nose: Nose normal. No congestion or rhinorrhea.     Mouth/Throat:     Mouth: Mucous membranes are moist.     Pharynx: Oropharynx is clear. No oropharyngeal exudate or posterior oropharyngeal erythema.     Comments: No mouth  or dental lesions  Some horiz lines in buccal mucosa corresponding to bite  Eyes:     General: No scleral icterus.    Extraocular Movements: Extraocular movements intact.     Conjunctiva/sclera: Conjunctivae normal.     Pupils: Pupils are equal, round, and reactive to light.  Neck:     Vascular: No carotid bruit.  Cardiovascular:     Rate and Rhythm: Normal rate and regular rhythm.     Heart sounds: Normal heart sounds.  Pulmonary:     Effort: Pulmonary effort is normal. No respiratory distress.     Breath sounds: Normal breath sounds. No wheezing or rales.  Musculoskeletal:     Cervical back: Normal range of motion and neck supple. No rigidity or tenderness.  Lymphadenopathy:  Head:     Right side of head: No submental, submandibular, tonsillar, preauricular, posterior auricular or occipital adenopathy.     Left side of head: No submental, submandibular, tonsillar, preauricular, posterior auricular or occipital adenopathy.     Cervical: No cervical adenopathy.     Right cervical: No superficial, deep or posterior cervical adenopathy.    Left cervical: No superficial, deep or posterior cervical adenopathy.     Upper Body:     Right upper body: No supraclavicular or axillary adenopathy.     Left upper body: No supraclavicular or axillary adenopathy.  Skin:    General: Skin is warm and dry.     Coloration: Skin is not pale.     Findings: No erythema or rash.  Neurological:     Mental Status: She is alert.     Cranial Nerves: No cranial nerve deficit.     Sensory: No sensory deficit.     Motor: No weakness.  Psychiatric:        Mood and Affect: Mood normal.           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

## 2019-09-22 NOTE — Assessment & Plan Note (Signed)
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

## 2019-10-03 ENCOUNTER — Other Ambulatory Visit: Payer: Self-pay

## 2019-10-03 ENCOUNTER — Encounter: Payer: Self-pay | Admitting: Cardiology

## 2019-10-03 ENCOUNTER — Ambulatory Visit (INDEPENDENT_AMBULATORY_CARE_PROVIDER_SITE_OTHER): Payer: 59 | Admitting: Cardiology

## 2019-10-03 ENCOUNTER — Ambulatory Visit (INDEPENDENT_AMBULATORY_CARE_PROVIDER_SITE_OTHER): Payer: 59

## 2019-10-03 VITALS — BP 110/78 | HR 98 | Ht 67.0 in | Wt 136.0 lb

## 2019-10-03 DIAGNOSIS — I493 Ventricular premature depolarization: Secondary | ICD-10-CM

## 2019-10-03 DIAGNOSIS — R009 Unspecified abnormalities of heart beat: Secondary | ICD-10-CM | POA: Diagnosis not present

## 2019-10-03 NOTE — Progress Notes (Signed)
Cardiology Office Note:    Date:  10/03/2019   ID:  Debbie Bowen, DOB 23-Oct-1980, MRN [id number]  PCP:  Pleas Koch, NP  Cardiologist:  Kate Sable, MD  Electrophysiologist:  None   Referring MD: Pleas Koch, NP   Chief Complaint  Patient presents with  . New Patient (Initial Visit)    Palpitations; Meds verbally reviewed with patient.   Debbie Bowen is a 39 y.o. female who is being seen today for the evaluation of abnormal heartbeat at the request of Pleas Koch, NP.   History of Present Illness:    Debbie Bowen is a 39 y.o. female with a hx of ADHD, GERD who presents due to abnormal heartbeat.  Patient states symptoms have been ongoing for a month now and have worsened over the past 2 weeks.  She describes symptoms as a sensation of feeling her heart beating stronger and feels more prominent.  She has cut back on drinking coffee because it gives her migraines.  She denies chest pain, shortness of breath, dizziness, orthopnea, syncope.  She denies any history of heart disease or family history of heart disease.  She states being stressed of late due to personal and other family issues.  Her mother was recently diagnosed with cancer and her husband has some health ailments.  Past Medical History:  Diagnosis Date  . ADD (attention deficit disorder)    No medications  . BV (bacterial vaginosis)    recurrent  . Eczema   . GERD (gastroesophageal reflux disease)   . Insomnia   . Orthodontics    invisalign  . Scoliosis    no issues  . Urticaria   . UTI (lower urinary tract infection)    recurrent  . Wears contact lenses     Past Surgical History:  Procedure Laterality Date  . CERVIX LESION DESTRUCTION    . DILATION AND CURETTAGE OF UTERUS     for TAB  . ESOPHAGOGASTRODUODENOSCOPY (EGD) WITH PROPOFOL N/A 10/04/2015   Procedure: ESOPHAGOGASTRODUODENOSCOPY (EGD) WITH PROPOFOL;  Surgeon: Lucilla Lame, MD;  Location: [facility];  Service: Endoscopy;  Laterality: N/A;    Current Medications: Current Meds  Medication Sig  . ibuprofen (ADVIL) 200 MG tablet Take 200 mg by mouth as needed.  Marland Kitchen letrozole (FEMARA) 2.5 MG tablet Take 2.5 mg by mouth as directed. Take for 5 days then off 1 month and repeat as directed  . nitrofurantoin (MACRODANTIN) 50 MG capsule Take by mouth as needed.      Allergies:   Patient has no known allergies.   Social History   Socioeconomic History  . Marital status: Single    Spouse name: Not on file  . Number of children: Not on file  . Years of education: 29  . Highest education level: Not on file  Occupational History  . Occupation: Sales promotion account executive    Comment: [employer]  Tobacco Use  . Smoking status: Never Smoker  . Smokeless tobacco: Never Used  Substance and Sexual Activity  . Alcohol use: Yes    Alcohol/week: 2.0 standard drinks    Types: 2 Glasses of wine per week    Comment: occasionally  . Drug use: No  . Sexual activity: Yes    Partners: Male    Birth control/protection: None  Other Topics Concern  . Not on file  Social History Narrative   Single.    No children.   Self Employed.   Enjoys projects around her  house.    Social Determinants of Health   Financial Resource Strain:   . Difficulty of Paying Living Expenses: Not on file  Food Insecurity:   . Worried About Charity fundraiser in the Last Year: Not on file  . Ran Out of Food in the Last Year: Not on file  Transportation Needs:   . Lack of Transportation (Medical): Not on file  . Lack of Transportation (Non-Medical): Not on file  Physical Activity:   . Days of Exercise per Week: Not on file  . Minutes of Exercise per Session: Not on file  Stress:   . Feeling of Stress : Not on file  Social Connections:   . Frequency of Communication with Friends and Family: Not on file  . Frequency of Social Gatherings with Friends and Family: Not on file  . Attends Religious  Services: Not on file  . Active Member of Clubs or Organizations: Not on file  . Attends Archivist Meetings: Not on file  . Marital Status: Not on file     Family History: The patient's family history includes Asthma in her brother and sister; Breast cancer in her paternal grandmother; Breast cancer (age of onset: 69) in her paternal aunt; Breast cancer (age of onset: 21) in her mother; Breast cancer (age of onset: 70) in her paternal aunt; Diabetes in her maternal aunt, maternal grandmother, mother, paternal aunt, and paternal grandmother; Eczema in her father; Juvenile idiopathic arthritis in her father; Prostate cancer in her maternal grandfather and paternal grandfather. There is no history of Urticaria, Allergic rhinitis, Angioedema, or Immunodeficiency.  ROS:   Please see the history of present illness.     All other systems reviewed and are negative.  EKGs/Labs/Other Studies Reviewed:    The following studies were reviewed today:   EKG:  EKG is  ordered today.  The ekg ordered today demonstrates normal sinus rhythm, occasional PVCs noted.  Recent Labs: 09/07/2019: ALT 14; BUN 12; Creatinine, Ser 0.74; Hemoglobin 13.2; Platelets 294.0; Potassium 3.8; Sodium 137 09/14/2019: TSH 1.64  Recent Lipid Panel    Component Value Date/Time   CHOL 232 (H) 09/07/2019 0803   CHOL 208 (H) 12/21/2015 1341   TRIG 81.0 09/07/2019 0803   HDL 65.50 09/07/2019 0803   HDL 58 12/21/2015 1341   CHOLHDL 4 09/07/2019 0803   VLDL 16.2 09/07/2019 0803   LDLCALC 150 (H) 09/07/2019 0803   LDLCALC 131 (H) 12/21/2015 1341    Physical Exam:    VS:  BP 110/78 (BP Location: Left Arm, Patient Position: Sitting, Cuff Size: Normal)   Pulse 98   Ht 5\' 7"  (1.702 m)   Wt 136 lb (61.7 kg)   SpO2 99%   BMI 21.30 kg/m     Wt Readings from Last 3 Encounters:  10/03/19 136 lb (61.7 kg)  09/22/19 136 lb 7 oz (61.9 kg)  09/14/19 139 lb 12 oz (63.4 kg)     GEN:  Well nourished, well developed in  no acute distress HEENT: Normal NECK: No JVD; No carotid bruits LYMPHATICS: No lymphadenopathy CARDIAC: RRR, no murmurs, rubs, gallops RESPIRATORY:  Clear to auscultation without rales, wheezing or rhonchi  ABDOMEN: Soft, non-tender, non-distended MUSCULOSKELETAL:  No edema; No deformity  SKIN: Warm and dry NEUROLOGIC:  Alert and oriented x 3 PSYCHIATRIC:  Normal affect   ASSESSMENT:    1. PVC (premature ventricular contraction)   2. Heart beat abnormality    PLAN:    In order of problems listed  above:  Patient with prominent heartbeat.  EKG shows normal sinus with occasional PVCs.  Unsure if PVCs are triggering symptoms or symptoms may be related to stress/anxiety.  She does not have any cardiac risk factors and or cardiac symptoms otherwise.  Will evaluate with a cardiac monitor to determine if any underlying significant arrhythmias are noted.  If cardiac monitor turns out benign, will consider other etiologies including stress or anxiety.  Follow-up after cardiac monitor in a month.  Total encounter time more than 60 minutes  Greater than 50% was spent in counseling and coordination of care with the patient including explaining PVCs, reason for diagnostic tests, etiology of arrhythmias.   This note was generated in part or whole with voice recognition software. Voice recognition is usually quite accurate but there are transcription errors that can and very often do occur. I apologize for any typographical errors that were not detected and corrected.  Medication Adjustments/Labs and Tests Ordered: Current medicines are reviewed at length with the patient today.  Concerns regarding medicines are outlined above.  Orders Placed This Encounter  Procedures  . LONG TERM MONITOR (3-14 DAYS)  . EKG 12-Lead   No orders of the defined types were placed in this encounter.   Patient Instructions  Medication Instructions:  - Your physician recommends that you continue on your current  medications as directed. Please refer to the Current Medication list given to you today.  *If you need a refill on your cardiac medications before your next appointment, please call your pharmacy*  Lab Work: - none ordered  If you have labs (blood work) drawn today and your tests are completely normal, you will receive your results only by: Marland Kitchen MyChart Message (if you have MyChart) OR . A paper copy in the mail If you have any lab test that is abnormal or we need to change your treatment, we will call you to review the results.  Testing/Procedures: - Your physician has recommended that you wear a 14 day Zio monitor (placed in office today). This monitor is a medical device that records the heart's electrical activity. Doctors most often use these monitors to diagnose arrhythmias. Arrhythmias are problems with the speed or rhythm of the heartbeat. The monitor is a small device applied to your chest. You can wear one while you do your normal daily activities. While wearing this monitor if you have any symptoms to push the button and record what you felt. Once you have worn this monitor for the period of time provider prescribed (Usually 14 days), you will return the monitor device in the postage paid box. Once it is returned they will download the data collected and provide [place] with a report which the provider will then review and we will call you with those results. Important tips:  1. Avoid showering during the first 24 hours of wearing the monitor. 2. Avoid excessive sweating to help maximize wear time. 3. Do not submerge the device, no hot tubs, and no swimming pools. 4. Keep any lotions or oils away from the patch. 5. After 24 hours you may shower with the patch on. Take brief showers with your back facing the shower head.  6. Do not remove patch once it has been placed because that will interrupt data and decrease adhesive wear time. 7. Push the button when you have any symptoms and write down  what you were feeling. 8. Once you have completed wearing your monitor, remove and place into box which has postage paid  and place in your outgoing mailbox.  9. If for some reason you have misplaced your box then call our office and we can provide another box and/or mail it off for you.        Follow-Up: At [facility], you and your health needs are our priority.  As part of our continuing mission to provide you with exceptional heart care, we have created designated Provider Care Teams.  These Care Teams include your primary Cardiologist (physician) and Advanced Practice Providers (APPs -  Physician Assistants and Nurse Practitioners) who all work together to provide you with the care you need, when you need it.  Your next appointment:   1 month(s)  The format for your next appointment:   In Person  Provider:   Kate Sable, MD  Other Instructions n/a     Signed, Kate Sable, MD  10/03/2019 3:37 PM    [place]

## 2019-10-03 NOTE — Patient Instructions (Signed)
Medication Instructions:  - Your physician recommends that you continue on your current medications as directed. Please refer to the Current Medication list given to you today.  *If you need a refill on your cardiac medications before your next appointment, please call your pharmacy*  Lab Work: - none ordered  If you have labs (blood work) drawn today and your tests are completely normal, you will receive your results only by: Marland Kitchen MyChart Message (if you have MyChart) OR . A paper copy in the mail If you have any lab test that is abnormal or we need to change your treatment, we will call you to review the results.  Testing/Procedures: - Your physician has recommended that you wear a 14 day Zio monitor (placed in office today). This monitor is a medical device that records the heart's electrical activity. Doctors most often use these monitors to diagnose arrhythmias. Arrhythmias are problems with the speed or rhythm of the heartbeat. The monitor is a small device applied to your chest. You can wear one while you do your normal daily activities. While wearing this monitor if you have any symptoms to push the button and record what you felt. Once you have worn this monitor for the period of time provider prescribed (Usually 14 days), you will return the monitor device in the postage paid box. Once it is returned they will download the data collected and provide [place] with a report which the provider will then review and we will call you with those results. Important tips:  1. Avoid showering during the first 24 hours of wearing the monitor. 2. Avoid excessive sweating to help maximize wear time. 3. Do not submerge the device, no hot tubs, and no swimming pools. 4. Keep any lotions or oils away from the patch. 5. After 24 hours you may shower with the patch on. Take brief showers with your back facing the shower head.  6. Do not remove patch once it has been placed because that will interrupt data and  decrease adhesive wear time. 7. Push the button when you have any symptoms and write down what you were feeling. 8. Once you have completed wearing your monitor, remove and place into box which has postage paid and place in your outgoing mailbox.  9. If for some reason you have misplaced your box then call our office and we can provide another box and/or mail it off for you.        Follow-Up: At [facility], you and your health needs are our priority.  As part of our continuing mission to provide you with exceptional heart care, we have created designated Provider Care Teams.  These Care Teams include your primary Cardiologist (physician) and Advanced Practice Providers (APPs -  Physician Assistants and Nurse Practitioners) who all work together to provide you with the care you need, when you need it.  Your next appointment:   1 month(s)  The format for your next appointment:   In Person  Provider:   Kate Sable, MD  Other Instructions n/a

## 2019-10-21 ENCOUNTER — Other Ambulatory Visit: Payer: Self-pay | Admitting: Family Medicine

## 2019-10-21 NOTE — Telephone Encounter (Signed)
Refill denied. This was for an acute visit.

## 2019-10-21 NOTE — Telephone Encounter (Signed)
Last prescribed on 09/22/2019 by Dr Glori Bickers . Last appointment on 09/22/2019 with Dr Glori Bickers. No future appointment

## 2019-10-31 ENCOUNTER — Other Ambulatory Visit: Payer: Self-pay

## 2019-10-31 ENCOUNTER — Encounter: Payer: Self-pay | Admitting: Cardiology

## 2019-10-31 ENCOUNTER — Ambulatory Visit (INDEPENDENT_AMBULATORY_CARE_PROVIDER_SITE_OTHER): Payer: 59 | Admitting: Cardiology

## 2019-10-31 VITALS — BP 110/80 | HR 72 | Ht 67.0 in | Wt 137.0 lb

## 2019-10-31 DIAGNOSIS — I493 Ventricular premature depolarization: Secondary | ICD-10-CM

## 2019-10-31 DIAGNOSIS — R009 Unspecified abnormalities of heart beat: Secondary | ICD-10-CM | POA: Diagnosis not present

## 2019-10-31 NOTE — Progress Notes (Signed)
Cardiology Office Note:    Date:  10/31/2019   ID:  Debbie Bowen, DOB [birth year], MRN [id number]   PCP:  Pleas Koch, NP  Cardiologist:  Kate Sable, MD  Electrophysiologist:  None   Referring MD: Pleas Koch, NP   Chief Complaint  Patient presents with  . Other    1 month follow up. Meds reviewed verbally with patient.      History of Present Illness:    Debbie Bowen is a 39 y.o. female with a hx of ADHD, GERD who presents for follow-up.  She was last seen due to a month-long worsening abnormal heartbeat.  Heartbeats where stronger and more prominent.  She has no history of heart disease.  Cardiac monitor was ordered to evaluate presence of arrhythmias as contributing factor.  She now presents for results.  Patient states her symptoms of abnormal heartbeat have improved since the last visit.  Past Medical History:  Diagnosis Date  . ADD (attention deficit disorder)    No medications  . BV (bacterial vaginosis)    recurrent  . Eczema   . GERD (gastroesophageal reflux disease)   . Insomnia   . Orthodontics    invisalign  . Scoliosis    no issues  . Urticaria   . UTI (lower urinary tract infection)    recurrent  . Wears contact lenses     Past Surgical History:  Procedure Laterality Date  . CERVIX LESION DESTRUCTION    . DILATION AND CURETTAGE OF UTERUS     for TAB  . ESOPHAGOGASTRODUODENOSCOPY (EGD) WITH PROPOFOL N/A 10/04/2015   Procedure: ESOPHAGOGASTRODUODENOSCOPY (EGD) WITH PROPOFOL;  Surgeon: Lucilla Lame, MD;  Location: [facility];  Service: Endoscopy;  Laterality: N/A;    Current Medications: Current Meds  Medication Sig  . ibuprofen (ADVIL) 200 MG tablet Take 200 mg by mouth as needed.  Marland Kitchen letrozole (FEMARA) 2.5 MG tablet Take 2.5 mg by mouth as directed. Take for 5 days then off 1 month and repeat as directed  . nitrofurantoin (MACRODANTIN) 50 MG capsule Take by mouth as needed.      Allergies:   Patient  has no known allergies.   Social History   Socioeconomic History  . Marital status: Single    Spouse name: Not on file  . Number of children: Not on file  . Years of education: 12  . Highest education level: Not on file  Occupational History  . Occupation: Sales promotion account executive    Comment: [employer]  Tobacco Use  . Smoking status: Never Smoker  . Smokeless tobacco: Never Used  Substance and Sexual Activity  . Alcohol use: Yes    Alcohol/week: 2.0 standard drinks    Types: 2 Glasses of wine per week    Comment: occasionally  . Drug use: No  . Sexual activity: Yes    Partners: Male    Birth control/protection: None  Other Topics Concern  . Not on file  Social History Narrative   Single.    No children.   Self Employed.   Enjoys projects around her house.    Social Determinants of Health   Financial Resource Strain:   . Difficulty of Paying Living Expenses: Not on file  Food Insecurity:   . Worried About Charity fundraiser in the Last Year: Not on file  . Ran Out of Food in the Last Year: Not on file  Transportation Needs:   . Lack of Transportation (Medical): Not on file  .  Lack of Transportation (Non-Medical): Not on file  Physical Activity:   . Days of Exercise per Week: Not on file  . Minutes of Exercise per Session: Not on file  Stress:   . Feeling of Stress : Not on file  Social Connections:   . Frequency of Communication with Friends and Family: Not on file  . Frequency of Social Gatherings with Friends and Family: Not on file  . Attends Religious Services: Not on file  . Active Member of Clubs or Organizations: Not on file  . Attends Archivist Meetings: Not on file  . Marital Status: Not on file     Family History: The patient's family history includes Asthma in her brother and sister; Breast cancer in her paternal grandmother; Breast cancer (age of onset: 52) in her paternal aunt; Breast cancer (age of onset: 68) in her mother; Breast  cancer (age of onset: 83) in her paternal aunt; Diabetes in her maternal aunt, maternal grandmother, mother, paternal aunt, and paternal grandmother; Eczema in her father; Juvenile idiopathic arthritis in her father; Prostate cancer in her maternal grandfather and paternal grandfather. There is no history of Urticaria, Allergic rhinitis, Angioedema, or Immunodeficiency.  ROS:   Please see the history of present illness.     All other systems reviewed and are negative.  EKGs/Labs/Other Studies Reviewed:    The following studies were reviewed today:   EKG:  EKG is  ordered today.  The ekg ordered today demonstrates normal sinus rhythm, occasional PVCs noted.  Recent Labs: 09/07/2019: ALT 14; BUN 12; Creatinine, Ser 0.74; Hemoglobin 13.2; Platelets 294.0; Potassium 3.8; Sodium 137 09/14/2019: TSH 1.64  Recent Lipid Panel    Component Value Date/Time   CHOL 232 (H) 09/07/2019 0803   CHOL 208 (H) 12/21/2015 1341   TRIG 81.0 09/07/2019 0803   HDL 65.50 09/07/2019 0803   HDL 58 12/21/2015 1341   CHOLHDL 4 09/07/2019 0803   VLDL 16.2 09/07/2019 0803   LDLCALC 150 (H) 09/07/2019 0803   LDLCALC 131 (H) 12/21/2015 1341    Physical Exam:    VS:  BP 110/80 (BP Location: Left Arm, Patient Position: Sitting, Cuff Size: Normal)   Pulse 72   Ht 5\' 7"  (1.702 m)   Wt 137 lb (62.1 kg)   SpO2 98%   BMI 21.46 kg/m     Wt Readings from Last 3 Encounters:  10/31/19 137 lb (62.1 kg)  10/03/19 136 lb (61.7 kg)  09/22/19 136 lb 7 oz (61.9 kg)     GEN:  Well nourished, well developed in no acute distress HEENT: Normal NECK: No JVD; No carotid bruits LYMPHATICS: No lymphadenopathy CARDIAC: RRR, no murmurs, rubs, gallops RESPIRATORY:  Clear to auscultation without rales, wheezing or rhonchi  ABDOMEN: Soft, non-tender, non-distended MUSCULOSKELETAL:  No edema; No deformity  SKIN: Warm and dry NEUROLOGIC:  Alert and oriented x 3 PSYCHIATRIC:  Normal affect   ASSESSMENT:    1. Heart beat  abnormality   2. PVC (premature ventricular contraction)    PLAN:    In order of problems listed above:  Patient with history of abnormal heartbeat.  2-week cardiac monitor did not show any significant arrhythmias.  Rare ventricular ectopies.  Patient symptoms have improved since the last visit.  Patient was reassured as to no significant arrhythmias noted on cardiac monitor.  She may be sensitive to rare PVCs according.  With improvement in symptoms, will defer starting any medication for now.  If symptoms return or get worse, we  may consider starting a low-dose beta-blocker to see if that helps.  Follow-up as needed   This note was generated in part or whole with voice recognition software. Voice recognition is usually quite accurate but there are transcription errors that can and very often do occur. I apologize for any typographical errors that were not detected and corrected.  Medication Adjustments/Labs and Tests Ordered: Current medicines are reviewed at length with the patient today.  Concerns regarding medicines are outlined above.  Orders Placed This Encounter  Procedures  . EKG 12-Lead   No orders of the defined types were placed in this encounter.   Patient Instructions  Medication Instructions:  Your physician recommends that you continue on your current medications as directed. Please refer to the Current Medication list given to you today.  *If you need a refill on your cardiac medications before your next appointment, please call your pharmacy*   Lab Work: None If you have labs (blood work) drawn today and your tests are completely normal, you will receive your results only by: Marland Kitchen MyChart Message (if you have MyChart) OR . A paper copy in the mail If you have any lab test that is abnormal or we need to change your treatment, we will call you to review the results.   Testing/Procedures: None   Follow-Up: At [facility], you and your health needs are our  priority.  As part of our continuing mission to provide you with exceptional heart care, we have created designated Provider Care Teams.  These Care Teams include your primary Cardiologist (physician) and Advanced Practice Providers (APPs -  Physician Assistants and Nurse Practitioners) who all work together to provide you with the care you need, when you need it.  We recommend signing up for the patient portal called "MyChart".  Sign up information is provided on this After Visit Summary.  MyChart is used to connect with patients for Virtual Visits (Telemedicine).  Patients are able to view lab/test results, encounter notes, upcoming appointments, etc.  Non-urgent messages can be sent to your provider as well.   To learn more about what you can do with MyChart, go to [email or web address].    Your next appointment:   As needed  The format for your next appointment:   In Person  Provider:   Kate Sable, MD   Other Instructions      Signed, Kate Sable, MD  10/31/2019 12:58 PM    [place]

## 2019-10-31 NOTE — Patient Instructions (Signed)

## 2019-11-07 ENCOUNTER — Encounter: Payer: Self-pay | Admitting: Primary Care

## 2019-11-07 ENCOUNTER — Ambulatory Visit: Payer: 59 | Admitting: Primary Care

## 2019-11-07 ENCOUNTER — Other Ambulatory Visit: Payer: Self-pay

## 2019-11-07 ENCOUNTER — Ambulatory Visit (INDEPENDENT_AMBULATORY_CARE_PROVIDER_SITE_OTHER)
Admission: RE | Admit: 2019-11-07 | Discharge: 2019-11-07 | Disposition: A | Discharge status: Home / Self Care | Payer: 59 | Source: Ambulatory Visit | Attending: Primary Care | Admitting: Primary Care

## 2019-11-07 DIAGNOSIS — G8929 Other chronic pain: Secondary | ICD-10-CM

## 2019-11-07 DIAGNOSIS — M25551 Pain in right hip: Secondary | ICD-10-CM | POA: Diagnosis not present

## 2019-11-07 LAB — POCT URINE PREGNANCY: Preg Test, Ur: NEGATIVE

## 2019-11-07 IMAGING — DX DG HIP (WITH OR WITHOUT PELVIS) 2-3V*R*
3 series · 3 of 3 positions shown · non-contrast
Comparison: None.

CLINICAL DATA: Chronic right hip pain, no trauma

EXAM:
DG HIP (WITH OR WITHOUT PELVIS) 2-3V RIGHT

[pelvis ap]
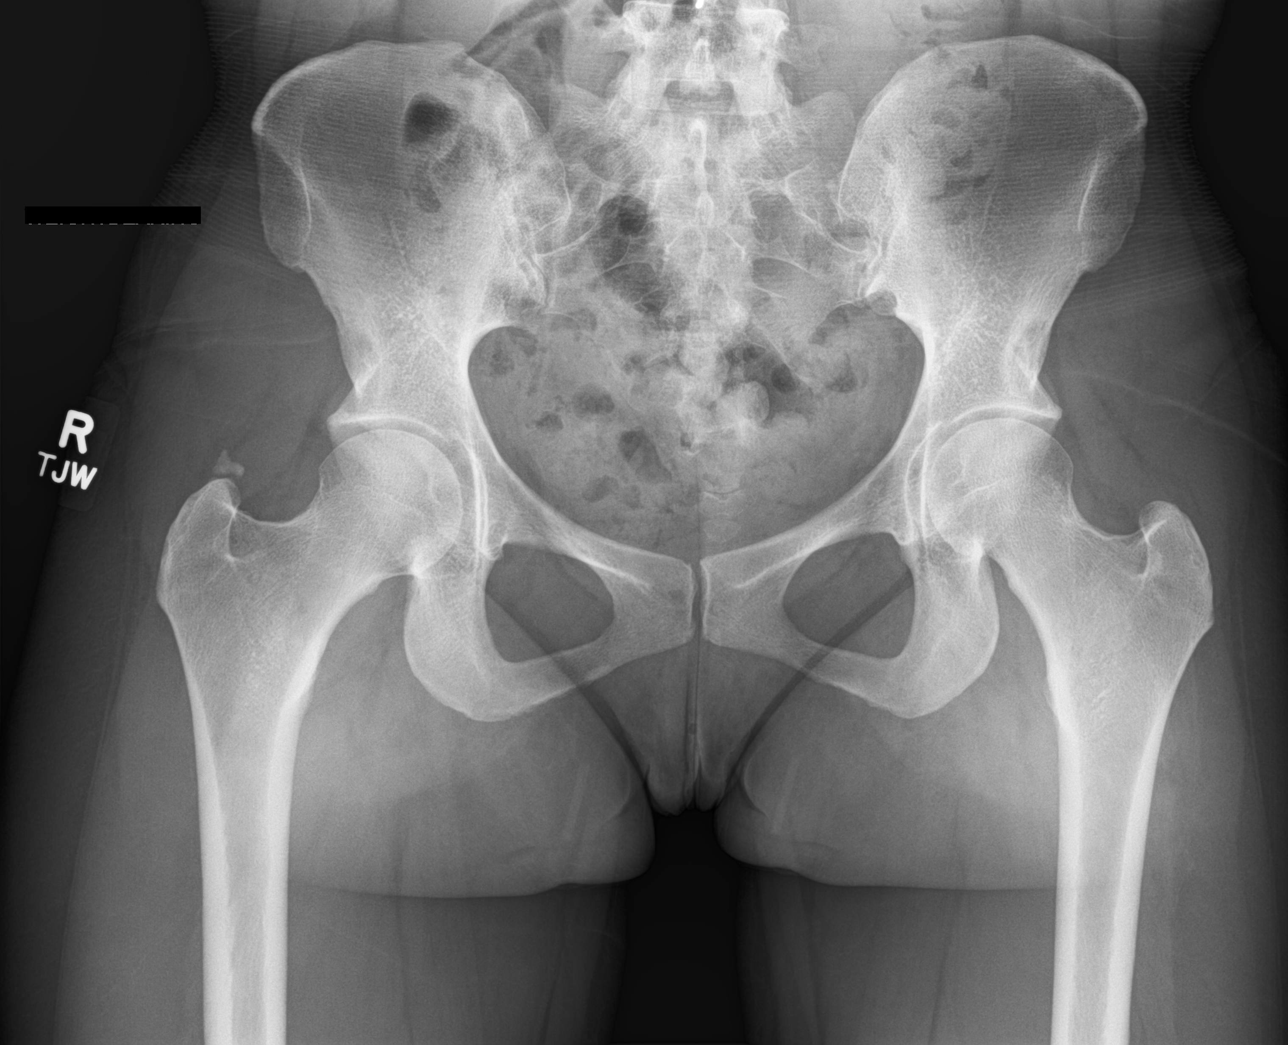

[hip ap]
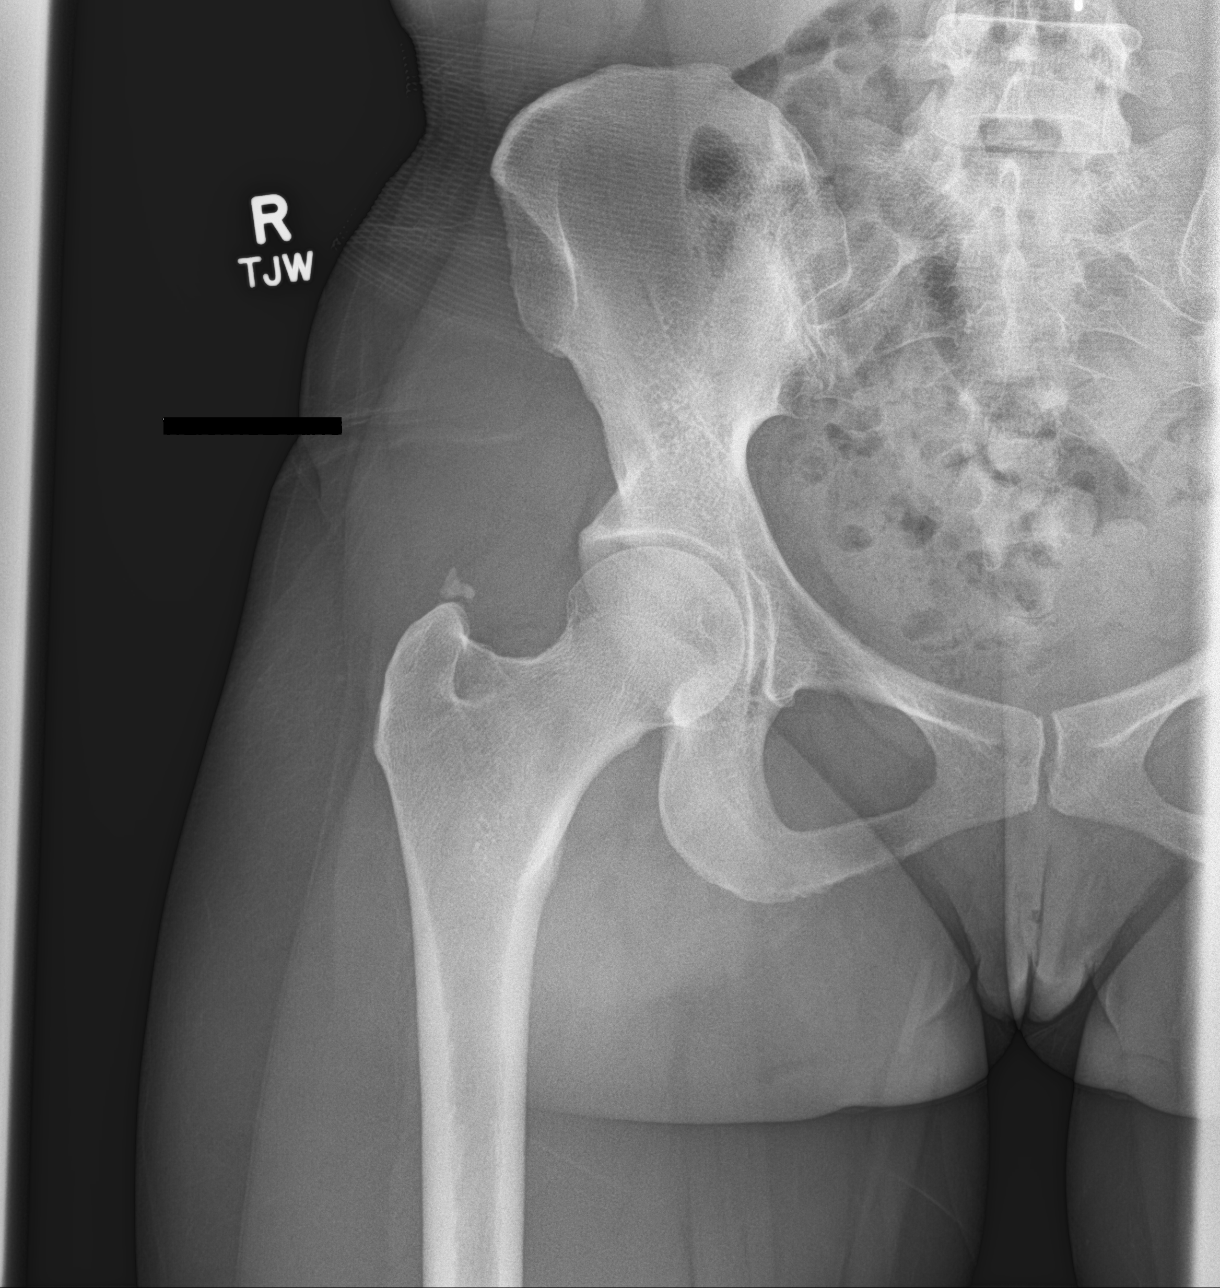

[hip lat]
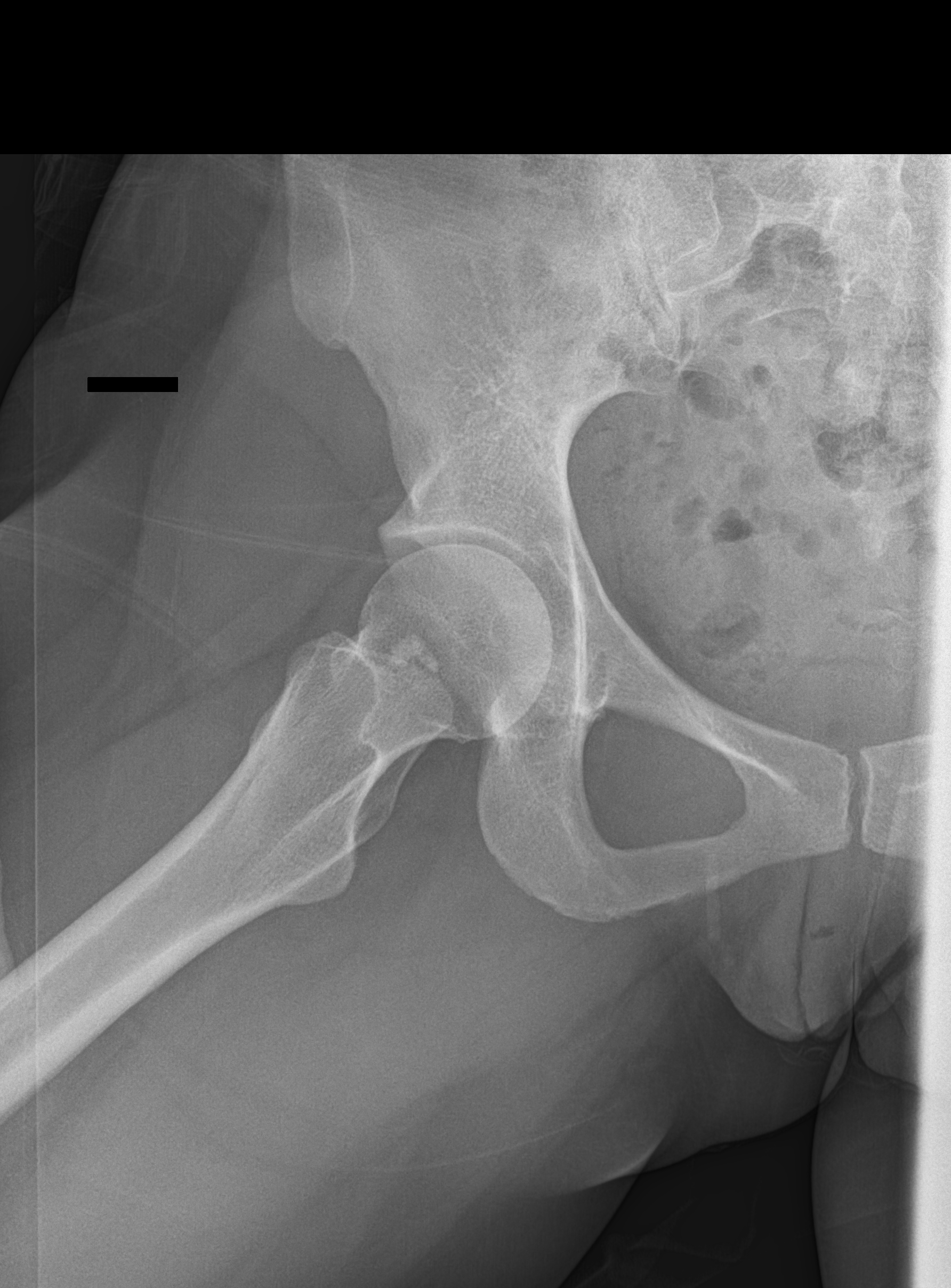

[3 of 3 positions shown; findings below may reference images not displayed]

FINDINGS: There is no evidence of hip fracture or dislocation. Heterotopic
ossification or posttraumatic ossicle adjacent to the right greater
trochanter. There is no evidence of arthropathy or other focal bone
abnormality.
IMPRESSION: 1. No fracture or dislocation of the right hip. The femoroacetabular
joint space is well preserved.

2. Heterotopic ossification or posttraumatic ossicle adjacent to the
right greater trochanter. MRI may be used to further evaluate
integrity of the adjacent ligamentous and tendinous structures,
which may be a cause of pain.

## 2019-11-07 NOTE — Assessment & Plan Note (Signed)
Chronic to right hip, more bothersome over the last several months.  Given chronic symptoms we will proceed with plain film evaluation today. If unremarkable then send to PT, if not then send to orthopedics. She agrees.  Offered pain medication for which she kindly declines.

## 2019-11-07 NOTE — Addendum Note (Signed)
Addended by: Pleas Koch on: 11/07/2019 10:44 AM   Modules accepted: Orders

## 2019-11-07 NOTE — Progress Notes (Signed)
Subjective:    Patient ID: Debbie Bowen, female    DOB: April 08, 1981, 39 y.o.   MRN: [id number]  HPI  This visit occurred during the SARS-CoV-2 public health emergency.  Safety protocols were in place, including screening questions prior to the visit, additional usage of staff PPE, and extensive cleaning of exam room while observing appropriate contact time as indicated for disinfecting solutions.   Debbie Bowen is a 39 year old female with a history of chronic bilateral low back pain who presents today with a chief complaint of hip pain.   Her pain is located to the right hip which began around late 2018. She was evaluated for PT and a chiropractor at the time with improvement without resolve, and manageable until recently. Over the last several months she's noticed an increase in pain, especially when she started exercising.  Activities such as walking, squatting, increased movement make the pain worse. Her pain is constant and dull for the most part. She denies numbness/tingling, weakness. She is not taking anything OTC for her symptoms. She has a family history of osteoarthritis in her father who has had both hips replaced.   Review of Systems  Musculoskeletal: Positive for arthralgias.  Neurological: Negative for weakness and numbness.       Past Medical History:  Diagnosis Date  . ADD (attention deficit disorder)    No medications  . BV (bacterial vaginosis)    recurrent  . Eczema   . GERD (gastroesophageal reflux disease)   . Insomnia   . Orthodontics    invisalign  . Scoliosis    no issues  . Urticaria   . UTI (lower urinary tract infection)    recurrent  . Wears contact lenses      Social History   Socioeconomic History  . Marital status: Single    Spouse name: Not on file  . Number of children: Not on file  . Years of education: 68  . Highest education level: Not on file  Occupational History  . Occupation: Sales promotion account executive    Comment: [employer]  Tobacco Use  . Smoking status: Never Smoker  . Smokeless tobacco: Never Used  Substance and Sexual Activity  . Alcohol use: Yes    Alcohol/week: 2.0 standard drinks    Types: 2 Glasses of wine per week    Comment: occasionally  . Drug use: No  . Sexual activity: Yes    Partners: Male    Birth control/protection: None  Other Topics Concern  . Not on file  Social History Narrative   Single.    No children.   Self Employed.   Enjoys projects around her house.    Social Determinants of Health   Financial Resource Strain:   . Difficulty of Paying Living Expenses: Not on file  Food Insecurity:   . Worried About Charity fundraiser in the Last Year: Not on file  . Ran Out of Food in the Last Year: Not on file  Transportation Needs:   . Lack of Transportation (Medical): Not on file  . Lack of Transportation (Non-Medical): Not on file  Physical Activity:   . Days of Exercise per Week: Not on file  . Minutes of Exercise per Session: Not on file  Stress:   . Feeling of Stress : Not on file  Social Connections:   . Frequency of Communication with Friends and Family: Not on file  . Frequency of Social Gatherings with Friends and Family: Not on file  .  Attends Religious Services: Not on file  . Active Member of Clubs or Organizations: Not on file  . Attends Archivist Meetings: Not on file  . Marital Status: Not on file  Intimate Partner Violence:   . Fear of Current or Ex-Partner: Not on file  . Emotionally Abused: Not on file  . Physically Abused: Not on file  . Sexually Abused: Not on file    Past Surgical History:  Procedure Laterality Date  . CERVIX LESION DESTRUCTION    . DILATION AND CURETTAGE OF UTERUS     for TAB  . ESOPHAGOGASTRODUODENOSCOPY (EGD) WITH PROPOFOL N/A 10/04/2015   Procedure: ESOPHAGOGASTRODUODENOSCOPY (EGD) WITH PROPOFOL;  Surgeon: Lucilla Lame, MD;  Location: [facility];  Service: Endoscopy;  Laterality: N/A;     Family History  Problem Relation Age of Onset  . Diabetes Mother   . Breast cancer Mother 54       Breast one side BRCA negative. Triple negative  . Juvenile idiopathic arthritis Father   . Eczema Father   . Diabetes Maternal Aunt   . Diabetes Paternal Aunt   . Breast cancer Paternal Grandmother   . Diabetes Paternal Grandmother   . Asthma Sister   . Asthma Brother   . Diabetes Maternal Grandmother   . Prostate cancer Maternal Grandfather   . Prostate cancer Paternal Grandfather   . Breast cancer Paternal Aunt 15  . Breast cancer Paternal Aunt 25  . Urticaria Neg Hx   . Allergic rhinitis Neg Hx   . Angioedema Neg Hx   . Immunodeficiency Neg Hx     No Known Allergies  Current Outpatient Medications on File Prior to Visit  Medication Sig Dispense Refill  . ibuprofen (ADVIL) 200 MG tablet Take 200 mg by mouth as needed.    Marland Kitchen letrozole (FEMARA) 2.5 MG tablet Take 2.5 mg by mouth as directed. Take for 5 days then off 1 month and repeat as directed    . nitrofurantoin (MACRODANTIN) 50 MG capsule Take by mouth as needed.      No current facility-administered medications on file prior to visit.    BP 114/68   Pulse 74   Temp (!) 97 F (36.1 C) (Temporal)   Ht 5' 7"  (1.702 m)   Wt 140 lb 12 oz (63.8 kg)   LMP 10/25/2019   SpO2 98%   BMI 22.04 kg/m    Objective:   Physical Exam  Constitutional: She appears well-nourished.  Cardiovascular: Normal rate and regular rhythm.  Musculoskeletal:     Cervical back: Neck supple.     Right hip: Decreased range of motion. Normal strength.     Comments: Slight decrease in ROM with internal rotation and flexion in supine position. Strength to bilateral lower extremities 5/5.  Skin: Skin is warm and dry.           Assessment & Plan:

## 2019-11-07 NOTE — Patient Instructions (Signed)
Complete xray(s) prior to leaving today. I will notify you of your results once received.  It was a pleasure to see you today!  

## 2019-11-07 NOTE — Addendum Note (Signed)
Addended by: Ellamae Sia on: 11/07/2019 10:53 AM   Modules accepted: Orders

## 2019-11-19 ENCOUNTER — Ambulatory Visit (HOSPITAL_COMMUNITY)
Admission: RE | Admit: 2019-11-19 | Discharge: 2019-11-19 | Disposition: A | Discharge status: Home / Self Care | Payer: 59 | Source: Ambulatory Visit | Attending: Primary Care | Admitting: Primary Care

## 2019-11-19 ENCOUNTER — Other Ambulatory Visit: Payer: Self-pay

## 2019-11-19 ENCOUNTER — Other Ambulatory Visit: Payer: Self-pay | Admitting: Primary Care

## 2019-11-19 DIAGNOSIS — G8929 Other chronic pain: Secondary | ICD-10-CM | POA: Diagnosis present

## 2019-11-19 DIAGNOSIS — M25551 Pain in right hip: Secondary | ICD-10-CM | POA: Diagnosis present

## 2019-11-19 IMAGING — MR MR HIP*R* W/O CM
6 series · 40 of 40 positions shown · non-contrast
Comparison: None.

CLINICAL DATA: Right hip pain.

EXAM:
MR OF THE RIGHT HIP WITHOUT CONTRAST
TECHNIQUE: Multiplanar, multisequence MR imaging was performed. No intravenous
contrast was administered.

[Series 8: T1 · coronal · right · 4.0mm · 1.17mm/px · 9 of 40 slices shown]
[im 1/40]
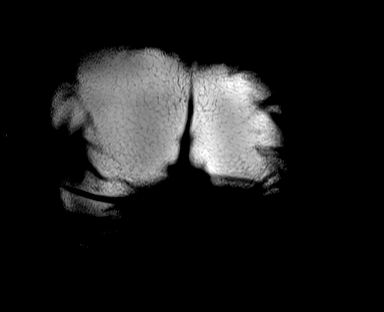
[im 5/40]
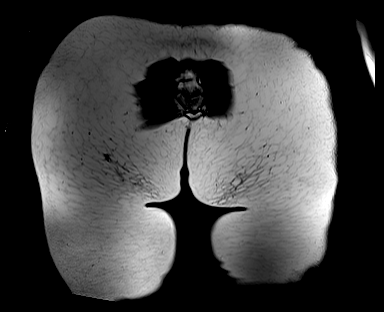
[im 10/40]
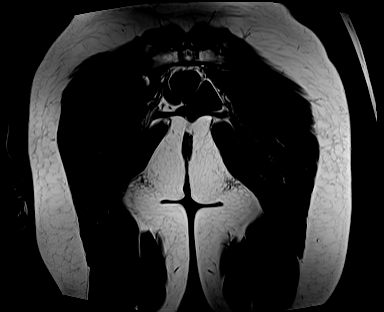
[im 15/40]
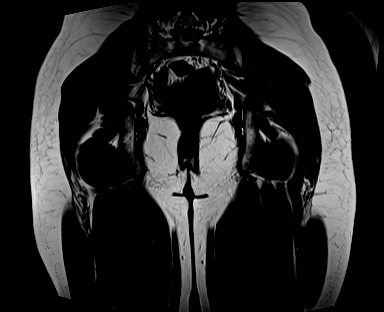
[im 20/40]
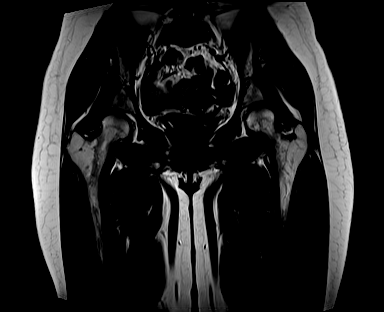
[im 25/40]
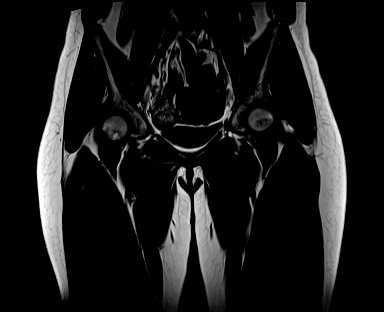
[im 30/40]
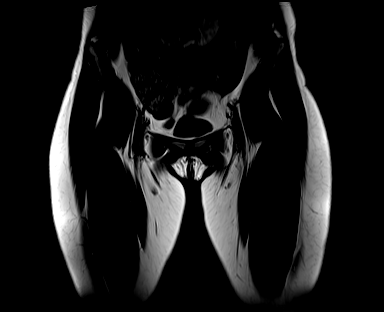
[im 35/40]
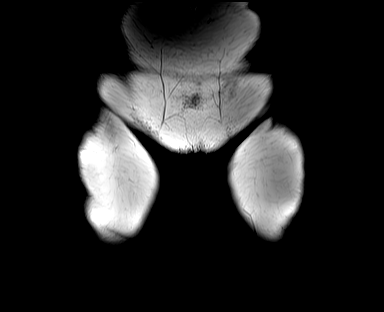
[im 40/40]
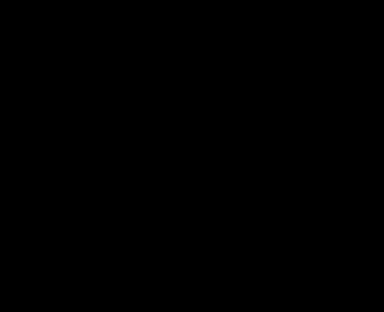

[Series 9: T2 fat-sat · coronal · right · 4.0mm · 1.00mm/px · 8 of 40 slices shown (1 of 2)]
[im 1/40]
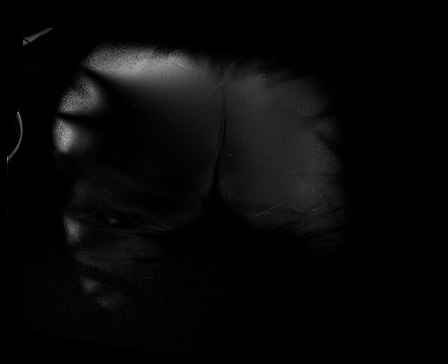
[im 6/40]
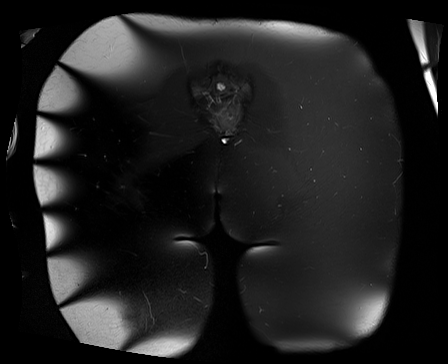
[im 12/40]
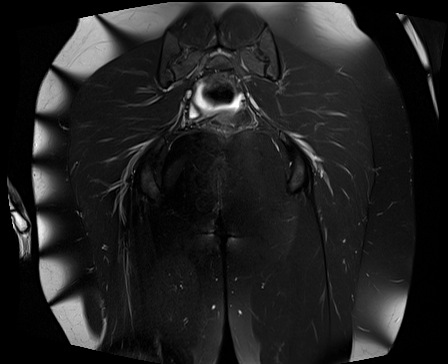
[im 17/40]
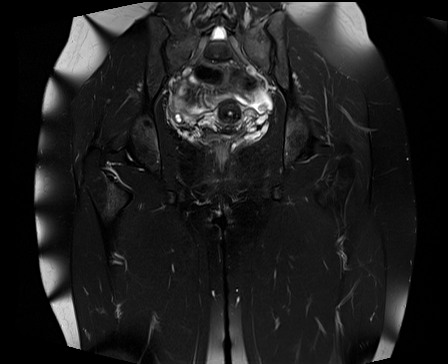
[im 23/40]
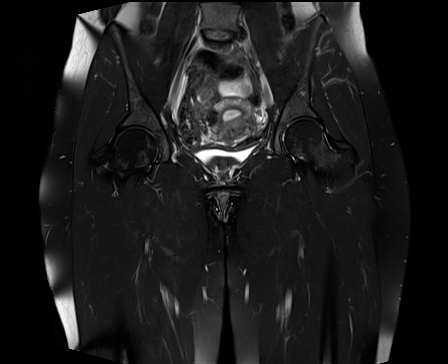
[im 28/40]
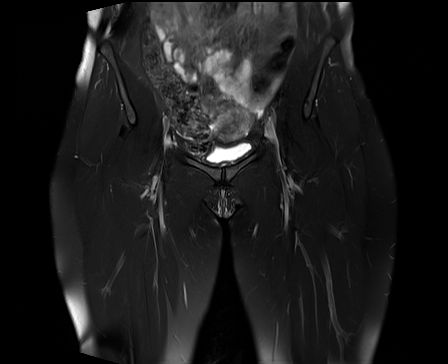
[im 34/40]
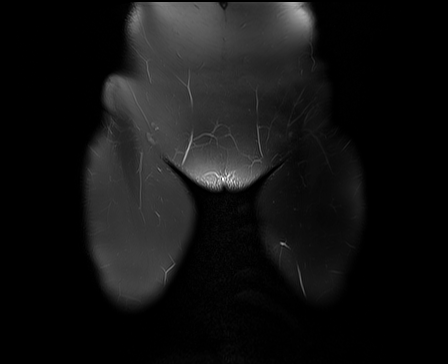
[im 40/40]
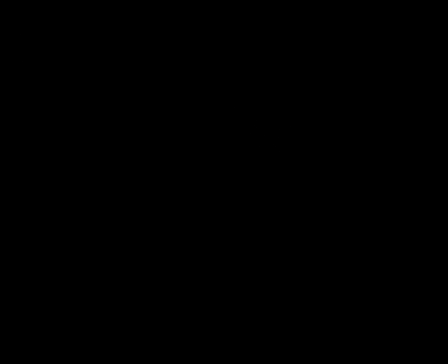

[Series 10: T2 fat-sat · axial · right · 4.0mm · 0.35mm/px · z∈[-150,+20]mm · 7 of 35 slices shown (2 of 2)]
[im 1/35]
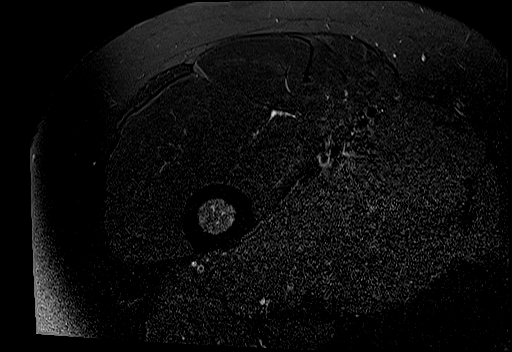
[im 6/35]
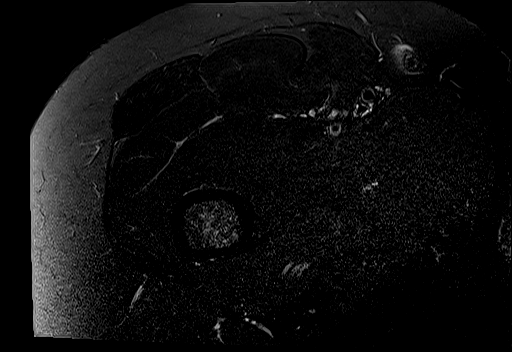
[im 12/35]
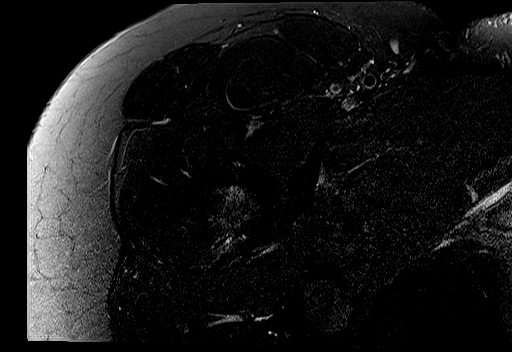
[im 18/35]
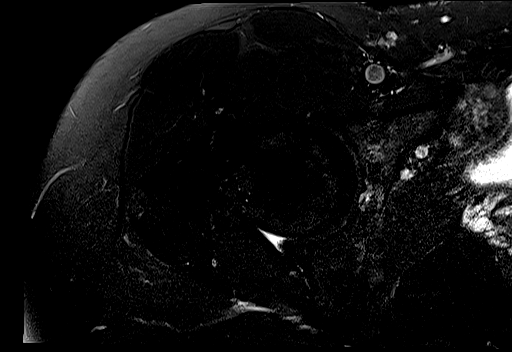
[im 23/35]
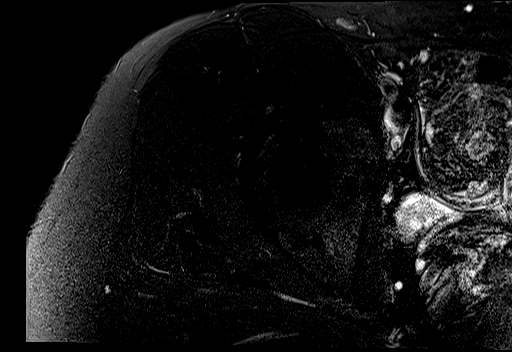
[im 29/35]
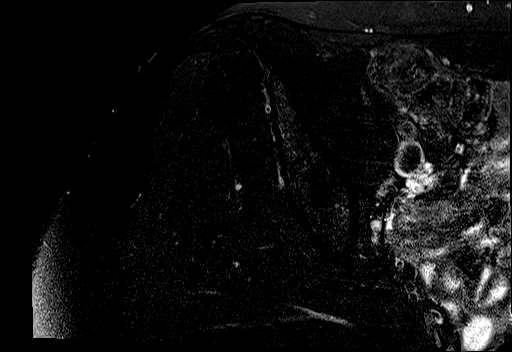
[im 35/35]
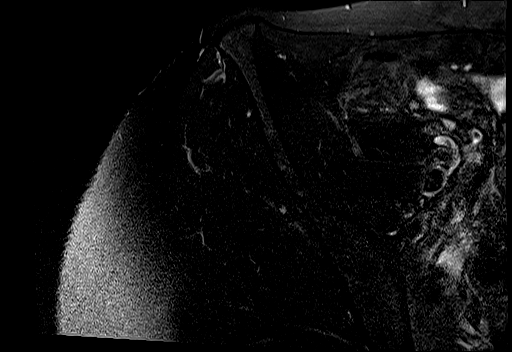

[Series 11: PD fat-sat · sagittal · right · 4.0mm · 0.56mm/px · 5 of 24 slices shown (1 of 2)]
[im 1/24]
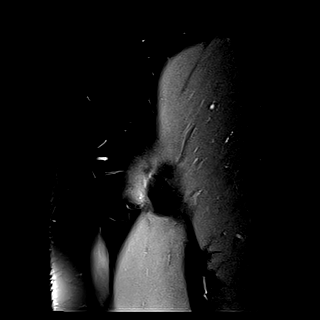
[im 6/24]
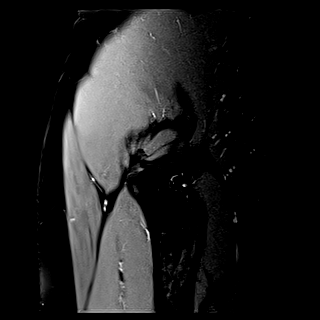
[im 12/24]
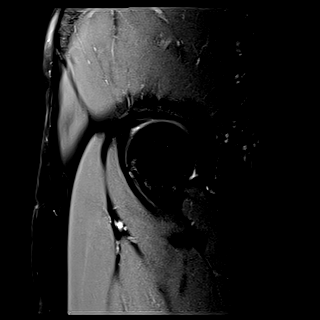
[im 18/24]
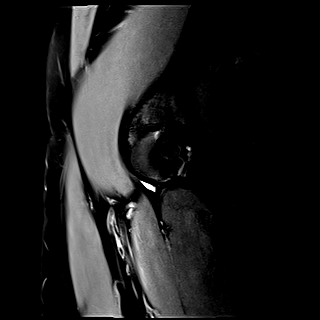
[im 24/24]
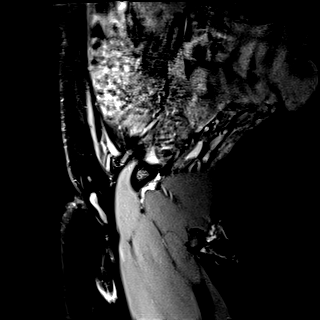

[Series 12: PD fat-sat · coronal · right · 3.0mm · 0.70mm/px · 4 of 20 slices shown (2 of 2)]
[im 1/20]
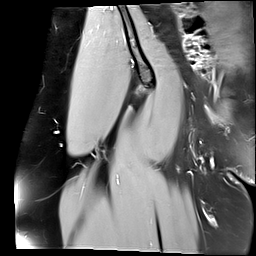
[im 7/20]
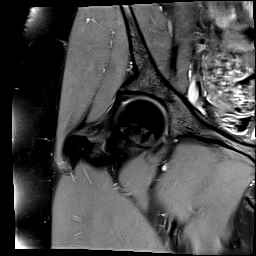
[im 13/20]
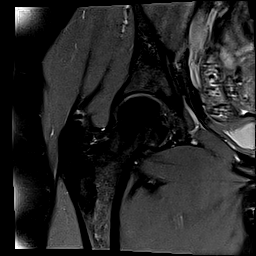
[im 20/20]
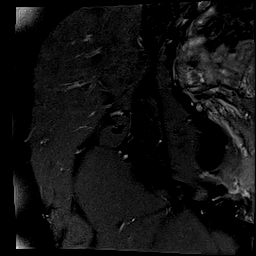

[Series 13: T1 fat-sat · axial · non-contrast · right · 4.0mm · 0.87mm/px · z∈[-150,+20]mm · 7 of 35 slices shown]
[im 1/35]
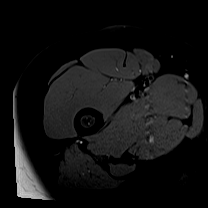
[im 6/35]
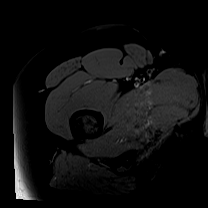
[im 12/35]
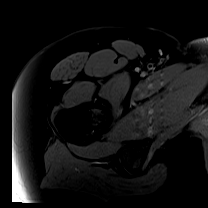
[im 18/35]
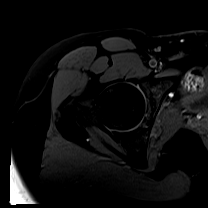
[im 23/35]
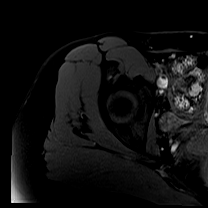
[im 29/35]
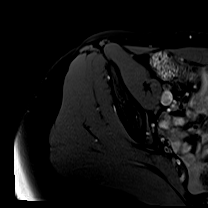
[im 35/35]
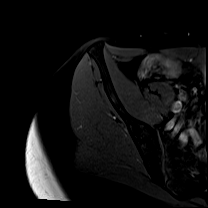

[40 of 40 positions shown; findings below may reference images not displayed]

FINDINGS: Bones: No hip fracture, dislocation or avascular necrosis. No
periosteal reaction or bone destruction. No aggressive osseous
lesion.

Normal sacrum and sacroiliac joints. No SI joint widening or erosive
changes.

Articular cartilage and labrum

Articular cartilage:  No chondral defect.

Labrum: Limited evaluation of the labrum secondary lack of
intra-articular fluid. Small right anterior labral tear.

Joint or bursal effusion

Joint effusion:  No hip joint effusion.  No SI joint effusion.

Bursae: No bursa formation.

Muscles and tendons

Flexors: Normal.

Extensors: Normal.

Abductors: Normal.

Adductors: Normal.

Gluteals: Small area of calcification adjacent to the posterior
aspect of right greater trochanter at the gluteus minimus insertion
without associated tendinosis. Gluteal tendons and muscles are
otherwise normal.

Hamstrings: Normal.

Other findings

Miscellaneous: No pelvic free fluid. No fluid collection or
hematoma. No inguinal lymphadenopathy. No inguinal hernia.
IMPRESSION: 1. No hip fracture, dislocation or avascular necrosis.
2. Small right anterior labral tear.
3. Small area of calcification adjacent to the posterior aspect of
right greater trochanter at the gluteus minimus insertion without
associated tendinosis. Gluteal tendons and muscles are otherwise
normal.

## 2019-11-19 MED ORDER — GADOBUTROL 1 MMOL/ML IV SOLN: 6.3000 mL | Freq: Once | INTRAVENOUS | Status: DC | PRN

## 2019-11-21 DIAGNOSIS — G8929 Other chronic pain: Secondary | ICD-10-CM

## 2019-12-06 ENCOUNTER — Ambulatory Visit: Payer: 59 | Attending: Primary Care | Admitting: Physical Therapy

## 2019-12-06 ENCOUNTER — Other Ambulatory Visit: Payer: Self-pay

## 2019-12-06 ENCOUNTER — Encounter: Payer: Self-pay | Admitting: Physical Therapy

## 2019-12-06 DIAGNOSIS — M25651 Stiffness of right hip, not elsewhere classified: Secondary | ICD-10-CM | POA: Diagnosis present

## 2019-12-06 DIAGNOSIS — M545 Low back pain: Secondary | ICD-10-CM | POA: Diagnosis present

## 2019-12-06 DIAGNOSIS — M25552 Pain in left hip: Secondary | ICD-10-CM | POA: Insufficient documentation

## 2019-12-06 DIAGNOSIS — M62830 Muscle spasm of back: Secondary | ICD-10-CM | POA: Insufficient documentation

## 2019-12-06 DIAGNOSIS — M25551 Pain in right hip: Secondary | ICD-10-CM | POA: Insufficient documentation

## 2019-12-06 DIAGNOSIS — R293 Abnormal posture: Secondary | ICD-10-CM | POA: Diagnosis present

## 2019-12-06 DIAGNOSIS — G8929 Other chronic pain: Secondary | ICD-10-CM | POA: Diagnosis present

## 2019-12-06 NOTE — Therapy (Signed)
[facility] [address], [place], [address] Phone: [phone]   Fax:  [phone]  Physical Therapy Evaluation   Patient Details  Name: Debbie Bowen MRN: [id number] Date of Birth: 10-11-1980 No data recorded  Encounter Date: 12/06/2019  PT End of Session - 12/06/19 1532    Visit Number  1    Number of Visits  17    Date for PT Re-Evaluation  01/20/20    PT Start Time  0330    PT Stop Time  0430    PT Time Calculation (min)  60 min    Activity Tolerance  Patient tolerated treatment well    Behavior During Therapy  [facility] for tasks assessed/performed       Past Medical History:  Diagnosis Date  . ADD (attention deficit disorder)    No medications  . BV (bacterial vaginosis)    recurrent  . Eczema   . GERD (gastroesophageal reflux disease)   . Insomnia   . Orthodontics    invisalign  . Scoliosis    no issues  . Urticaria   . UTI (lower urinary tract infection)    recurrent  . Wears contact lenses     Past Surgical History:  Procedure Laterality Date  . CERVIX LESION DESTRUCTION    . DILATION AND CURETTAGE OF UTERUS     for TAB  . ESOPHAGOGASTRODUODENOSCOPY (EGD) WITH PROPOFOL N/A 10/04/2015   Procedure: ESOPHAGOGASTRODUODENOSCOPY (EGD) WITH PROPOFOL;  Surgeon: Lucilla Lame, MD;  Location: [facility];  Service: Endoscopy;  Laterality: N/A;    There were no vitals filed for this visit.  Subjective Assessment - 12/06/19 1519    Pertinent History  Pt is a 39 year old female presenting with chronic R hip pain over the past 2 years that she has recently started seeking treatment for. Initially saw PCP and PCP presumed this was LBP with hip radiation, MRI revealed small R hip labrum tear. She reports that her pain began 2 years ago after she was going across monkey bars at her gym and fell off with her LEs extended. Reports she felt a pop and immediate pain and thinks this is her MOI.  Patient reports pain is a fairly consistent dull ache, that can be sharp when she stands from prolonged sitting, or move from a criss-cross sitting position or crossed legs, but quickly dissipates. 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 She enjoys DIY projects at home and reports her hip makes it difficult to bend/stoop, and squat under her house. She also has difficulty with her hip feeling stiff when she is driving her manual car. Worst pain 7/10, best 0/10. Denies tingling/numbness with no radicular symptoms, but does report that occasionally she feels pain at distal ITB.  Pt denies N/V, B&B changes, unexplained weight fluctuation, saddle paresthesia, fever, night sweats, or unrelenting night pain at this time.    How long can you sit comfortably?  77mins    How long can you stand comfortably?  85mins    How long can you walk comfortably?  54mins    Diagnostic tests  MRI "small R hip labrum tear, Small area of calcification adjacent to the posterior aspect ofright greater trochanter at the gluteus minimus insertion without associated tendinosis. Gluteal tendons and muscles are otherwisenormal.    Patient Stated Goals  Relieve pain    Currently in Pain?  Yes  Pain Score  0-No pain    Pain Location  Hip    Pain Orientation  Right;Anterior    Pain Descriptors / Indicators  Dull;Aching    Pain Type  Chronic pain    Pain Radiating Towards  along lateral aspect of leg at ITB distal insertion    Pain Onset  More than a month ago    Pain Frequency  Intermittent    Aggravating Factors   squatting, standing from sit, prolonged sitting, bending/stooping    Pain Relieving Factors  nothing    Effect of Pain on Daily Activities  difficulty driving manual car and completing projects at home             OBJECTIVE  Mental Status Patient is oriented to person, place and time.  Recent memory is intact.  Remote memory is intact.  Attention span and  concentration are intact.  Expressive speech is intact.  Patient's fund of knowledge is within normal limits for educational level.  SENSATION: Grossly intact to light touch bilateral LEs as determined by testing dermatomes L2-S2 Proprioception and hot/cold testing deferred on this date   MUSCULOSKELETAL: Tremor: None Bulk: Normal Tone: Normal No visible step-off along spinal column  Posture Lumbar lordosis: WNL Iliac crest height: equal bilaterally Lumbar lateral shift: negative Lower crossed syndrome (tight hip flexors and erector spinae; weak gluts and abs): negative  Gait Narrow BOS bilat knee valgus, slight R trendelenberg.    Palpation Tension at glute med, latent trigger points. Patient reports pain sensation comes on at iliopsoas with palpation to this area, though she denies pain today.    Strength (out of 5) R/L 5/5 Hip flexion 4+/5 Hip ER 5/5 Hip IR 4/5 Hip abduction 5/4+ Hip adduction 5/4 Hip extension 5/5 Knee extension 5/5 Knee flexion 5/5 Ankle dorsiflexion 5/5 Ankle plantarflexion 5 Trunk flexion 5 Trunk extension 5/5 Trunk rotation  *Indicates pain   AROM (degrees) All lumbar and bilat hip motions WNL with some discomfort with R active IR *Indicates pain   PROM (degrees) PROM = AROM R/L (all movements include overpressure unless otherwise stated) Pain with passive IR, though motion is increased compared to active  Repeated Movements No centralization or peripheralization of symptoms with repeated lumbar extension or flexion.    Muscle Length Hamstrings: Negative bilat Ely: Negative bilat Thomas:  Negative bilat Ober: Negative bilat   Passive Accessory Intervertebral Motion (PAIVM) Pt denies reproduction of back pain with CPA L1-L5 and UPA bilaterally L1-L5. Generally hypomobile throughout  Passive Physiological Intervertebral Motion (PPIVM) Normal flexion and extension with PPIVM testing   SPECIAL TESTS Lumbar Radiculopathy and  Discogenic: Centralization and Peripheralization (SN 92, -LR 0.12): Negative bilat Slump (SN 83, -LR 0.32): Negative bilat SLR (SN 92, -LR 0.29): Negative Crossed SLR (SP 90):Negative  Facet Joint: Extension-Rotation (SN 100, -LR 0.0): Negative bilat  Hip: FABER (SN 81): R:Positve L: Negative FADIR (SN 94): R:Positve L: Negative Hip scour (SN 50): Negative bilat  SIJ:  Thigh Thrust (SN 88, -LR 0.18) : Negative bilat  Piriformis Syndrome: FAIR Test (SN 88, SP 83): R: Positive L: Negative  Functional Tasks Squatting: bilat knee valgus  Lateral Step-Down Test: R: Unable to complete on RLE with knee valgus and hip drop bilat   Ther-Ex Education on hip musculature OIA, labrum anatomy, and length tension relationships of the hip with visual aid.Patient able to demonstrate a set of the following therex with min cuing for correction, and verbalize understanding of education provided on rep/set range, frequency, and intensity  Access Code: [email or web address]: [email or web address]: 04/06/2021Prepared by: Vikki Ports MillerExercises  Seated Figure 4 Piriformis Stretch - 2-3 x daily - 7 x weekly - 30sec hold  Seated Piriformis Stretch - 2-3 x daily - 7 x weekly - 30sec hold  Supine Butterfly Groin Stretch - 2-3 x daily - 7 x weekly - 30sec hold                            PT Education - 12/06/19 1532    Education Details  Patient was educated on diagnosis, anatomy and pathology involved, prognosis, role of PT, and was given an HEP, demonstrating exercise with proper form following verbal and tactile cues, and was given a paper hand out to continue exercise at home. Pt was educated on and agreed to plan of care.    Person(s) Educated  Patient    Methods  Explanation;Demonstration;Tactile cues;Verbal cues;Handout    Comprehension  Verbalized understanding;Returned demonstration;Verbal cues required;Tactile cues required;Need further instruction       PT Short  Term Goals - 12/07/19 [id number]      PT SHORT TERM GOAL #1   Title  Pt will be independent with HEP in order to improve strength and decrease back pain in order to improve pain-free function at home and work.    Time  4    Period  Weeks    Status  New        PT Long Term Goals - 12/07/19 [id number]      PT LONG TERM GOAL #1   Title  Patient will increase FOTO score to 89 to demonstrate predicted increase in functional mobility to complete ADLs    Baseline  12/06/19 85    Time  6    Period  Weeks    Status  New      PT LONG TERM GOAL #2   Title  Pt will decrease worst hip pain as reported on NPRS by at least 2 points in order to demonstrate clinically significant reduction in back pain.    Baseline  12/06/19 7/10 pain at worst with squatting/transferring    Time  6    Period  Weeks    Status  New      PT LONG TERM GOAL #3   Title  Patient will demonstrate 5/5 hip ext, adduction, abduction, and ER MMT to order to demonstrate symmetry to LLE in order to complete heavy household tasks    Baseline  12/06/19 ext 4; add 4+; abd 4; ER 4+    Time  6    Period  Weeks    Status  New            Plan - 12/07/19 1345    Clinical Impression Statement  Pt is a 39 year old female presenting with chronic right hip pain (>2years) following a fall from monkey bars. Signs and symptoms consistent with labral pathology. Impairments in decreased glute strength, decreased hip mobility, R hip pain, and abnormal gait. Activity limitations in squatting, sit <> stand transfer, bending/stooping, driving, and lifting; inhibiting full participation in ADLs and her job. Patinet will beenfit from skilled PT to address aforementioned impairments to return to PLOF.    Personal Factors and Comorbidities  Sex;Fitness;Past/Current Experience;Time since onset of injury/illness/exacerbation    Examination-Activity Limitations  Squat;Lift;Bend;Sit    Examination-Participation Restrictions  Cleaning;Community Activity;Yard Work     Stability/Clinical Decision Making  Evolving/Moderate complexity    Clinical Decision Making  Moderate  Rehab Potential  Good    PT Frequency  2x / week    PT Duration  8 weeks    PT Next Visit Plan  HEP review, gait training, hip stabilization/strengthening    PT Home Exercise Plan  see eval 4/6    Consulted and Agree with Plan of Care  Patient       Patient will benefit from skilled therapeutic intervention in order to improve the following deficits and impairments:  Postural dysfunction, Decreased mobility, Abnormal gait, Increased fascial restricitons, Improper body mechanics, Pain, Decreased activity tolerance, Decreased strength, Decreased range of motion, Impaired flexibility, Difficulty walking  Visit Diagnosis: Pain in right hip  Stiffness of right hip, not elsewhere classified     Problem List Patient Active Problem List   Diagnosis Date Noted  . Chronic hip pain 11/07/2019  . Facial pain 09/22/2019  . GERD (gastroesophageal reflux disease) 09/14/2019  . Palpitations 09/14/2019  . Family history of breast cancer gene mutation in first degree relative 04/07/2019  . Dysmenorrhea 01/18/2018  . Chronic bilateral low back pain without sciatica 06/05/2017  . Dermatitis 05/19/2016  . Perennial allergic rhinitis 05/19/2016  . Preventative health care 12/31/2015  . Hyperlipidemia 12/31/2015  . Infertility, female 12/20/2015  . ADHD (attention deficit hyperactivity disorder) 12/06/2015  . Recurrent UTI 12/06/2015   Shelton Silvas PT, DPT Shelton Silvas 12/07/2019, 3:19 PM  [facility] [address], [place], [address] Phone: [phone]   Fax:  [phone]  Name: Debbie Bowen MRN: [id number] Date of Birth: [birth year]

## 2019-12-12 ENCOUNTER — Ambulatory Visit: Payer: 59 | Admitting: Physical Therapy

## 2019-12-14 ENCOUNTER — Ambulatory Visit: Payer: 59 | Admitting: Physical Therapy

## 2019-12-19 ENCOUNTER — Other Ambulatory Visit: Payer: Self-pay

## 2019-12-19 ENCOUNTER — Ambulatory Visit: Payer: 59 | Admitting: Physical Therapy

## 2019-12-19 ENCOUNTER — Encounter: Payer: Self-pay | Admitting: Physical Therapy

## 2019-12-19 DIAGNOSIS — G8929 Other chronic pain: Secondary | ICD-10-CM

## 2019-12-19 DIAGNOSIS — M25551 Pain in right hip: Secondary | ICD-10-CM

## 2019-12-19 DIAGNOSIS — R293 Abnormal posture: Secondary | ICD-10-CM

## 2019-12-19 DIAGNOSIS — M25651 Stiffness of right hip, not elsewhere classified: Secondary | ICD-10-CM

## 2019-12-19 DIAGNOSIS — M25552 Pain in left hip: Secondary | ICD-10-CM

## 2019-12-19 DIAGNOSIS — M62830 Muscle spasm of back: Secondary | ICD-10-CM

## 2019-12-19 NOTE — Therapy (Signed)
[facility] [address], [place], [address] Phone: [phone]   Fax:  [phone]  Physical Therapy Treatment  Patient Details  Name: Debbie Bowen MRN: [id number] Date of Birth: [birth year] No data recorded  Encounter Date: 12/19/2019  PT End of Session - 12/19/19 0910    Visit Number  2    Number of Visits  17    Date for PT Re-Evaluation  01/20/20    PT Start Time  0902    PT Stop Time  0940    PT Time Calculation (min)  38 min    Activity Tolerance  Patient tolerated treatment well    Behavior During Therapy  [facility] for tasks assessed/performed       Past Medical History:  Diagnosis Date  . ADD (attention deficit disorder)    No medications  . BV (bacterial vaginosis)    recurrent  . Eczema   . GERD (gastroesophageal reflux disease)   . Insomnia   . Orthodontics    invisalign  . Scoliosis    no issues  . Urticaria   . UTI (lower urinary tract infection)    recurrent  . Wears contact lenses     Past Surgical History:  Procedure Laterality Date  . CERVIX LESION DESTRUCTION    . DILATION AND CURETTAGE OF UTERUS     for TAB  . ESOPHAGOGASTRODUODENOSCOPY (EGD) WITH PROPOFOL N/A 10/04/2015   Procedure: ESOPHAGOGASTRODUODENOSCOPY (EGD) WITH PROPOFOL;  Surgeon: Lucilla Lame, MD;  Location: [facility];  Service: Endoscopy;  Laterality: N/A;    There were no vitals filed for this visit.  Subjective Assessment - 12/19/19 0902    Subjective  Reports Saturday she did a lot of yard work and that her pain would get worse when she was squatting down, and stayed somewhat elevated that evening, reporting this was 5/10 pain. Reports 2/10 pain today.    Pertinent History  Pt is a 39 year old female presenting with chronic R hip pain over the past 2 years that she has recently started seeking treatment for. Initially saw PCP and PCP presumed this was LBP with hip radiation, MRI revealed small  R hip labrum tear. She reports that her pain began 2 years ago after she was going across monkey bars at her gym and fell off with her LEs extended. Reports she felt a pop and immediate pain and thinks this is her MOI. Patient reports pain is a fairly consistent dull ache, that can be sharp when she stands from prolonged sitting, or move from a criss-cross sitting position or crossed legs, but quickly dissipates. 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 She enjoys DIY projects at home and reports her hip makes it difficult to bend/stoop, and squat under her house. She also has difficulty with her hip feeling stiff when she is driving her manual car. Worst pain 7/10, best 0/10. Denies tingling/numbness with no radicular symptoms, but does report that occasionally she feels pain at distal ITB.  Pt denies N/V, B&B changes, unexplained weight fluctuation, saddle paresthesia, fever, night sweats, or unrelenting night pain at this time.    How long can you sit comfortably?  34mins    How long can you stand comfortably?  76mins    How long can you walk comfortably?  34mins    Diagnostic tests  MRI "small R hip labrum tear, Small area of calcification  adjacent to the posterior aspect ofright greater trochanter at the gluteus minimus insertion without associated tendinosis. Gluteal tendons and muscles are otherwisenormal.    Patient Stated Goals  Relieve pain    Pain Onset  More than a month ago       Ther-Ex Nustep seat setting 7 L4 47mins for gentle strengthening Hooklying ER GTB 3x 10 with cuing for eccentric control ER bridge with GTB 2x 10 with good carry over of proper technique following cuing Posterior pelvic tilt x15 3sec hold (attempted dead bug theraball squeezes with patient unable to contract core in this position) good carry over following heavy TC in first couple reps of hand under low back; with alt TA marches x10; with deadbug BLE only alt 2x 10   Mini squat to elevated mat table 3x 10  with cuing for core contraction  Hip hinge x10 with demo and uing ofr technique with excellent carry over Straight leg deadlift 10# DB bilat 2x 10 with min cuing for technique, great carry over Single leg deadlift BW 2x 8  With increased cuing needed to prevent rotation on RLE                        PT Education - 12/19/19 0910    Education Details  Therex form/technique    Person(s) Educated  Patient    Methods  Explanation;Demonstration;Verbal cues    Comprehension  Verbalized understanding;Returned demonstration;Verbal cues required       PT Short Term Goals - 12/07/19 0938      PT SHORT TERM GOAL #1   Title  Pt will be independent with HEP in order to improve strength and decrease back pain in order to improve pain-free function at home and work.    Time  4    Period  Weeks    Status  New        PT Long Term Goals - 12/07/19 [id number]      PT LONG TERM GOAL #1   Title  Patient will increase FOTO score to 89 to demonstrate predicted increase in functional mobility to complete ADLs    Baseline  12/06/19 85    Time  6    Period  Weeks    Status  New      PT LONG TERM GOAL #2   Title  Pt will decrease worst hip pain as reported on NPRS by at least 2 points in order to demonstrate clinically significant reduction in back pain.    Baseline  12/06/19 7/10 pain at worst with squatting/transferring    Time  6    Period  Weeks    Status  New      PT LONG TERM GOAL #3   Title  Patient will demonstrate 5/5 hip ext, adduction, abduction, and ER MMT to order to demonstrate symmetry to LLE in order to complete heavy household tasks    Baseline  12/06/19 ext 4; add 4+; abd 4; ER 4+    Time  6    Period  Weeks    Status  New            Plan - 12/19/19 [id number]    Clinical Impression Statement  PT initiated therex for increased postural strengthening/stability with core and hip muscle activation with success. Patient with  diffiuclty activating core initially for neutral lumbar spine, but good carry over of this into weight bearing positions following some demo and cuing. PT will continue progression as  able.    Personal Factors and Comorbidities  Sex;Fitness;Past/Current Experience;Time since onset of injury/illness/exacerbation    Examination-Activity Limitations  Squat;Lift;Bend;Sit    Examination-Participation Restrictions  Cleaning;Community Activity;Yard Work    [employer] officer  Evolving/Moderate complexity    Clinical Decision Making  Moderate    Rehab Potential  Good    PT Frequency  2x / week    PT Duration  8 weeks    PT Treatment/Interventions  ADLs/Self Care Home Management;Electrical Stimulation;Therapeutic activities;Patient/family education;Therapeutic exercise;Cryotherapy;Functional mobility training;Manual techniques;Dry needling;Passive range of motion;Joint Manipulations;Spinal Manipulations;Balance training;Gait training;Moist Heat;Traction;Ultrasound;Stair training;Neuromuscular re-education    PT Next Visit Plan  HEP review, gait training, hip stabilization/strengthening    PT Home Exercise Plan  see eval 4/6    Consulted and Agree with Plan of Care  Patient       Patient will benefit from skilled therapeutic intervention in order to improve the following deficits and impairments:  Postural dysfunction, Decreased mobility, Abnormal gait, Increased fascial restricitons, Improper body mechanics, Pain, Decreased activity tolerance, Decreased strength, Decreased range of motion, Impaired flexibility, Difficulty walking  Visit Diagnosis: Pain in right hip  Stiffness of right hip, not elsewhere classified  Chronic bilateral low back pain without sciatica  Abnormal posture  Muscle spasm of back  Pain in left hip     Problem List Patient Active Problem List   Diagnosis Date Noted  . Chronic hip pain 11/07/2019  . Facial pain 09/22/2019  . GERD (gastroesophageal  reflux disease) 09/14/2019  . Palpitations 09/14/2019  . Family history of breast cancer gene mutation in first degree relative 04/07/2019  . Dysmenorrhea 01/18/2018  . Chronic bilateral low back pain without sciatica 06/05/2017  . Dermatitis 05/19/2016  . Perennial allergic rhinitis 05/19/2016  . Preventative health care 12/31/2015  . Hyperlipidemia 12/31/2015  . Infertility, female 12/20/2015  . ADHD (attention deficit hyperactivity disorder) 12/06/2015  . Recurrent UTI 12/06/2015   Shelton Silvas PT, DPT  Shelton Silvas 12/19/2019, 10:35 AM  [facility] [address] [place], [address] Phone: [phone]   Fax:  [phone]  Name: Debbie Bowen MRN: [id number] Date of Birth: 04-19-1981

## 2019-12-21 ENCOUNTER — Ambulatory Visit: Payer: 59 | Admitting: Physical Therapy

## 2019-12-26 ENCOUNTER — Encounter: Payer: Self-pay | Admitting: Physical Therapy

## 2019-12-26 ENCOUNTER — Other Ambulatory Visit: Payer: Self-pay

## 2019-12-26 ENCOUNTER — Ambulatory Visit: Payer: 59 | Admitting: Physical Therapy

## 2019-12-26 DIAGNOSIS — M25651 Stiffness of right hip, not elsewhere classified: Secondary | ICD-10-CM

## 2019-12-26 DIAGNOSIS — R293 Abnormal posture: Secondary | ICD-10-CM

## 2019-12-26 DIAGNOSIS — M25551 Pain in right hip: Secondary | ICD-10-CM | POA: Diagnosis not present

## 2019-12-26 DIAGNOSIS — M545 Low back pain, unspecified: Secondary | ICD-10-CM

## 2019-12-26 DIAGNOSIS — G8929 Other chronic pain: Secondary | ICD-10-CM

## 2019-12-26 NOTE — Therapy (Signed)
[facility] [address], [place], [address] Phone: [phone]   Fax:  [phone]  Physical Therapy Treatment  Patient Details  Name: Debbie Bowen MRN: [id number] Date of Birth: 1981-06-25 No data recorded  Encounter Date: 12/26/2019  PT End of Session - 12/26/19 0907    Visit Number  3    Number of Visits  17    Date for PT Re-Evaluation  01/20/20    PT Start Time  0901    PT Stop Time  0940    PT Time Calculation (min)  39 min    Activity Tolerance  Patient tolerated treatment well    Behavior During Therapy  [facility] for tasks assessed/performed       Past Medical History:  Diagnosis Date  . ADD (attention deficit disorder)    No medications  . BV (bacterial vaginosis)    recurrent  . Eczema   . GERD (gastroesophageal reflux disease)   . Insomnia   . Orthodontics    invisalign  . Scoliosis    no issues  . Urticaria   . UTI (lower urinary tract infection)    recurrent  . Wears contact lenses     Past Surgical History:  Procedure Laterality Date  . CERVIX LESION DESTRUCTION    . DILATION AND CURETTAGE OF UTERUS     for TAB  . ESOPHAGOGASTRODUODENOSCOPY (EGD) WITH PROPOFOL N/A 10/04/2015   Procedure: ESOPHAGOGASTRODUODENOSCOPY (EGD) WITH PROPOFOL;  Surgeon: Lucilla Lame, MD;  Location: [facility];  Service: Endoscopy;  Laterality: N/A;    There were no vitals filed for this visit.  Subjective Assessment - 12/26/19 0905    Subjective  Patient reports her pain was little better over the weekend, that she has been focusing on core contraction and thinks this is helping    Pertinent History  Pt is a 39 year old female presenting with chronic R hip pain over the past 2 years that she has recently started seeking treatment for. Initially saw PCP and PCP presumed this was LBP with hip radiation, MRI revealed small R hip labrum tear. She reports that her pain began 2 years ago after  she was going across monkey bars at her gym and fell off with her LEs extended. Reports she felt a pop and immediate pain and thinks this is her MOI. Patient reports pain is a fairly consistent dull ache, that can be sharp when she stands from prolonged sitting, or move from a criss-cross sitting position or crossed legs, but quickly dissipates. 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 She enjoys DIY projects at home and reports her hip makes it difficult to bend/stoop, and squat under her house. She also has difficulty with her hip feeling stiff when she is driving her manual car. Worst pain 7/10, best 0/10. Denies tingling/numbness with no radicular symptoms, but does report that occasionally she feels pain at distal ITB.  Pt denies N/V, B&B changes, unexplained weight fluctuation, saddle paresthesia, fever, night sweats, or unrelenting night pain at this time.    How long can you sit comfortably?  40mins    How long can you stand comfortably?  32mins    How long can you walk comfortably?  87mins    Diagnostic tests  MRI "small R hip labrum tear, Small area of calcification adjacent to the posterior aspect ofright greater trochanter at the gluteus minimus insertion  without associated tendinosis. Gluteal tendons and muscles are otherwisenormal.    Patient Stated Goals  Relieve pain    Pain Onset  More than a month ago       Ther-Ex Nustep seat setting 7 L4 68mins for gentle strengthening Hooklying ER GTB 3x 10 with cuing for eccentric control ER bridge with GTB 2x 10 with good carry over of proper technique following cuing Posterior pelvic tilt  Review with excellent carry over; deadbug LE only x10; full deadbug 2x 10  Plank from knees 3x 30sec hold with demo and heavy cuing for set up with min cuing needed throughout, excellent carry over Q-ped alt hip ext 2x 10 with max cuing for set up and initial set with min cuing for subsequent sets Elevated  split squat 2x 8 bilat with heavy cuing  Mini squat to elevated mat table 3x 10  with cuing for core contraction                          PT Education - 12/26/19 0907    Education Details  therex form/technique    Person(s) Educated  Patient    Methods  Explanation;Demonstration;Verbal cues    Comprehension  Verbalized understanding;Returned demonstration;Verbal cues required       PT Short Term Goals - 12/07/19 0938      PT SHORT TERM GOAL #1   Title  Pt will be independent with HEP in order to improve strength and decrease back pain in order to improve pain-free function at home and work.    Time  4    Period  Weeks    Status  New        PT Long Term Goals - 12/07/19 [id number]      PT LONG TERM GOAL #1   Title  Patient will increase FOTO score to 89 to demonstrate predicted increase in functional mobility to complete ADLs    Baseline  12/06/19 85    Time  6    Period  Weeks    Status  New      PT LONG TERM GOAL #2   Title  Pt will decrease worst hip pain as reported on NPRS by at least 2 points in order to demonstrate clinically significant reduction in back pain.    Baseline  12/06/19 7/10 pain at worst with squatting/transferring    Time  6    Period  Weeks    Status  New      PT LONG TERM GOAL #3   Title  Patient will demonstrate 5/5 hip ext, adduction, abduction, and ER MMT to order to demonstrate symmetry to LLE in order to complete heavy household tasks    Baseline  12/06/19 ext 4; add 4+; abd 4; ER 4+    Time  6    Period  Weeks    Status  New            Plan - 12/26/19 0930    Clinical Impression Statement  PT continued therex progression for core and hip activation with simple and compound movements with good success. Patient is able to comply with all cuing for technique, though she does require heavy cuing and demo for core contraction/spine neutral in against gravity positions. Patient with excellent carry carry over of previous therex,  and good motivation throughout session. PT will continue progression as able.    Personal Factors and Comorbidities  Sex;Fitness;Past/Current Experience;Time since onset of injury/illness/exacerbation  Examination-Activity Limitations  Squat;Lift;Bend;Sit    Examination-Participation Restrictions  Cleaning;Community Activity;Yard Work    [employer] officer  Evolving/Moderate complexity    Clinical Decision Making  Moderate    Rehab Potential  Good    PT Frequency  2x / week    PT Duration  8 weeks    PT Treatment/Interventions  ADLs/Self Care Home Management;Electrical Stimulation;Therapeutic activities;Patient/family education;Therapeutic exercise;Cryotherapy;Functional mobility training;Manual techniques;Dry needling;Passive range of motion;Joint Manipulations;Spinal Manipulations;Balance training;Gait training;Moist Heat;Traction;Ultrasound;Stair training;Neuromuscular re-education    PT Next Visit Plan  HEP review, gait training, hip stabilization/strengthening    PT Home Exercise Plan  see eval 4/6    Consulted and Agree with Plan of Care  Patient       Patient will benefit from skilled therapeutic intervention in order to improve the following deficits and impairments:  Postural dysfunction, Decreased mobility, Abnormal gait, Increased fascial restricitons, Improper body mechanics, Pain, Decreased activity tolerance, Decreased strength, Decreased range of motion, Impaired flexibility, Difficulty walking  Visit Diagnosis: Pain in right hip  Stiffness of right hip, not elsewhere classified  Chronic bilateral low back pain without sciatica  Abnormal posture     Problem List Patient Active Problem List   Diagnosis Date Noted  . Chronic hip pain 11/07/2019  . Facial pain 09/22/2019  . GERD (gastroesophageal reflux disease) 09/14/2019  . Palpitations 09/14/2019  . Family history of breast cancer gene mutation in first degree relative 04/07/2019  . Dysmenorrhea  01/18/2018  . Chronic bilateral low back pain without sciatica 06/05/2017  . Dermatitis 05/19/2016  . Perennial allergic rhinitis 05/19/2016  . Preventative health care 12/31/2015  . Hyperlipidemia 12/31/2015  . Infertility, female 12/20/2015  . ADHD (attention deficit hyperactivity disorder) 12/06/2015  . Recurrent UTI 12/06/2015   Shelton Silvas PT, DPT Shelton Silvas 12/26/2019, 9:44 AM  [facility] [address] [place], [address] Phone: [phone]   Fax:  [phone]  Name: Danajha Wiehe MRN: [id number] Date of Birth: 1980-11-30

## 2019-12-28 ENCOUNTER — Encounter: Payer: 59 | Admitting: Physical Therapy

## 2019-12-29 ENCOUNTER — Ambulatory Visit: Payer: 59 | Admitting: Physical Therapy

## 2020-01-02 ENCOUNTER — Other Ambulatory Visit: Payer: Self-pay

## 2020-01-02 ENCOUNTER — Ambulatory Visit: Payer: 59 | Attending: Primary Care | Admitting: Physical Therapy

## 2020-01-02 ENCOUNTER — Encounter: Payer: Self-pay | Admitting: Physical Therapy

## 2020-01-02 DIAGNOSIS — M25551 Pain in right hip: Secondary | ICD-10-CM | POA: Insufficient documentation

## 2020-01-02 DIAGNOSIS — M25651 Stiffness of right hip, not elsewhere classified: Secondary | ICD-10-CM | POA: Insufficient documentation

## 2020-01-02 DIAGNOSIS — G8929 Other chronic pain: Secondary | ICD-10-CM | POA: Insufficient documentation

## 2020-01-02 DIAGNOSIS — R293 Abnormal posture: Secondary | ICD-10-CM | POA: Diagnosis present

## 2020-01-02 DIAGNOSIS — M62830 Muscle spasm of back: Secondary | ICD-10-CM | POA: Insufficient documentation

## 2020-01-02 DIAGNOSIS — M545 Low back pain: Secondary | ICD-10-CM | POA: Diagnosis present

## 2020-01-02 NOTE — Therapy (Signed)
[facility] [address] [place], [address] Phone: [phone]   Fax:  [phone]  Physical Therapy Treatment  Patient Details  Name: Debbie Bowen MRN: [id number] Date of Birth: 05/31/1981 No data recorded  Encounter Date: 01/02/2020  PT End of Session - 01/02/20 0955    Visit Number  4    Number of Visits  17    Date for PT Re-Evaluation  01/20/20    PT Start Time  0950    PT Stop Time  1030    PT Time Calculation (min)  40 min    Activity Tolerance  Patient tolerated treatment well    Behavior During Therapy  [facility] for tasks assessed/performed       Past Medical History:  Diagnosis Date  . ADD (attention deficit disorder)    No medications  . BV (bacterial vaginosis)    recurrent  . Eczema   . GERD (gastroesophageal reflux disease)   . Insomnia   . Orthodontics    invisalign  . Scoliosis    no issues  . Urticaria   . UTI (lower urinary tract infection)    recurrent  . Wears contact lenses     Past Surgical History:  Procedure Laterality Date  . CERVIX LESION DESTRUCTION    . DILATION AND CURETTAGE OF UTERUS     for TAB  . ESOPHAGOGASTRODUODENOSCOPY (EGD) WITH PROPOFOL N/A 10/04/2015   Procedure: ESOPHAGOGASTRODUODENOSCOPY (EGD) WITH PROPOFOL;  Surgeon: Lucilla Lame, MD;  Location: [place];  Service: Endoscopy;  Laterality: N/A;    There were no vitals filed for this visit.  Subjective Assessment - 01/02/20 0954    Subjective  Patient reports pain only on Sunday following playing with her nepew. She has decreased pain overall, which she is happy with.    Pertinent History  Pt is a 39 year old female presenting with chronic R hip pain over the past 2 years that she has recently started seeking treatment for. Initially saw PCP and PCP presumed this was LBP with hip radiation, MRI revealed small R hip labrum tear. She reports that her pain began 2 years ago after she was  going across monkey bars at her gym and fell off with her LEs extended. Reports she felt a pop and immediate pain and thinks this is her MOI. Patient reports pain is a fairly consistent dull ache, that can be sharp when she stands from prolonged sitting, or move from a criss-cross sitting position or crossed legs, but quickly dissipates. 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 She enjoys DIY projects at home and reports her hip makes it difficult to bend/stoop, and squat under her house. She also has difficulty with her hip feeling stiff when she is driving her manual car. Worst pain 7/10, best 0/10. Denies tingling/numbness with no radicular symptoms, but does report that occasionally she feels pain at distal ITB.  Pt denies N/V, B&B changes, unexplained weight fluctuation, saddle paresthesia, fever, night sweats, or unrelenting night pain at this time.    How long can you sit comfortably?  40mins    How long can you stand comfortably?  52mins    How long can you walk comfortably?  54mins    Diagnostic tests  MRI "small R hip labrum tear, Small area of calcification adjacent to the posterior aspect ofright greater trochanter at the gluteus minimus insertion without associated  tendinosis. Gluteal tendons and muscles are otherwisenormal.    Patient Stated Goals  Relieve pain    Pain Onset  More than a month ago         Ther-Ex Nustep seat setting 7 L4 90mins for gentle strengthening Side bridge with clamshell 3x 10 bilat with increased difficulty with R hip side bridge Supine leg lowers 3x 6 from 90d to about 60d before lumbar ext; cuing to maintain spine contact with mat table with good carry over R hip hike 2x 10 with cuing for posture without excessive lateral shift with good carry over R hip hike with LLE swing 2x 10  bilat 10# farmers carry 110ft with min cuing for set up posture with good carry over; 10# unilateral L and R with min cuing  initially, good carry over Elevated split squat 3x 10 each with min cuing for technique at set up with good carry over following                            PT Education - 01/02/20 0955    Education Details  therex form/technique    Person(s) Educated  Patient    Methods  Explanation;Demonstration;Verbal cues    Comprehension  Verbalized understanding;Returned demonstration;Verbal cues required       PT Short Term Goals - 12/07/19 0938      PT SHORT TERM GOAL #1   Title  Pt will be independent with HEP in order to improve strength and decrease back pain in order to improve pain-free function at home and work.    Time  4    Period  Weeks    Status  New        PT Long Term Goals - 12/07/19 [id number]      PT LONG TERM GOAL #1   Title  Patient will increase FOTO score to 89 to demonstrate predicted increase in functional mobility to complete ADLs    Baseline  12/06/19 85    Time  6    Period  Weeks    Status  New      PT LONG TERM GOAL #2   Title  Pt will decrease worst hip pain as reported on NPRS by at least 2 points in order to demonstrate clinically significant reduction in back pain.    Baseline  12/06/19 7/10 pain at worst with squatting/transferring    Time  6    Period  Weeks    Status  New      PT LONG TERM GOAL #3   Title  Patient will demonstrate 5/5 hip ext, adduction, abduction, and ER MMT to order to demonstrate symmetry to LLE in order to complete heavy household tasks    Baseline  12/06/19 ext 4; add 4+; abd 4; ER 4+    Time  6    Period  Weeks    Status  New            Plan - 01/02/20 0957    Clinical Impression Statement  PT continued therex progression for core and hip stabilization with good success. Patient is able to comply with all cuing for proper technique of therex, and is motivated throughout session. PT will continue progression as able.    Personal Factors and Comorbidities  Sex;Fitness;Past/Current Experience;Time since  onset of injury/illness/exacerbation    Examination-Activity Limitations  Squat;Lift;Bend;Sit    Examination-Participation Restrictions  Cleaning;Community Activity;Yard Work    Education officer, community  complexity    Clinical Decision Making  Moderate    Rehab Potential  Good    PT Frequency  2x / week    PT Duration  8 weeks    PT Treatment/Interventions  ADLs/Self Care Home Management;Electrical Stimulation;Therapeutic activities;Patient/family education;Therapeutic exercise;Cryotherapy;Functional mobility training;Manual techniques;Dry needling;Passive range of motion;Joint Manipulations;Spinal Manipulations;Balance training;Gait training;Moist Heat;Traction;Ultrasound;Stair training;Neuromuscular re-education    PT Next Visit Plan  HEP review, gait training, hip stabilization/strengthening    PT Home Exercise Plan  see eval 4/6    Consulted and Agree with Plan of Care  Patient       Patient will benefit from skilled therapeutic intervention in order to improve the following deficits and impairments:  Postural dysfunction, Decreased mobility, Abnormal gait, Increased fascial restricitons, Improper body mechanics, Pain, Decreased activity tolerance, Decreased strength, Decreased range of motion, Impaired flexibility, Difficulty walking  Visit Diagnosis: Pain in right hip  Stiffness of right hip, not elsewhere classified  Abnormal posture  Chronic bilateral low back pain without sciatica  Muscle spasm of back     Problem List Patient Active Problem List   Diagnosis Date Noted  . Chronic hip pain 11/07/2019  . Facial pain 09/22/2019  . GERD (gastroesophageal reflux disease) 09/14/2019  . Palpitations 09/14/2019  . Family history of breast cancer gene mutation in first degree relative 04/07/2019  . Dysmenorrhea 01/18/2018  . Chronic bilateral low back pain without sciatica 06/05/2017  . Dermatitis 05/19/2016  . Perennial allergic rhinitis  05/19/2016  . Preventative health care 12/31/2015  . Hyperlipidemia 12/31/2015  . Infertility, female 12/20/2015  . ADHD (attention deficit hyperactivity disorder) 12/06/2015  . Recurrent UTI 12/06/2015   Shelton Silvas PT, DPT Shelton Silvas 01/02/2020, 10:25 AM  [place] [facility] [address] [place], [address] Phone: [phone]   Fax:  [phone]  Name: Latiesha Goudeau MRN: [id number] Date of Birth: [birth year]

## 2020-01-05 ENCOUNTER — Ambulatory Visit: Payer: 59 | Admitting: Physical Therapy

## 2020-01-05 ENCOUNTER — Encounter: Payer: Self-pay | Admitting: Physical Therapy

## 2020-01-05 ENCOUNTER — Other Ambulatory Visit: Payer: Self-pay

## 2020-01-05 DIAGNOSIS — M25551 Pain in right hip: Secondary | ICD-10-CM | POA: Diagnosis not present

## 2020-01-05 DIAGNOSIS — R293 Abnormal posture: Secondary | ICD-10-CM

## 2020-01-05 DIAGNOSIS — M25651 Stiffness of right hip, not elsewhere classified: Secondary | ICD-10-CM

## 2020-01-05 NOTE — Therapy (Signed)
[facility] [address] [place], [address] Phone: [phone]   Fax:  [phone]  Physical Therapy Treatment  Patient Details  Name: Debbie Bowen MRN: [id number] Date of Birth: Feb 18, 1981 No data recorded  Encounter Date: 01/05/2020  PT End of Session - 01/05/20 1627    Visit Number  5    Number of Visits  17    Date for PT Re-Evaluation  01/20/20    PT Start Time  0421    PT Stop Time  0500    PT Time Calculation (min)  39 min    Activity Tolerance  Patient tolerated treatment well    Behavior During Therapy  [facility] for tasks assessed/performed       Past Medical History:  Diagnosis Date  . ADD (attention deficit disorder)    No medications  . BV (bacterial vaginosis)    recurrent  . Eczema   . GERD (gastroesophageal reflux disease)   . Insomnia   . Orthodontics    invisalign  . Scoliosis    no issues  . Urticaria   . UTI (lower urinary tract infection)    recurrent  . Wears contact lenses     Past Surgical History:  Procedure Laterality Date  . CERVIX LESION DESTRUCTION    . DILATION AND CURETTAGE OF UTERUS     for TAB  . ESOPHAGOGASTRODUODENOSCOPY (EGD) WITH PROPOFOL N/A 10/04/2015   Procedure: ESOPHAGOGASTRODUODENOSCOPY (EGD) WITH PROPOFOL;  Surgeon: Lucilla Lame, MD;  Location: [place];  Service: Endoscopy;  Laterality: N/A;    There were no vitals filed for this visit.  Subjective Assessment - 01/05/20 1625    Subjective  Patient reports she is having no pain today, just a little soreness yesterday. Reports her HEP is going well.    Pertinent History  Pt is a 39 year old female presenting with chronic R hip pain over the past 2 years that she has recently started seeking treatment for. Initially saw PCP and PCP presumed this was LBP with hip radiation, MRI revealed small R hip labrum tear. She reports that her pain began 2 years ago after she was going across  monkey bars at her gym and fell off with her LEs extended. Reports she felt a pop and immediate pain and thinks this is her MOI. Patient reports pain is a fairly consistent dull ache, that can be sharp when she stands from prolonged sitting, or move from a criss-cross sitting position or crossed legs, but quickly dissipates. 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 She enjoys DIY projects at home and reports her hip makes it difficult to bend/stoop, and squat under her house. She also has difficulty with her hip feeling stiff when she is driving her manual car. Worst pain 7/10, best 0/10. Denies tingling/numbness with no radicular symptoms, but does report that occasionally she feels pain at distal ITB.  Pt denies N/V, B&B changes, unexplained weight fluctuation, saddle paresthesia, fever, night sweats, or unrelenting night pain at this time.    How long can you sit comfortably?  68mins    How long can you stand comfortably?  21mins    How long can you walk comfortably?  40mins    Diagnostic tests  MRI "small R hip labrum tear, Small area of calcification adjacent to the posterior aspect ofright greater trochanter at the gluteus minimus insertion without associated tendinosis. Gluteal  tendons and muscles are otherwisenormal.    Patient Stated Goals  Relieve pain    Pain Onset  More than a month ago          Ther-Ex Nustep seat setting 7 L4 63mins for gentle strengthening Side bridge with clamshell 3x 10 bilat with increased difficulty with R hip side bridge Plank alt hip ext 3x 10 with demo and cuing needed initially for proper plank form with good carry over Squat to mat table x10 with excellent cary over from previous session; with 10#DB in front bilat UE flex 3x 10 with min cuing for proper technique with good carry over R hip hike with LLE swing 2x 10  Lateral step down from 6in step 3x 10 with demo and min cuing for technique with good carry over                      PT Education - 01/05/20 1627    Education Details  therex form/technique    Person(s) Educated  Patient    Methods  Explanation;Demonstration;Verbal cues    Comprehension  Verbalized understanding;Returned demonstration;Verbal cues required       PT Short Term Goals - 12/07/19 0938      PT SHORT TERM GOAL #1   Title  Pt will be independent with HEP in order to improve strength and decrease back pain in order to improve pain-free function at home and work.    Time  4    Period  Weeks    Status  New        PT Long Term Goals - 12/07/19 [id number]      PT LONG TERM GOAL #1   Title  Patient will increase FOTO score to 89 to demonstrate predicted increase in functional mobility to complete ADLs    Baseline  12/06/19 85    Time  6    Period  Weeks    Status  New      PT LONG TERM GOAL #2   Title  Pt will decrease worst hip pain as reported on NPRS by at least 2 points in order to demonstrate clinically significant reduction in back pain.    Baseline  12/06/19 7/10 pain at worst with squatting/transferring    Time  6    Period  Weeks    Status  New      PT LONG TERM GOAL #3   Title  Patient will demonstrate 5/5 hip ext, adduction, abduction, and ER MMT to order to demonstrate symmetry to LLE in order to complete heavy household tasks    Baseline  12/06/19 ext 4; add 4+; abd 4; ER 4+    Time  6    Period  Weeks    Status  New            Plan - 01/05/20 1636    Clinical Impression Statement  PT continued therex progression for core and hip stability with good success. Patient is able to comply with all cuing for proper technique of therex and is motivated throughout session with no increased pain. PT will continue progression as able.    Personal Factors and Comorbidities  Sex;Fitness;Past/Current Experience;Time since onset of injury/illness/exacerbation    Examination-Activity Limitations  Squat;Lift;Bend;Sit    Examination-Participation  Restrictions  Cleaning;Community Activity;Yard Work    Stability/Clinical Decision Making  Evolving/Moderate complexity    Clinical Decision Making  Moderate    Rehab Potential  Good    PT Frequency  2x /  week    PT Duration  8 weeks    PT Treatment/Interventions  ADLs/Self Care Home Management;Electrical Stimulation;Therapeutic activities;Patient/family education;Therapeutic exercise;Cryotherapy;Functional mobility training;Manual techniques;Dry needling;Passive range of motion;Joint Manipulations;Spinal Manipulations;Balance training;Gait training;Moist Heat;Traction;Ultrasound;Stair training;Neuromuscular re-education    PT Next Visit Plan  HEP review, gait training, hip stabilization/strengthening    PT Home Exercise Plan  see eval 4/6       Patient will benefit from skilled therapeutic intervention in order to improve the following deficits and impairments:  Postural dysfunction, Decreased mobility, Abnormal gait, Increased fascial restricitons, Improper body mechanics, Pain, Decreased activity tolerance, Decreased strength, Decreased range of motion, Impaired flexibility, Difficulty walking  Visit Diagnosis: Pain in right hip  Stiffness of right hip, not elsewhere classified  Abnormal posture     Problem List Patient Active Problem List   Diagnosis Date Noted  . Chronic hip pain 11/07/2019  . Facial pain 09/22/2019  . GERD (gastroesophageal reflux disease) 09/14/2019  . Palpitations 09/14/2019  . Family history of breast cancer gene mutation in first degree relative 04/07/2019  . Dysmenorrhea 01/18/2018  . Chronic bilateral low back pain without sciatica 06/05/2017  . Dermatitis 05/19/2016  . Perennial allergic rhinitis 05/19/2016  . Preventative health care 12/31/2015  . Hyperlipidemia 12/31/2015  . Infertility, female 12/20/2015  . ADHD (attention deficit hyperactivity disorder) 12/06/2015  . Recurrent UTI 12/06/2015   Shelton Silvas PT, DPT Shelton Silvas 01/05/2020, 5:00 PM  [facility] [address], [place], [address] Phone: [phone]   Fax:  [phone]  Name: Debbie Bowen MRN: [id number] Date of Birth: 06-30-1981

## 2020-01-09 ENCOUNTER — Ambulatory Visit: Payer: 59 | Admitting: Physical Therapy

## 2020-01-11 ENCOUNTER — Encounter: Payer: 59 | Admitting: Physical Therapy

## 2020-01-17 ENCOUNTER — Encounter: Payer: Self-pay | Admitting: Physical Therapy

## 2020-01-17 ENCOUNTER — Other Ambulatory Visit: Payer: Self-pay

## 2020-01-17 ENCOUNTER — Ambulatory Visit: Payer: 59 | Admitting: Physical Therapy

## 2020-01-17 DIAGNOSIS — M25551 Pain in right hip: Secondary | ICD-10-CM

## 2020-01-17 DIAGNOSIS — G8929 Other chronic pain: Secondary | ICD-10-CM

## 2020-01-17 DIAGNOSIS — M62830 Muscle spasm of back: Secondary | ICD-10-CM

## 2020-01-17 DIAGNOSIS — R293 Abnormal posture: Secondary | ICD-10-CM

## 2020-01-17 DIAGNOSIS — M25651 Stiffness of right hip, not elsewhere classified: Secondary | ICD-10-CM

## 2020-01-17 NOTE — Therapy (Signed)
[facility] [address], [place], [address] Phone: [phone]   Fax:  [phone]  Physical Therapy Treatment  Patient Details  Name: Debbie Bowen MRN: [id number] Date of Birth: 1980-10-18 No data recorded  Encounter Date: 01/17/2020  PT End of Session - 01/17/20 1040    Visit Number  6    Number of Visits  17    Date for PT Re-Evaluation  01/20/20    PT Start Time  [id number]    PT Stop Time  1113    PT Time Calculation (min)  38 min    Activity Tolerance  Patient tolerated treatment well    Behavior During Therapy  [facility] for tasks assessed/performed       Past Medical History:  Diagnosis Date  . ADD (attention deficit disorder)    No medications  . BV (bacterial vaginosis)    recurrent  . Eczema   . GERD (gastroesophageal reflux disease)   . Insomnia   . Orthodontics    invisalign  . Scoliosis    no issues  . Urticaria   . UTI (lower urinary tract infection)    recurrent  . Wears contact lenses     Past Surgical History:  Procedure Laterality Date  . CERVIX LESION DESTRUCTION    . DILATION AND CURETTAGE OF UTERUS     for TAB  . ESOPHAGOGASTRODUODENOSCOPY (EGD) WITH PROPOFOL N/A 10/04/2015   Procedure: ESOPHAGOGASTRODUODENOSCOPY (EGD) WITH PROPOFOL;  Surgeon: Lucilla Lame, MD;  Location: [place];  Service: Endoscopy;  Laterality: N/A;    There were no vitals filed for this visit.  Subjective Assessment - 01/17/20 1035    Subjective  Patient reports that she was unable to come last week d/t family emergency and was a little more sore overall from not doing her exercises. No hip pain today.    Pertinent History  Pt is a 39 year old female presenting with chronic R hip pain over the past 2 years that she has recently started seeking treatment for. Initially saw PCP and PCP presumed this was LBP with hip radiation, MRI revealed small R hip labrum tear. She reports that her pain  began 2 years ago after she was going across monkey bars at her gym and fell off with her LEs extended. Reports she felt a pop and immediate pain and thinks this is her MOI. Patient reports pain is a fairly consistent dull ache, that can be sharp when she stands from prolonged sitting, or move from a criss-cross sitting position or crossed legs, but quickly dissipates. 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 She enjoys DIY projects at home and reports her hip makes it difficult to bend/stoop, and squat under her house. She also has difficulty with her hip feeling stiff when she is driving her manual car. Worst pain 7/10, best 0/10. Denies tingling/numbness with no radicular symptoms, but does report that occasionally she feels pain at distal ITB.  Pt denies N/V, B&B changes, unexplained weight fluctuation, saddle paresthesia, fever, night sweats, or unrelenting night pain at this time.    How long can you sit comfortably?  86mins    How long can you stand comfortably?  2mins    How long can you walk comfortably?  68mins    Diagnostic tests  MRI "small R hip labrum tear, Small area of calcification adjacent to the posterior aspect ofright greater  trochanter at the gluteus minimus insertion without associated tendinosis. Gluteal tendons and muscles are otherwisenormal.    Patient Stated Goals  Relieve pain    Pain Onset  More than a month ago         Ther-Ex Nustep seat setting 7 L4 92mins for gentle strengthening Side bridge with CL hip abd 3x 10 bilat with increased difficulty with maintaining R hip side bridge Plank alt hip ext 2x 10 with min cuing for set up with good carry over R SL bridge 3x 10 with min cuing for full bridge height R lateral step up onto 12in step 2x 10 with no push off from LLE  Triple hop 3x each LLE with symmetrical hop (approx 55ft) Alt skater lunge jumps 2x 10 with good carry over following demo and set without jump Alt  lunge jumps 3x 10/6/6 with good carry over following demo and cuing for "soft" landing                          PT Education - 01/17/20 1037    Education Details  therex form/technique    Person(s) Educated  Patient    Methods  Explanation;Demonstration;Verbal cues    Comprehension  Verbalized understanding;Returned demonstration;Verbal cues required       PT Short Term Goals - 12/07/19 0938      PT SHORT TERM GOAL #1   Title  Pt will be independent with HEP in order to improve strength and decrease back pain in order to improve pain-free function at home and work.    Time  4    Period  Weeks    Status  New        PT Long Term Goals - 12/07/19 [id number]      PT LONG TERM GOAL #1   Title  Patient will increase FOTO score to 89 to demonstrate predicted increase in functional mobility to complete ADLs    Baseline  12/06/19 85    Time  6    Period  Weeks    Status  New      PT LONG TERM GOAL #2   Title  Pt will decrease worst hip pain as reported on NPRS by at least 2 points in order to demonstrate clinically significant reduction in back pain.    Baseline  12/06/19 7/10 pain at worst with squatting/transferring    Time  6    Period  Weeks    Status  New      PT LONG TERM GOAL #3   Title  Patient will demonstrate 5/5 hip ext, adduction, abduction, and ER MMT to order to demonstrate symmetry to LLE in order to complete heavy household tasks    Baseline  12/06/19 ext 4; add 4+; abd 4; ER 4+    Time  6    Period  Weeks    Status  New            Plan - 01/17/20 1056    Clinical Impression Statement  PT continued therex progression for core and hip stability with good success. PT introduced plyometric training as well without increased pain. Patient is able to comply with all cuing for proper technique/therex. PT will reassess next session    Personal Factors and Comorbidities  Sex;Fitness;Past/Current Experience;Time since onset of injury/illness/exacerbation     Examination-Activity Limitations  Squat;Lift;Bend;Sit    Examination-Participation Restrictions  Cleaning;Community Activity;Yard Work    Merchant navy officer  Evolving/Moderate complexity  Clinical Decision Making  Moderate    Rehab Potential  Good    PT Frequency  2x / week    PT Duration  8 weeks    PT Treatment/Interventions  ADLs/Self Care Home Management;Electrical Stimulation;Therapeutic activities;Patient/family education;Therapeutic exercise;Cryotherapy;Functional mobility training;Manual techniques;Dry needling;Passive range of motion;Joint Manipulations;Spinal Manipulations;Balance training;Gait training;Moist Heat;Traction;Ultrasound;Stair training;Neuromuscular re-education    PT Next Visit Plan  continue POC    PT Home Exercise Plan  see eval 4/6    Consulted and Agree with Plan of Care  Patient       Patient will benefit from skilled therapeutic intervention in order to improve the following deficits and impairments:  Postural dysfunction, Decreased mobility, Abnormal gait, Increased fascial restricitons, Improper body mechanics, Pain, Decreased activity tolerance, Decreased strength, Decreased range of motion, Impaired flexibility, Difficulty walking  Visit Diagnosis: Pain in right hip  Stiffness of right hip, not elsewhere classified  Abnormal posture  Chronic bilateral low back pain without sciatica  Muscle spasm of back     Problem List Patient Active Problem List   Diagnosis Date Noted  . Chronic hip pain 11/07/2019  . Facial pain 09/22/2019  . GERD (gastroesophageal reflux disease) 09/14/2019  . Palpitations 09/14/2019  . Family history of breast cancer gene mutation in first degree relative 04/07/2019  . Dysmenorrhea 01/18/2018  . Chronic bilateral low back pain without sciatica 06/05/2017  . Dermatitis 05/19/2016  . Perennial allergic rhinitis 05/19/2016  . Preventative health care 12/31/2015  . Hyperlipidemia 12/31/2015  .  Infertility, female 12/20/2015  . ADHD (attention deficit hyperactivity disorder) 12/06/2015  . Recurrent UTI 12/06/2015   Shelton Silvas PT, DPT Shelton Silvas 01/17/2020, 11:39 AM  [facility] [facility] [address] [place], [address] Phone: [phone]   Fax:  [phone]  Name: Jeronica Hires MRN: [id number] Date of Birth: 25-Dec-1980

## 2020-01-19 ENCOUNTER — Other Ambulatory Visit: Payer: Self-pay

## 2020-01-19 ENCOUNTER — Ambulatory Visit: Payer: 59 | Admitting: Physical Therapy

## 2020-01-19 ENCOUNTER — Encounter: Payer: Self-pay | Admitting: Physical Therapy

## 2020-01-19 DIAGNOSIS — M25551 Pain in right hip: Secondary | ICD-10-CM | POA: Diagnosis not present

## 2020-01-19 DIAGNOSIS — M25651 Stiffness of right hip, not elsewhere classified: Secondary | ICD-10-CM

## 2020-01-19 DIAGNOSIS — R293 Abnormal posture: Secondary | ICD-10-CM

## 2020-01-19 NOTE — Therapy (Signed)
[facility] [address] [place], [address] Phone: [phone]   Fax:  [phone]  Physical Therapy Treatment/Discharge Summary Reporting Period 12/06/19 - 01/19/20  Patient Details  Name: Debbie Bowen MRN: [id number] Date of Birth: 05-01-1981 No data recorded  Encounter Date: 01/19/2020  PT End of Session - 01/19/20 1125    Visit Number  7    Number of Visits  17    Date for PT Re-Evaluation  01/20/20    PT Start Time  1120    PT Stop Time  1158    PT Time Calculation (min)  38 min    Activity Tolerance  Patient tolerated treatment well    Behavior During Therapy  [facility] for tasks assessed/performed       Past Medical History:  Diagnosis Date  . ADD (attention deficit disorder)    No medications  . BV (bacterial vaginosis)    recurrent  . Eczema   . GERD (gastroesophageal reflux disease)   . Insomnia   . Orthodontics    invisalign  . Scoliosis    no issues  . Urticaria   . UTI (lower urinary tract infection)    recurrent  . Wears contact lenses     Past Surgical History:  Procedure Laterality Date  . CERVIX LESION DESTRUCTION    . DILATION AND CURETTAGE OF UTERUS     for TAB  . ESOPHAGOGASTRODUODENOSCOPY (EGD) WITH PROPOFOL N/A 10/04/2015   Procedure: ESOPHAGOGASTRODUODENOSCOPY (EGD) WITH PROPOFOL;  Surgeon: Lucilla Lame, MD;  Location: [facility];  Service: Endoscopy;  Laterality: N/A;    There were no vitals filed for this visit.  Subjective Assessment - 01/19/20 1123    Subjective  Patient reports she has been sore from PT, but no pain, which she is happy with. Reports compliance with HEP.    Pertinent History  Pt is a 39 year old female presenting with chronic R hip pain over the past 2 years that she has recently started seeking treatment for. Initially saw PCP and PCP presumed this was LBP with hip radiation, MRI revealed small R hip labrum tear. She reports that her  pain began 2 years ago after she was going across monkey bars at her gym and fell off with her LEs extended. Reports she felt a pop and immediate pain and thinks this is her MOI. Patient reports pain is a fairly consistent dull ache, that can be sharp when she stands from prolonged sitting, or move from a criss-cross sitting position or crossed legs, but quickly dissipates. 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 She enjoys DIY projects at home and reports her hip makes it difficult to bend/stoop, and squat under her house. She also has difficulty with her hip feeling stiff when she is driving her manual car. Worst pain 7/10, best 0/10. Denies tingling/numbness with no radicular symptoms, but does report that occasionally she feels pain at distal ITB.  Pt denies N/V, B&B changes, unexplained weight fluctuation, saddle paresthesia, fever, night sweats, or unrelenting night pain at this time.    How long can you sit comfortably?  75mns    How long can you stand comfortably?  632ms    How long can you walk comfortably?  4558m    Diagnostic tests  MRI "small R hip labrum tear, Small area of calcification adjacent to the posterior aspect ofright greater trochanter at the  gluteus minimus insertion without associated tendinosis. Gluteal tendons and muscles are otherwisenormal.    Patient Stated Goals  Relieve pain    Pain Onset  More than a month ago      Ther-Ex Nustep seat setting 7 L4 28mns for gentle strengthening Plank alt hip abd 2x 10 with min cuing for set up with good carry over Spider plank/plank with oblique crunch 2x 10  R SL bridge 3x 10 with min cuing for full bridge height Discussion of other plank progressions completed in PT with visual aid (with hip ext, plank holds) and on these progressions to continue for core activation  Patient able to demonstrate 1 set of the following therex (that are bolded) in addition to above, with very min- no  cuing needed for correction. Patient able to verbalize understanding of all education on parameters and purpose of therex Access Code: [email or web address] .Modified Side Plank with Hip Abduction - 1 x daily - 2 x weekly - 3 sets - 10 reps .Plank with Oblique Crunch - 1 x daily - 2 x weekly - 3 sets - 10 reps .Plank with Hip Abduction - 1 x daily - 2 x weekly - 3 sets - 10 reps .Plank with Hip Extension - 1 x daily - 2 x weekly - 3 sets - 10 reps .Squatting Anti-Rotation Press - 1 x daily - 2 x weekly - 3 sets - 10 reps .Standing Cable Hip Abduction - 1 x daily - 2 x weekly - 3 sets - 10 reps .Single Leg Lunge with Foot on Bench - 1 x daily - 2 x weekly - 3 sets - 10 reps .Forward T with Weight - 1 x daily - 2 x weekly - 3 sets - 10 reps                             PT Education - 01/19/20 1124    Education Details  therex form/technique    Person(s) Educated  Patient    Methods  Explanation;Demonstration;Tactile cues;Verbal cues    Comprehension  Verbalized understanding;Returned demonstration;Verbal cues required;Tactile cues required       PT Short Term Goals - 01/19/20 1136      PT SHORT TERM GOAL #1   Title  Pt will be independent with HEP in order to improve strength and decrease back pain in order to improve pain-free function at home and work.    Baseline  01/19/20 Completing HEP without issue    Time  4    Period  Weeks    Status  Achieved        PT Long Term Goals - 01/19/20 1136      PT LONG TERM GOAL #1   Title  Patient will increase FOTO score to 89 to demonstrate predicted increase in functional mobility to complete ADLs    Baseline  12/06/19 85; 01/19/20 99    Time  6    Period  Weeks    Status  Achieved      PT LONG TERM GOAL #2   Title  Pt will decrease worst hip pain as reported on NPRS by at least 2 points in order to demonstrate clinically significant reduction in back pain.    Baseline  12/06/19 7/10 pain at worst with squatting/transferring;  01/19/20 2/10 pain with heavy ADLs    Time  6    Period  Weeks    Status  Achieved  PT LONG TERM GOAL #3   Title  Patient will demonstrate 5/5 hip ext, adduction, abduction, and ER MMT to order to demonstrate symmetry to LLE in order to complete heavy household tasks    Baseline  12/06/19 ext 4; add 4+; abd 4; ER 4+; 01/19/20 5/5 gross R hip strength    Time  6    Period  Weeks    Status  Achieved            Plan - 01/19/20 1244    Clinical Impression Statement  PT reassessed goals this session, where patient has made great progress. Patient has met all goals and is able to demonstrate and verbalize understanding of HEP for maintenance of progress. Pt given clinic contact info should any further questions or concerns arise. Pt to d/c PT.    Personal Factors and Comorbidities  Sex;Fitness;Past/Current Experience;Time since onset of injury/illness/exacerbation    Examination-Activity Limitations  Squat;Lift;Bend;Sit    Examination-Participation Restrictions  Cleaning;Community Activity;Yard Work    Merchant [employer] officer  Evolving/Moderate complexity    Clinical Decision Making  Moderate    Rehab Potential  Good    PT Frequency  2x / week    PT Duration  8 weeks    PT Treatment/Interventions  ADLs/Self Care Home Management;Electrical Stimulation;Therapeutic activities;Patient/family education;Therapeutic exercise;Cryotherapy;Functional mobility training;Manual techniques;Dry needling;Passive range of motion;Joint Manipulations;Spinal Manipulations;Balance training;Gait training;Moist Heat;Traction;Ultrasound;Stair training;Neuromuscular re-education    PT Next Visit Plan  d/c PT    PT Home Exercise Plan  see eval 4/6    Consulted and Agree with Plan of Care  Patient       Patient will benefit from skilled therapeutic intervention in order to improve the following deficits and impairments:  Postural dysfunction, Decreased mobility, Abnormal gait, Increased fascial  restricitons, Improper body mechanics, Pain, Decreased activity tolerance, Decreased strength, Decreased range of motion, Impaired flexibility, Difficulty walking  Visit Diagnosis: Pain in right hip  Stiffness of right hip, not elsewhere classified  Abnormal posture     Problem List Patient Active Problem List   Diagnosis Date Noted  . Chronic hip pain 11/07/2019  . Facial pain 09/22/2019  . GERD (gastroesophageal reflux disease) 09/14/2019  . Palpitations 09/14/2019  . Family history of breast cancer gene mutation in first degree relative 04/07/2019  . Dysmenorrhea 01/18/2018  . Chronic bilateral low back pain without sciatica 06/05/2017  . Dermatitis 05/19/2016  . Perennial allergic rhinitis 05/19/2016  . Preventative health care 12/31/2015  . Hyperlipidemia 12/31/2015  . Infertility, female 12/20/2015  . ADHD (attention deficit hyperactivity disorder) 12/06/2015  . Recurrent UTI 12/06/2015   Shelton Silvas PT, DPT Shelton Silvas 01/19/2020, 12:50 PM  [facility] [address] [place], [address] Phone: [phone]   Fax:  [phone]  Name: Debbie Bowen MRN: [id number] Date of Birth: Jan 15, 1981

## 2020-01-24 ENCOUNTER — Ambulatory Visit: Payer: 59 | Admitting: Physical Therapy

## 2020-01-26 ENCOUNTER — Ambulatory Visit: Payer: 59 | Admitting: Physical Therapy

## 2020-02-15 ENCOUNTER — Encounter: Payer: Self-pay | Admitting: Radiology

## 2020-03-20 ENCOUNTER — Encounter: Payer: Self-pay | Admitting: Primary Care

## 2020-03-20 ENCOUNTER — Other Ambulatory Visit: Payer: Self-pay

## 2020-03-20 ENCOUNTER — Ambulatory Visit: Payer: 59 | Admitting: Primary Care

## 2020-03-20 VITALS — BP 110/72 | HR 78 | Temp 96.6°F | Ht 67.0 in | Wt 140.0 lb

## 2020-03-20 DIAGNOSIS — R0683 Snoring: Secondary | ICD-10-CM

## 2020-03-20 DIAGNOSIS — F809 Developmental disorder of speech and language, unspecified: Secondary | ICD-10-CM | POA: Insufficient documentation

## 2020-03-20 DIAGNOSIS — K219 Gastro-esophageal reflux disease without esophagitis: Secondary | ICD-10-CM | POA: Diagnosis not present

## 2020-03-20 DIAGNOSIS — N390 Urinary tract infection, site not specified: Secondary | ICD-10-CM | POA: Diagnosis not present

## 2020-03-20 HISTORY — DX: Developmental disorder of speech and language, unspecified: F80.9

## 2020-03-20 MED ORDER — NITROFURANTOIN MACROCRYSTAL 50 MG PO CAPS: 50.0000 mg | ORAL_CAPSULE | ORAL | 0 refills | Status: DC | PRN

## 2020-03-20 NOTE — Assessment & Plan Note (Signed)
Chronic, with waking during the night and daytime drowsiness. Epworth Sleepiness Scale score of 16. She doesn't fit the clinical picture of OSA, but will send to neurology for evaluation.

## 2020-03-20 NOTE — Progress Notes (Signed)
Subjective:    Patient ID: Debbie Bowen, female    DOB: 11-Sep-1980, 39 y.o.   MRN: [id number]  HPI  This visit occurred during the SARS-CoV-2 public health emergency.  Safety protocols were in place, including screening questions prior to the visit, additional usage of staff PPE, and extensive cleaning of exam room while observing appropriate contact time as indicated for disinfecting solutions.   Debbie Bowen is a 39 year old female with a history of ADHD, chronic back pain, GERD, allergic rhinitis, recurrent UTI who presents today to discuss several symptoms.  She's noticed difficulty with communicating her thoughts for the last year. Worse over the last six months. She will end up thinking one thing and saying another thing. She is under a lot of stress with work, no more stress than usual. Her husband has really noticed this recently.   She also endorses chronic snoring, but worse over the last month. She will wake during the night gasping for air, sometimes choking. She will sometimes wake up with a sore throat. She started looking up symptoms online and found that she may have sleep apnea. She does feel very tired during the day, will nap at times when coming home from work.   She's no longer taking medications for esophageal reflux as her symptoms have improved. She denies a family history of sleep apnea.   Review of Systems  Respiratory: Negative for shortness of breath.   Cardiovascular: Negative for chest pain.  Gastrointestinal:       Intermittent esophageal reflux, sore throat at times in the morning.  Neurological:       Communication difficulty, snoring, waking during the night.  Psychiatric/Behavioral: Positive for sleep disturbance.       Past Medical History:  Diagnosis Date  . ADD (attention deficit disorder)    No medications  . BV (bacterial vaginosis)    recurrent  . Eczema   . GERD (gastroesophageal reflux disease)   . Insomnia   . Orthodontics     invisalign  . Scoliosis    no issues  . Urticaria   . UTI (lower urinary tract infection)    recurrent  . Wears contact lenses      Social History   Socioeconomic History  . Marital status: Single    Spouse name: Not on file  . Number of children: Not on file  . Years of education: 61  . Highest education level: Not on file  Occupational History  . Occupation: Sales promotion account executive    Comment: [employer]  Tobacco Use  . Smoking status: Never Smoker  . Smokeless tobacco: Never Used  Vaping Use  . Vaping Use: Never used  Substance and Sexual Activity  . Alcohol use: Yes    Alcohol/week: 2.0 standard drinks    Types: 2 Glasses of wine per week    Comment: occasionally  . Drug use: No  . Sexual activity: Yes    Partners: Male    Birth control/protection: None  Other Topics Concern  . Not on file  Social History Narrative   Single.    No children.   Self Employed.   Enjoys projects around her house.    Social Determinants of Health   Financial Resource Strain:   . Difficulty of Paying Living Expenses:   Food Insecurity:   . Worried About Charity fundraiser in the Last Year:   . Arboriculturist in the Last Year:   Transportation Needs:   .  Lack of Transportation (Medical):   Marland Kitchen Lack of Transportation (Non-Medical):   Physical Activity:   . Days of Exercise per Week:   . Minutes of Exercise per Session:   Stress:   . Feeling of Stress :   Social Connections:   . Frequency of Communication with Friends and Family:   . Frequency of Social Gatherings with Friends and Family:   . Attends Religious Services:   . Active Member of Clubs or Organizations:   . Attends Archivist Meetings:   Marland Kitchen Marital Status:   Intimate Partner Violence:   . Fear of Current or Ex-Partner:   . Emotionally Abused:   Marland Kitchen Physically Abused:   . Sexually Abused:     Past Surgical History:  Procedure Laterality Date  . CERVIX LESION DESTRUCTION    . DILATION AND  CURETTAGE OF UTERUS     for TAB  . ESOPHAGOGASTRODUODENOSCOPY (EGD) WITH PROPOFOL N/A 10/04/2015   Procedure: ESOPHAGOGASTRODUODENOSCOPY (EGD) WITH PROPOFOL;  Surgeon: Lucilla Lame, MD;  Location: [facility];  Service: Endoscopy;  Laterality: N/A;    Family History  Problem Relation Age of Onset  . Diabetes Mother   . Breast cancer Mother 39       Breast one side BRCA negative. Triple negative  . Juvenile idiopathic arthritis Father   . Eczema Father   . Diabetes Maternal Aunt   . Diabetes Paternal Aunt   . Breast cancer Paternal Grandmother   . Diabetes Paternal Grandmother   . Asthma Sister   . Asthma Brother   . Diabetes Maternal Grandmother   . Prostate cancer Maternal Grandfather   . Prostate cancer Paternal Grandfather   . Breast cancer Paternal Aunt 26  . Breast cancer Paternal Aunt 7  . Urticaria Neg Hx   . Allergic rhinitis Neg Hx   . Angioedema Neg Hx   . Immunodeficiency Neg Hx     No Known Allergies  Current Outpatient Medications on File Prior to Visit  Medication Sig Dispense Refill  . ibuprofen (ADVIL) 200 MG tablet Take 200 mg by mouth as needed.    Marland Kitchen letrozole (FEMARA) 2.5 MG tablet Take 2.5 mg by mouth as directed. Take for 5 days then off 1 month and repeat as directed     No current facility-administered medications on file prior to visit.    BP 110/72   Pulse 78   Temp (!) 96.6 F (35.9 C) (Temporal)   Ht 5' 7" (1.702 m)   Wt 140 lb (63.5 kg)   LMP 03/13/2020   SpO2 98%   BMI 21.93 kg/m    Objective:   Physical Exam Cardiovascular:     Rate and Rhythm: Normal rate and regular rhythm.  Pulmonary:     Effort: Pulmonary effort is normal.     Breath sounds: Normal breath sounds.  Musculoskeletal:     Cervical back: Neck supple.  Skin:    General: Skin is warm and dry.  Psychiatric:        Mood and Affect: Mood normal.            Assessment & Plan:

## 2020-03-20 NOTE — Assessment & Plan Note (Signed)
Refill provided for nitrofurantoin for which she uses sparingly for UTI symptoms after intercourse.

## 2020-03-20 NOTE — Assessment & Plan Note (Signed)
Could be contributing to symptoms of "choking" at night and "sore throat" during the morning. Recommended she resume treatment.

## 2020-03-20 NOTE — Patient Instructions (Signed)
You will be contacted regarding your referral to neurology.  Please let us know if you have not been contacted within two weeks.   It was a pleasure to see you today!

## 2020-03-20 NOTE — Assessment & Plan Note (Signed)
Unclear if this is actually neurological, could be stress or psychological. Exam today unremarkable. Will send to neurology for evaluation and sleep study.

## 2020-04-16 ENCOUNTER — Ambulatory Visit: Payer: 59 | Admitting: Family Medicine

## 2020-05-01 ENCOUNTER — Ambulatory Visit: Payer: 59 | Admitting: Obstetrics and Gynecology

## 2020-05-21 ENCOUNTER — Ambulatory Visit: Payer: 59

## 2020-05-22 ENCOUNTER — Ambulatory Visit (INDEPENDENT_AMBULATORY_CARE_PROVIDER_SITE_OTHER): Payer: 59

## 2020-05-22 ENCOUNTER — Other Ambulatory Visit: Payer: Self-pay

## 2020-05-22 ENCOUNTER — Ambulatory Visit: Payer: 59 | Admitting: Family Medicine

## 2020-05-22 VITALS — BP 119/80 | HR 52 | Ht 67.0 in | Wt 138.6 lb

## 2020-05-22 DIAGNOSIS — Z87440 Personal history of urinary (tract) infections: Secondary | ICD-10-CM

## 2020-05-22 LAB — POCT URINALYSIS DIPSTICK
Bilirubin, UA: NEGATIVE
Glucose, UA: NEGATIVE
Ketones, UA: NEGATIVE
Leukocytes, UA: NEGATIVE
Nitrite, UA: NEGATIVE
Protein, UA: NEGATIVE
Spec Grav, UA: 1.025 (ref 1.010–1.025)
Urobilinogen, UA: 0.2 E.U./dL
pH, UA: 6 (ref 5.0–8.0)

## 2020-05-22 NOTE — Progress Notes (Signed)
Patient seen and assessed by nursing staff.  Agree with documentation and plan.  

## 2020-05-22 NOTE — Progress Notes (Signed)
.  SUBJECTIVE: Debbie Bowen is a 39 y.o. female who complains of no urinary symptoms but states she has history of  reoccurring UTI's with no symptoms. Patient denies flank pain, fever, chills, or abnormal vaginal discharge or bleeding.   OBJECTIVE: Appears well, in no apparent distress.  Vital signs are normal. Urine dipstick shows negative for all components.    ASSESSMENT: History of recurrent UTI's  PLAN: Urine sent for culture.  Call or return to clinic prn if these symptoms worsen or fail to improve as anticipated.

## 2020-05-23 LAB — URINE CULTURE

## 2020-06-13 ENCOUNTER — Ambulatory Visit (INDEPENDENT_AMBULATORY_CARE_PROVIDER_SITE_OTHER): Payer: 59

## 2020-06-13 ENCOUNTER — Other Ambulatory Visit: Payer: Self-pay

## 2020-06-13 VITALS — BP 116/79 | HR 71 | Ht 66.0 in | Wt 140.2 lb

## 2020-06-13 DIAGNOSIS — Z87440 Personal history of urinary (tract) infections: Secondary | ICD-10-CM

## 2020-06-13 LAB — POCT URINALYSIS DIPSTICK
Bilirubin, UA: NEGATIVE
Glucose, UA: NEGATIVE
Ketones, UA: NEGATIVE
Leukocytes, UA: NEGATIVE
Nitrite, UA: NEGATIVE
Protein, UA: NEGATIVE
Spec Grav, UA: 1.015 (ref 1.010–1.025)
Urobilinogen, UA: 0.2 E.U./dL
pH, UA: 6 (ref 5.0–8.0)

## 2020-06-13 NOTE — Progress Notes (Signed)
Patient presents with the urgency to urinate x2days. Patient is currently on cycle with some abdominal cramping. Pt has no other UTI symptoms. UA Dipstick: Negative Will send urine for culture Pt instructed to take AZO by Maryelizabeth Kaufmann, CNM and to call office if symptoms worsen. Pt voiced understanding.

## 2020-06-13 NOTE — Progress Notes (Signed)
ATTESTATION OF SUPERVISION OF RN: Evaluation and management procedures were performed by the RN under my supervision and collaboration. I have reviewed the nursing note and chart and agree with the management and plan for this patient.  Alistair Senft, CNM  

## 2020-06-15 LAB — URINE CULTURE

## 2020-06-19 ENCOUNTER — Telehealth: Payer: Self-pay

## 2020-06-19 NOTE — Telephone Encounter (Signed)
Pt called to discuss urine culture results. Informed pt that results were negative. Advised pt to follow up with PCP or Urologist. Pt voiced understanding.

## 2020-06-23 ENCOUNTER — Other Ambulatory Visit: Payer: Self-pay | Admitting: Primary Care

## 2020-06-23 DIAGNOSIS — N390 Urinary tract infection, site not specified: Secondary | ICD-10-CM

## 2020-06-26 ENCOUNTER — Telehealth: Payer: Self-pay | Admitting: Primary Care

## 2020-06-26 DIAGNOSIS — Z803 Family history of malignant neoplasm of breast: Secondary | ICD-10-CM

## 2020-06-26 DIAGNOSIS — R922 Inconclusive mammogram: Secondary | ICD-10-CM

## 2020-06-26 NOTE — Telephone Encounter (Signed)
Please advise 

## 2020-06-26 NOTE — Telephone Encounter (Signed)
Pt called stating she had mammogram @ [facility] in [place]. And they recommend pt get an ultra sound.  Pt wanted to know how to schedule.  I didn't see results in chart.  Pt will call to have them fax results  Pt wanted to go somewhere local

## 2020-06-27 NOTE — Telephone Encounter (Signed)
Please notify patient that I reviewed her mammogram which did not show any evidence of malignancy.  I do see that she has heterogeneously dense breast tissue.  Did the mammogram technician or doctor tell her to get the ultrasound?  There were not any recommendations about an ultrasound and the report, but I am happy to order if she would like to proceed.  [place] or [place]?

## 2020-06-28 NOTE — Addendum Note (Signed)
Addended by: Pleas Koch on: 06/28/2020 02:05 PM   Modules accepted: Orders

## 2020-06-28 NOTE — Telephone Encounter (Signed)
Noted, orders placed. 

## 2020-06-28 NOTE — Telephone Encounter (Signed)
Called patient states that they told her at last mammo with family history and dense breast tissue they recommended she have one. She would like to have order sent to [facility]. Let her know you are out of office and will have delay in placing order. If she does not get call from them in two weeks will let us know.

## 2020-06-29 NOTE — Telephone Encounter (Signed)
Spoke with [facility]) states that If there are no new breast concerns or issues then routine MM in Feb 2022.  If having breast issues she could have Diag and US done - they do not perform US/Diag for dense tissue only. Other screening options for dense tissue is MRI of breast  Pt having some sharp pain in right breast - ongoing for 3-4 months, since last Mammogram Jan 2021. Diag Mammogram ordered, [place] orders updated to reflect the breast pain complaint.   Pt scheduled. Aware of appt. 07/30/20 @10 :40  Will send to Anda Kraft as FYI that I added a Diag Mammo order

## 2020-06-29 NOTE — Telephone Encounter (Signed)
I tried to [place] but it would not let me. Will have to wait for PCP

## 2020-06-29 NOTE — Addendum Note (Signed)
Addended by: Virl Cagey on: 06/29/2020 10:43 AM   Modules accepted: Orders

## 2020-06-30 NOTE — Telephone Encounter (Signed)
Orders co-signed 

## 2020-07-03 ENCOUNTER — Other Ambulatory Visit: Payer: Self-pay | Admitting: *Deleted

## 2020-07-03 ENCOUNTER — Inpatient Hospital Stay
Admission: RE | Admit: 2020-07-03 | Discharge: 2020-07-03 | Disposition: A | Discharge status: Home / Self Care | Payer: Self-pay | Source: Ambulatory Visit | Attending: *Deleted | Admitting: *Deleted

## 2020-07-03 DIAGNOSIS — Z1231 Encounter for screening mammogram for malignant neoplasm of breast: Secondary | ICD-10-CM

## 2020-07-30 ENCOUNTER — Ambulatory Visit
Admission: RE | Admit: 2020-07-30 | Discharge: 2020-07-30 | Disposition: A | Discharge status: Home / Self Care | Payer: 59 | Source: Ambulatory Visit | Attending: Primary Care | Admitting: Primary Care

## 2020-07-30 ENCOUNTER — Other Ambulatory Visit: Payer: Self-pay

## 2020-07-30 DIAGNOSIS — R922 Inconclusive mammogram: Secondary | ICD-10-CM

## 2020-07-30 DIAGNOSIS — Z803 Family history of malignant neoplasm of breast: Secondary | ICD-10-CM | POA: Diagnosis present

## 2020-07-30 IMAGING — MG MM DIGITAL DIAGNOSTIC UNILAT*R* W/ TOMO W/ CAD
6 series · 6 of 18 positions shown · non-contrast
Comparison: Previous exam(s).

CLINICAL DATA: 39-year-old female with focal, intermittent shooting
right breast pain for 6 months. Family history of breast cancer
recently diagnosed in her mother.

EXAM:
DIGITAL DIAGNOSTIC RIGHT MAMMOGRAM WITH CAD AND TOMO
ULTRASOUND RIGHT BREAST

[R CC synth-2D]
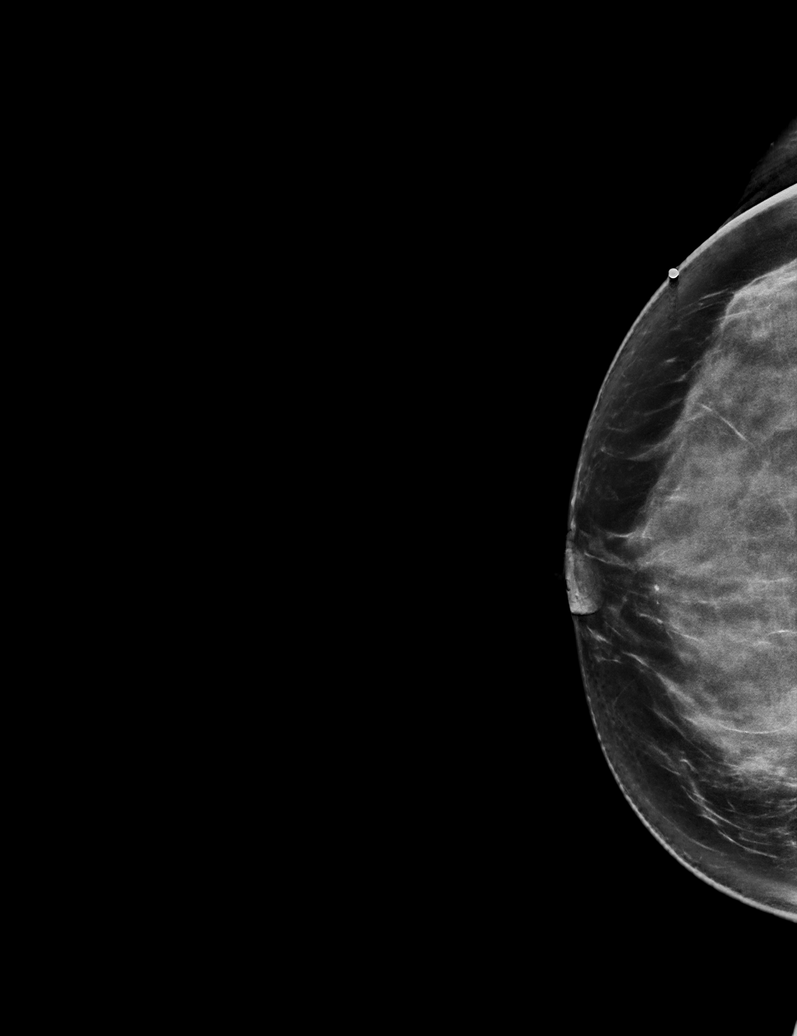

[R TAN synth-2D]
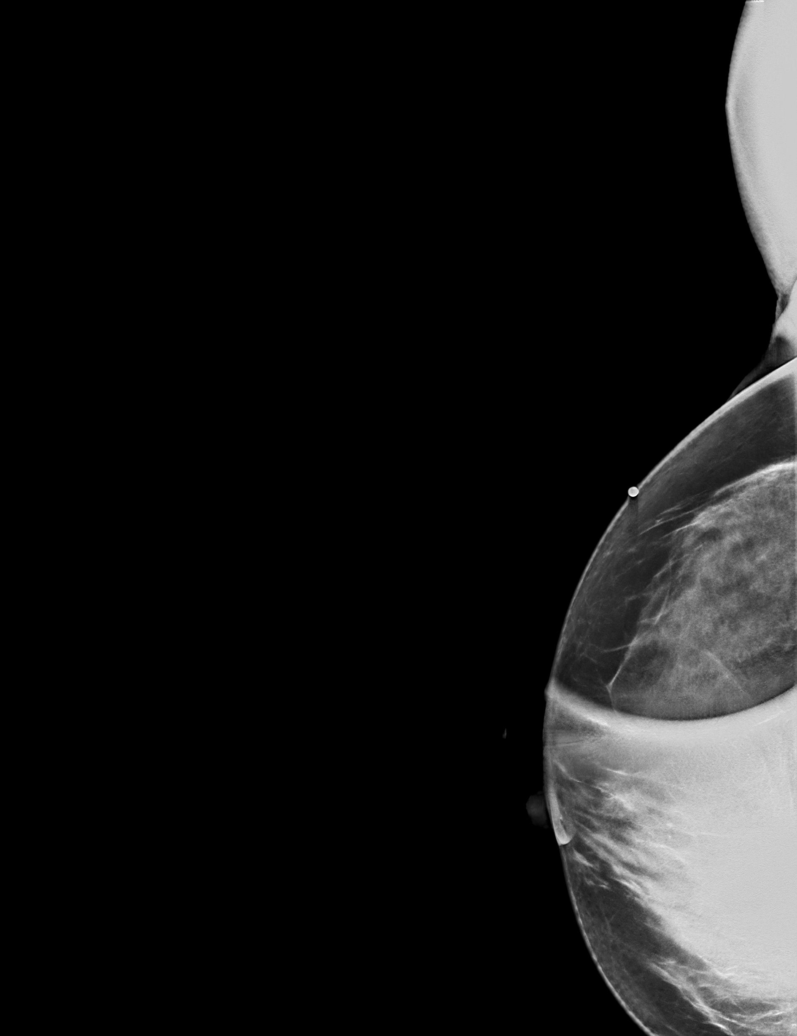

[R MLO synth-2D]
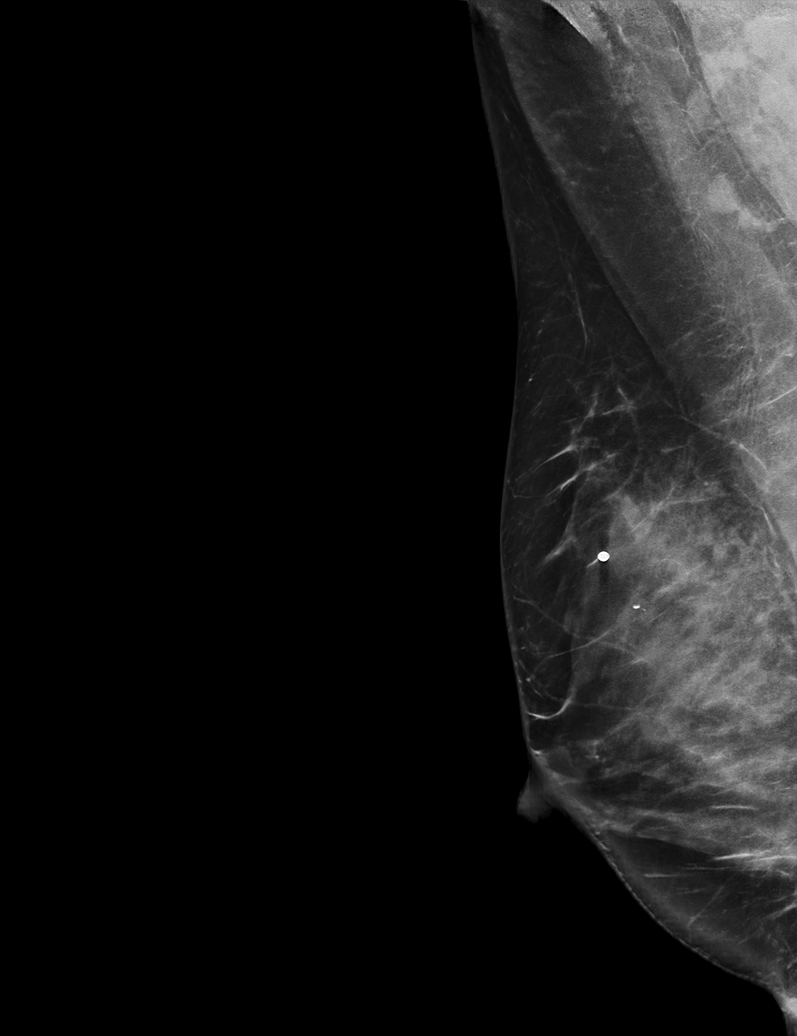

[R MLO tomo · tomo slice 36/71.0]
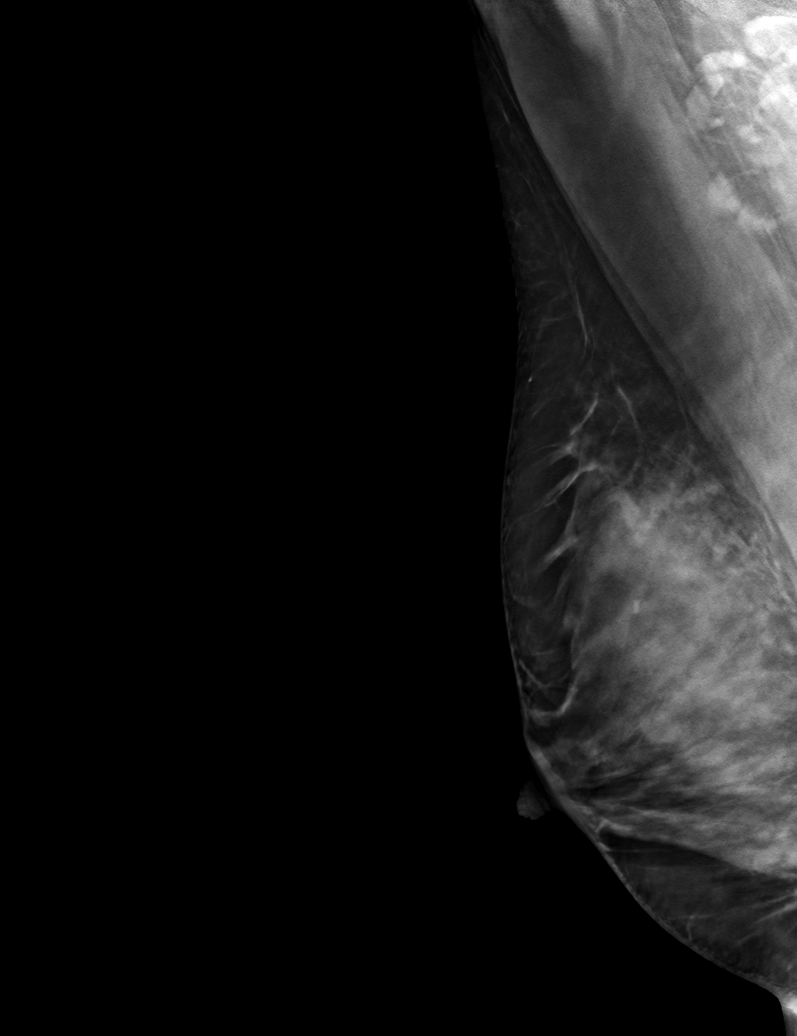

[R CC tomo · tomo slice 35/70.0]
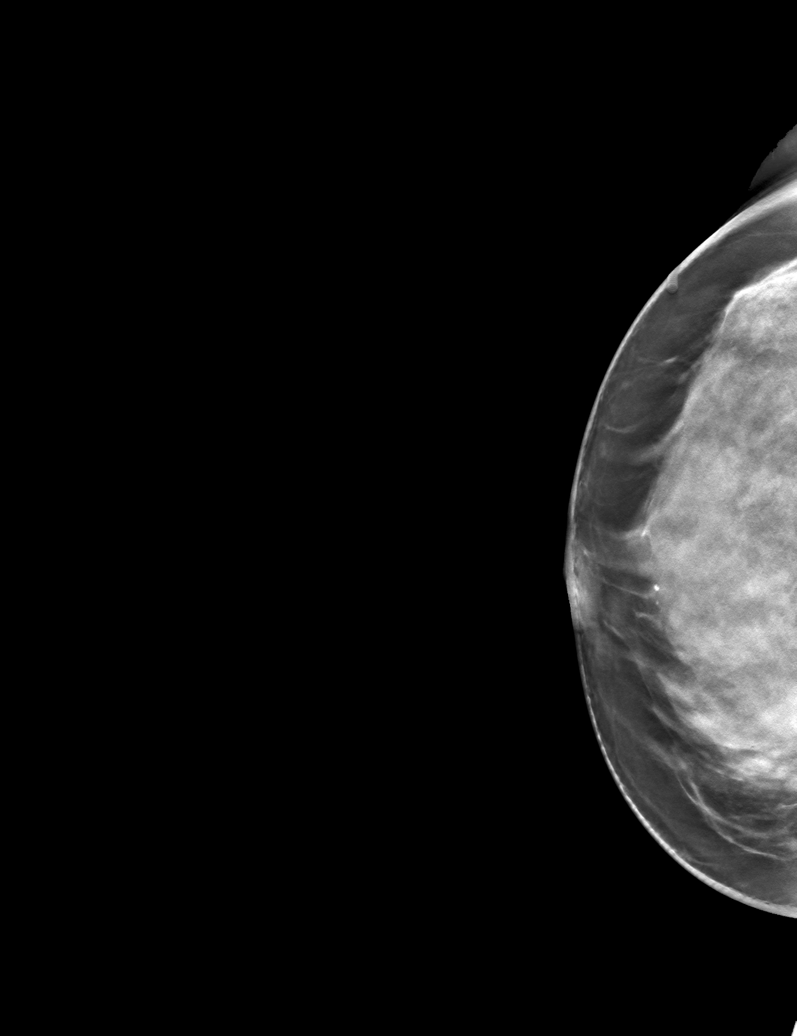

[R TAN tomo · tomo slice 32/63.0]
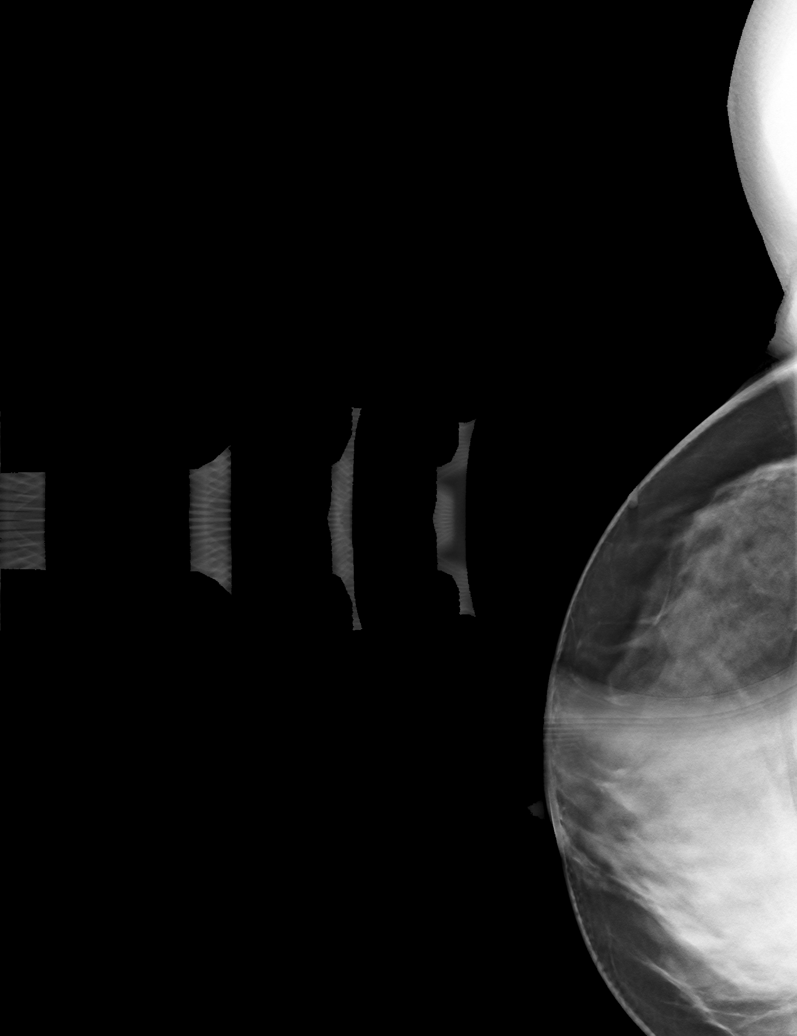

[6 of 18 positions shown; findings below may reference images not displayed]

ACR Breast Density Category d: The breast tissue is extremely dense,
which lowers the sensitivity of mammography.
FINDINGS: A radiopaque BB was placed at the site of the patient's focal right
breast pain in the upper outer quadrant. No focal or suspicious
mammographic findings are seen deep to the radiopaque BB or within
the remainder of the right breast.

Mammographic images were processed with CAD.

Targeted ultrasound is performed, showing dense fibroglandular
tissue without focal or suspicious sonographic abnormality.
Evaluation of the entire lateral right breast was performed.
IMPRESSION: No suspicious mammographic or sonographic findings corresponding
with the patient's focal, intermittent right breast pain.

RECOMMENDATION:
1. Clinical follow-up recommended for the painful area of concern in
the right breast. Any further workup should be based on clinical
grounds. Benign causes of breast pain, and possible remedies, were
discussed with the patient. Patient was encouraged to follow-up with
referring physician if pain became localized and persistent or if a
palpable lump/mass developed.
2. Annual bilateral screening mammography due in [DATE].

I have discussed the findings and recommendations with the patient.
If applicable, a reminder letter will be sent to the patient
regarding the next appointment.

BI-RADS CATEGORY  1: Negative.

## 2020-07-30 IMAGING — US US BREAST*R* LIMITED INC AXILLA
1 series · 2 of 2 positions shown · non-contrast
Comparison: Previous exam(s).

CLINICAL DATA: 39-year-old female with focal, intermittent shooting
right breast pain for 6 months. Family history of breast cancer
recently diagnosed in her mother.

EXAM:
DIGITAL DIAGNOSTIC RIGHT MAMMOGRAM WITH CAD AND TOMO
ULTRASOUND RIGHT BREAST

[Series 1: us breast*right* limited inc axilla · 0.07mm/px · 2 of 2 slices shown]
[im 1/2]
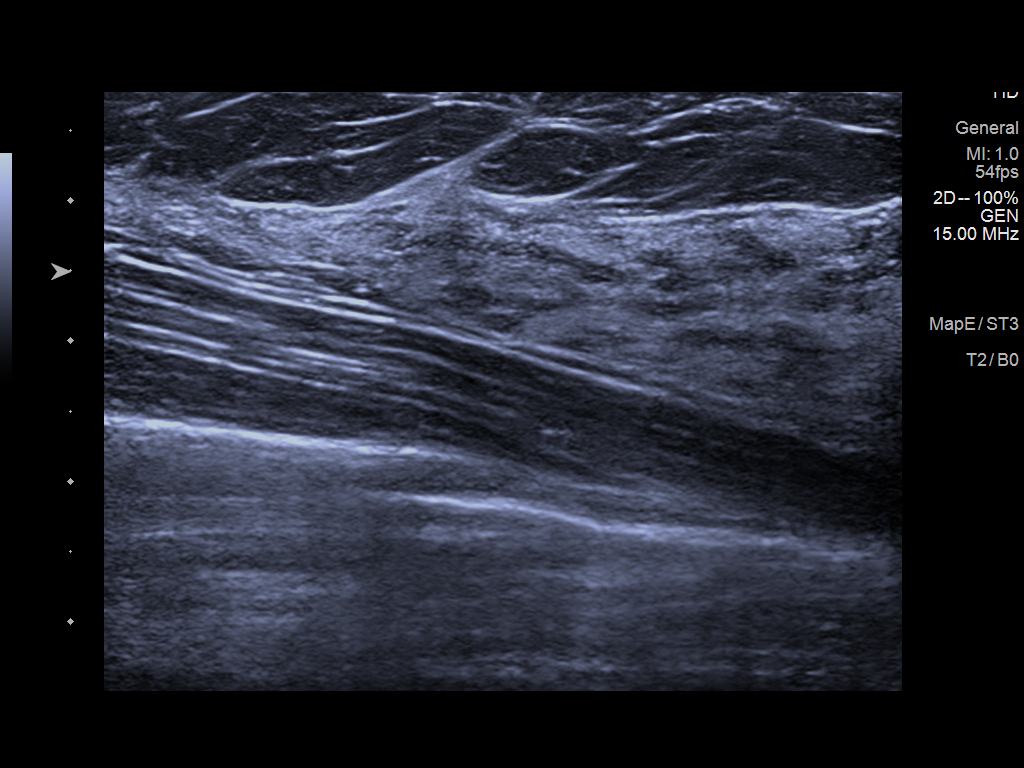
[im 2/2]
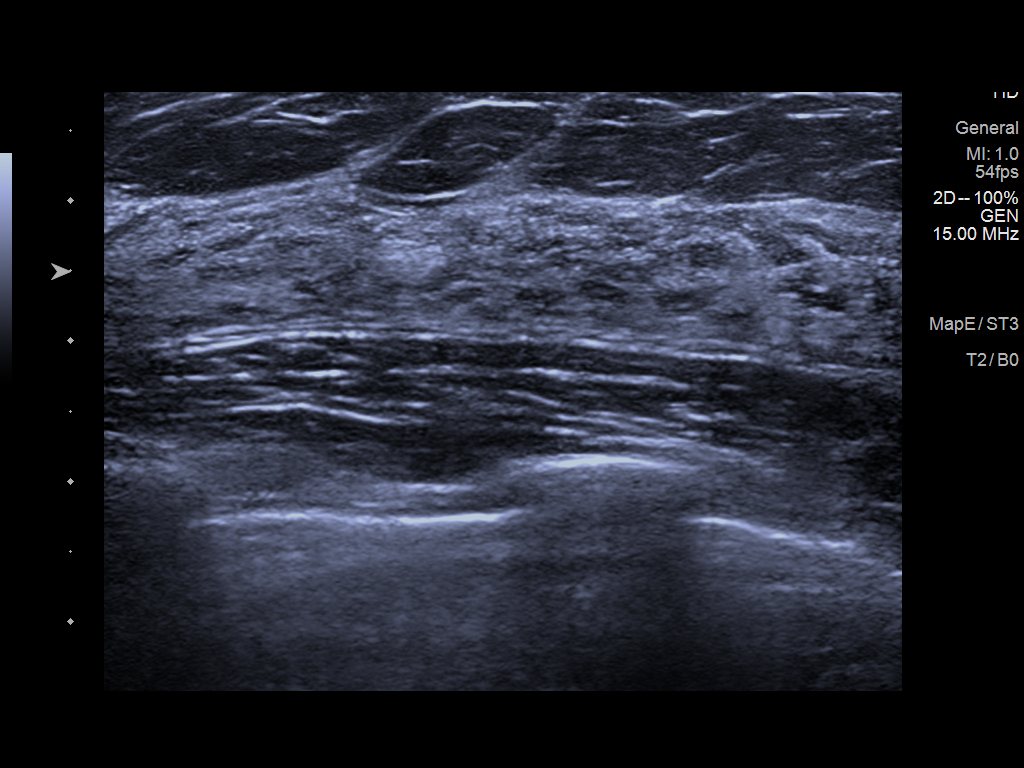

[2 of 2 positions shown; findings below may reference images not displayed]

ACR Breast Density Category d: The breast tissue is extremely dense,
which lowers the sensitivity of mammography.
FINDINGS: A radiopaque BB was placed at the site of the patient's focal right
breast pain in the upper outer quadrant. No focal or suspicious
mammographic findings are seen deep to the radiopaque BB or within
the remainder of the right breast.

Mammographic images were processed with CAD.

Targeted ultrasound is performed, showing dense fibroglandular
tissue without focal or suspicious sonographic abnormality.
Evaluation of the entire lateral right breast was performed.
IMPRESSION: No suspicious mammographic or sonographic findings corresponding
with the patient's focal, intermittent right breast pain.

RECOMMENDATION:
1. Clinical follow-up recommended for the painful area of concern in
the right breast. Any further workup should be based on clinical
grounds. Benign causes of breast pain, and possible remedies, were
discussed with the patient. Patient was encouraged to follow-up with
referring physician if pain became localized and persistent or if a
palpable lump/mass developed.
2. Annual bilateral screening mammography due in [DATE].

I have discussed the findings and recommendations with the patient.
If applicable, a reminder letter will be sent to the patient
regarding the next appointment.

BI-RADS CATEGORY  1: Negative.

## 2020-09-16 ENCOUNTER — Other Ambulatory Visit: Payer: Self-pay | Admitting: Primary Care

## 2020-09-16 DIAGNOSIS — N390 Urinary tract infection, site not specified: Secondary | ICD-10-CM

## 2020-10-17 ENCOUNTER — Telehealth: Payer: Self-pay | Admitting: Primary Care

## 2020-10-17 NOTE — Telephone Encounter (Signed)
FYI was not able to get any further information about the travel clinic due to time constraint I have called patient and recommended that she call the local health department. She also needed to have PCR test done within 72 hrs of leaving. I know ours have taken sometimes more than that to come back to gave information to [facility] to have test done. If any issues she will call and let us know.

## 2020-10-17 NOTE — Telephone Encounter (Signed)
Patient called She is traveling out of the country. She needs a prescription for the following vaccinations to be sent [facility] in [place].  Hepatitis A Malaria Meningitis Typhoid Yellow Fever

## 2020-10-17 NOTE — Telephone Encounter (Signed)
[facility] do not do any but hep A. Have sent message to co worker to see if she has the contact number for  travel clinic where patient can get all. Will need to contact patient with information once received.

## 2020-10-17 NOTE — Telephone Encounter (Signed)
Noted  

## 2020-10-18 NOTE — Telephone Encounter (Signed)
I have not seen her for this issue in over 1 year, please have her set up a visit.

## 2020-11-06 ENCOUNTER — Other Ambulatory Visit: Payer: Self-pay | Admitting: Primary Care

## 2020-11-06 DIAGNOSIS — E785 Hyperlipidemia, unspecified: Secondary | ICD-10-CM

## 2020-11-14 ENCOUNTER — Other Ambulatory Visit: Payer: 59

## 2020-11-19 ENCOUNTER — Other Ambulatory Visit (INDEPENDENT_AMBULATORY_CARE_PROVIDER_SITE_OTHER): Payer: 59

## 2020-11-19 ENCOUNTER — Other Ambulatory Visit: Payer: Self-pay

## 2020-11-19 ENCOUNTER — Other Ambulatory Visit: Payer: 59

## 2020-11-19 DIAGNOSIS — E785 Hyperlipidemia, unspecified: Secondary | ICD-10-CM

## 2020-11-19 LAB — COMPREHENSIVE METABOLIC PANEL
ALT: 42 U/L — ABNORMAL HIGH (ref 0–35)
AST: 50 U/L — ABNORMAL HIGH (ref 0–37)
Albumin: 4.1 g/dL (ref 3.5–5.2)
Alkaline Phosphatase: 43 U/L (ref 39–117)
BUN: 9 mg/dL (ref 6–23)
CO2: 28 mEq/L (ref 19–32)
Calcium: 9.1 mg/dL (ref 8.4–10.5)
Chloride: 103 mEq/L (ref 96–112)
Creatinine, Ser: 0.76 mg/dL (ref 0.40–1.20)
GFR: 98.06 mL/min (ref >60.00)
Glucose, Bld: 97 mg/dL (ref 70–99)
Potassium: 3.5 mEq/L (ref 3.5–5.1)
Sodium: 138 mEq/L (ref 135–145)
Total Bilirubin: 0.3 mg/dL (ref 0.2–1.2)
Total Protein: 7.4 g/dL (ref 6.0–8.3)

## 2020-11-19 LAB — LIPID PANEL
Cholesterol: 203 mg/dL — ABNORMAL HIGH (ref 0–200)
HDL: 53.6 mg/dL (ref >39.00)
LDL Cholesterol: 137 mg/dL — ABNORMAL HIGH (ref 0–99)
NonHDL: 149.53
Total CHOL/HDL Ratio: 4
Triglycerides: 63 mg/dL (ref 0.0–149.0)
VLDL: 12.6 mg/dL (ref 0.0–40.0)

## 2020-11-19 LAB — CBC
HCT: 37.7 % (ref 36.0–46.0)
Hemoglobin: 12.5 g/dL (ref 12.0–15.0)
MCHC: 33.2 g/dL (ref 30.0–36.0)
MCV: 83.9 fl (ref 78.0–100.0)
Platelets: 256 10*3/uL (ref 150.0–400.0)
RBC: 4.5 Mil/uL (ref 3.87–5.11)
RDW: 14.2 % (ref 11.5–15.5)
WBC: 3.3 10*3/uL — ABNORMAL LOW (ref 4.0–10.5)

## 2020-11-21 ENCOUNTER — Encounter: Payer: 59 | Admitting: Primary Care

## 2020-12-03 ENCOUNTER — Ambulatory Visit: Payer: 59 | Admitting: Family Medicine

## 2020-12-03 ENCOUNTER — Encounter: Payer: Self-pay | Admitting: Family Medicine

## 2020-12-03 ENCOUNTER — Other Ambulatory Visit: Payer: Self-pay

## 2020-12-03 DIAGNOSIS — L923 Foreign body granuloma of the skin and subcutaneous tissue: Secondary | ICD-10-CM | POA: Diagnosis not present

## 2020-12-03 MED ORDER — TRIAMCINOLONE ACETONIDE 0.1 % EX CREA: 1.0000 "application " | TOPICAL_CREAM | Freq: Every day | CUTANEOUS | 0 refills | Status: DC

## 2020-12-03 NOTE — Assessment & Plan Note (Signed)
Papular pruritic skin rash over recent tattoo.  I asked her to find out if any metallic salts were used in tattoo pigment. Continue moisturizing, add triamcinolone cream once daily for 1 week.  If persistent or prior to completing tattoo, recommend derm eval. Pt agrees with plan .

## 2020-12-03 NOTE — Progress Notes (Signed)
Patient ID: Debbie Bowen, female    DOB: [birth year], 40 y.o.   MRN: [id number]  This visit was conducted in person.  BP 120/80   Pulse 70   Temp 97.7 F (36.5 C) (Temporal)   Ht 5\' 6"  (1.676 m)   Wt 140 lb 5 oz (63.6 kg)   LMP 11/16/2020   SpO2 98%   BMI 22.65 kg/m    CC: rash  Subjective:   HPI: Debbie Bowen is a 40 y.o. female presenting on 12/03/2020 for Rash (C/o rash on upper right arm.  Noticed about 2 mos ago.  Area is occasionally itchy and is not improving. )   2 mo h/o rash to R upper arm at site of tattoo. Rash comes and goes - better with regular moisturizing cream use. Tattoo placed 07/2020 - at a shop in [place]. She did follow aftercare recommendations by tattoo parlor. Rash started about 1 month after tattoo. She is using moisturizing cream regularly three times a day with benefit.   Denies new medicines, supplements, creams, detergents, soaps or shampoos.  No new foods.   On femara and macrobid - no recent use however.  Denies fevers/chills, redness, warmth, draining.      Relevant past medical, surgical, family and social history reviewed and updated as indicated. Interim medical history since our last visit reviewed. Allergies and medications reviewed and updated. Outpatient Medications Prior to Visit  Medication Sig Dispense Refill  . ibuprofen (ADVIL) 200 MG tablet Take 200 mg by mouth as needed.    Marland Kitchen letrozole (FEMARA) 2.5 MG tablet Take 2.5 mg by mouth as directed. Take for 5 days then off 1 month and repeat as directed    . nitrofurantoin (MACRODANTIN) 50 MG capsule TAKE 1 CAPSULE (50 MG TOTAL) BY MOUTH AS NEEDED. FOR UTI PREVENTION. 30 capsule 0   No facility-administered medications prior to visit.     Per HPI unless specifically indicated in ROS section below Review of Systems Objective:  BP 120/80   Pulse 70   Temp 97.7 F (36.5 C) (Temporal)   Ht 5\' 6"  (1.676 m)   Wt 140 lb 5 oz (63.6 kg)   LMP 11/16/2020   SpO2 98%    BMI 22.65 kg/m   Wt Readings from Last 3 Encounters:  12/03/20 140 lb 5 oz (63.6 kg)  06/13/20 140 lb 3.2 oz (63.6 kg)  05/22/20 138 lb 9.6 oz (62.9 kg)      Physical Exam Vitals and nursing note reviewed.  Constitutional:      Appearance: Normal appearance. She is not ill-appearing.  Skin:    General: Skin is warm and dry.     Findings: Rash present. Rash is papular and scaling.     Comments: Papular pruritic rash over tattoo to right upper arm - both at red and dark blue pigments. Fine scaling posterior to tattoo   Neurological:     Mental Status: She is alert.       Assessment & Plan:  This visit occurred during the SARS-CoV-2 public health emergency.  Safety protocols were in place, including screening questions prior to the visit, additional usage of staff PPE, and extensive cleaning of exam room while observing appropriate contact time as indicated for disinfecting solutions.   Problem List Items Addressed This Visit    Tattoo reaction    Papular pruritic skin rash over recent tattoo.  I asked her to find out if any metallic salts were used in tattoo pigment. Continue  moisturizing, add triamcinolone cream once daily for 1 week.  If persistent or prior to completing tattoo, recommend derm eval. Pt agrees with plan .          Meds ordered this encounter  Medications  . triamcinolone (KENALOG) 0.1 %    Sig: Apply 1 application topically daily. Apply to AA    Dispense:  30 g    Refill:  0   No orders of the defined types were placed in this encounter.   Patient Instructions  I think this is reaction to tattoo.  Find out if any metallic salts were used in tattoo pigments.  Continue regular moistuirizing cream 2-3 times daily.  Add mid potency topical steroid (triamcinolone) once daily for 1 week.  If not improving or if planning to return to finish tattoo, I recommend touching base with dermatology - let us know if you you'd like a referral.   Follow up  plan: No follow-ups on file.  Ria Bush, MD

## 2020-12-03 NOTE — Patient Instructions (Addendum)
I think this is reaction to tattoo.  Find out if any metallic salts were used in tattoo pigments.  Continue regular moistuirizing cream 2-3 times daily.  Add mid potency topical steroid (triamcinolone) once daily for 1 week.  If not improving or if planning to return to finish tattoo, I recommend touching base with dermatology - let us know if you you'd like a referral.

## 2020-12-24 ENCOUNTER — Other Ambulatory Visit: Payer: Self-pay | Admitting: Neurology

## 2020-12-24 DIAGNOSIS — R4189 Other symptoms and signs involving cognitive functions and awareness: Secondary | ICD-10-CM

## 2021-01-03 ENCOUNTER — Ambulatory Visit: Payer: 59

## 2021-01-03 ENCOUNTER — Encounter: Payer: 59 | Admitting: Primary Care

## 2021-01-30 ENCOUNTER — Encounter: Payer: 59 | Admitting: Primary Care

## 2021-02-05 ENCOUNTER — Ambulatory Visit
Admission: RE | Admit: 2021-02-05 | Discharge: 2021-02-05 | Disposition: A | Discharge status: Home / Self Care | Payer: 59 | Source: Ambulatory Visit | Attending: Neurology | Admitting: Neurology

## 2021-02-05 ENCOUNTER — Other Ambulatory Visit: Payer: Self-pay

## 2021-02-05 DIAGNOSIS — R4189 Other symptoms and signs involving cognitive functions and awareness: Secondary | ICD-10-CM | POA: Insufficient documentation

## 2021-02-05 IMAGING — MR MR HEAD WO/W CM
14 series · 48 of 48 positions shown · IV contrast (gadavist)
Comparison: None.

CLINICAL DATA: Memory loss and word finding difficulty over the
last year. Headaches.

EXAM:
MRI HEAD WITHOUT AND WITH CONTRAST
TECHNIQUE: Multiplanar, multiecho pulse sequences of the brain and surrounding
structures were obtained without and with intravenous contrast.
CONTRAST:  6mL GADAVIST GADOBUTROL 1 MMOL/ML IV SOLN

[Series 5: ax dwi_tracew · axial · 3.0mm · 0.65mm/px · z∈[-112,+43]mm · 3 of 48 slices shown]
[im 1/48]
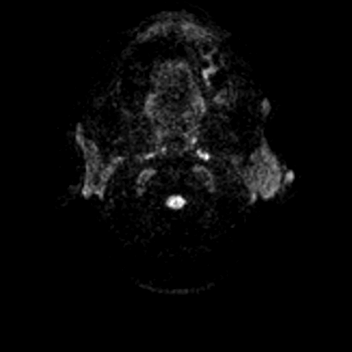
[im 24/48]
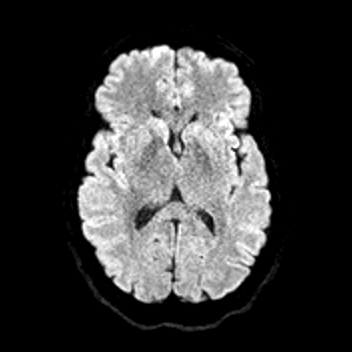
[im 48/48]
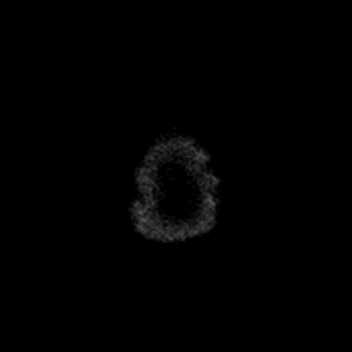

[Series 6: ax dwi_adc · axial · 3.0mm · 0.65mm/px · z∈[-112,+43]mm · 3 of 48 slices shown]
[im 1/48]
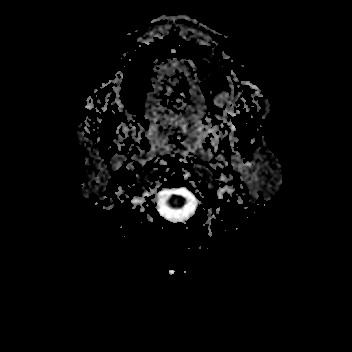
[im 24/48]
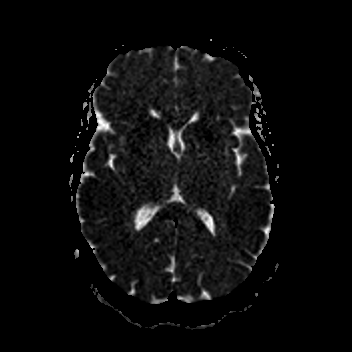
[im 48/48]
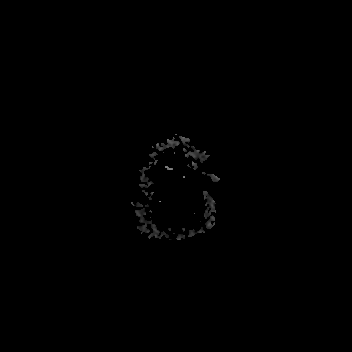

[Series 7: cor dwi_tracew · coronal · 5.0mm · 0.65mm/px · 2 of 40 slices shown]
[im 1/40]
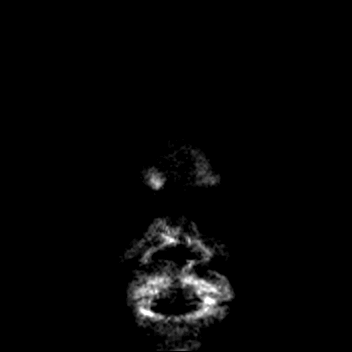
[im 40/40]
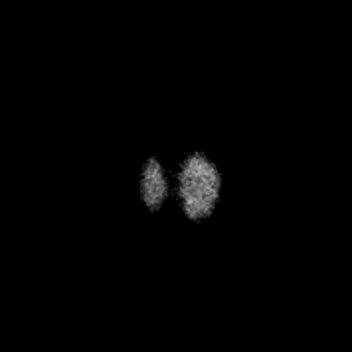

[Series 8: cor dwi_adc · coronal · 5.0mm · 0.65mm/px · 2 of 39 slices shown]
[im 1/39]
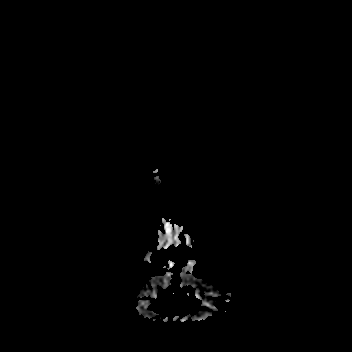
[im 39/39]
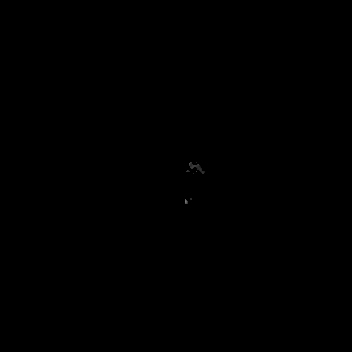

[Series 9: T1 · sagittal · 5.0mm · 0.62mm/px · 1 of 23 slices shown (1 of 2)]
[im 1/23]
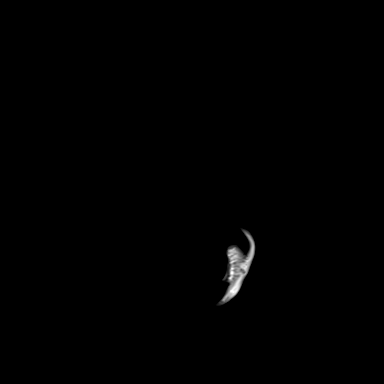

[Series 10: T2 · axial · 5.0mm · 0.53mm/px · 1 of 26 slices shown]
[im 1/26]
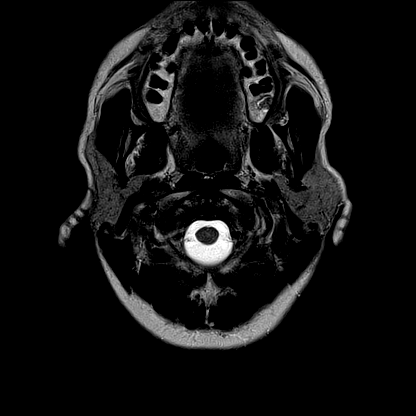

[Series 11: mag_images · axial · 3.0mm · 0.90mm/px · z∈[-122,+55]mm · 3 of 60 slices shown]
[im 1/60]
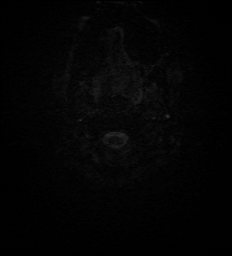
[im 30/60]
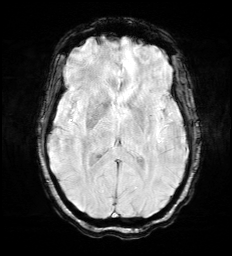
[im 60/60]
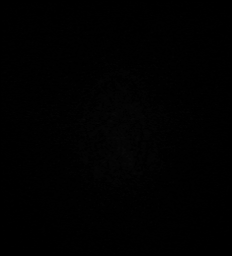

[Series 12: pha_images · axial · 3.0mm · 0.90mm/px · z∈[-122,+55]mm · 3 of 60 slices shown]
[im 1/60]
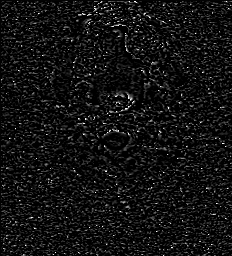
[im 30/60]
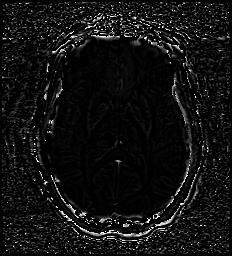
[im 60/60]
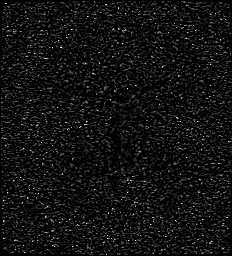

[Series 13: swi_images · axial · 3.0mm · 0.90mm/px · z∈[-122,+55]mm · 3 of 60 slices shown]
[im 1/60]
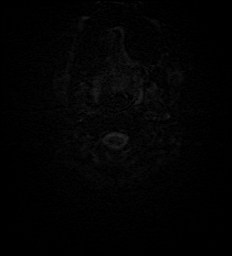
[im 30/60]
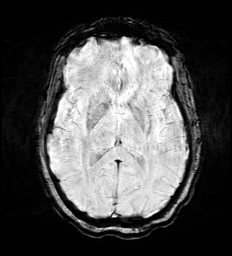
[im 60/60]
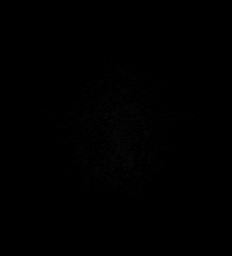

[Series 15: FLAIR · axial · 3.0mm · 0.53mm/px · z∈[-115,+47]mm · 3 of 55 slices shown]
[im 1/55]
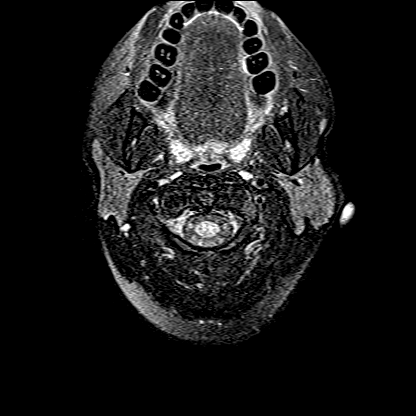
[im 28/55]
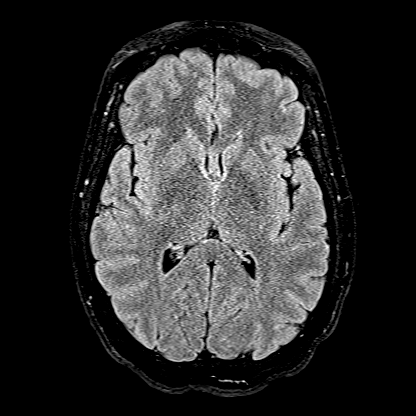
[im 55/55]
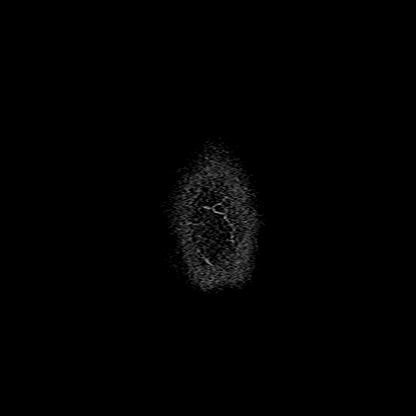

[Series 16: T1 · axial · 1.0mm · 0.98mm/px · z∈[-121,+54]mm · 10 of 176 slices shown (2 of 2)]
[im 1/176]
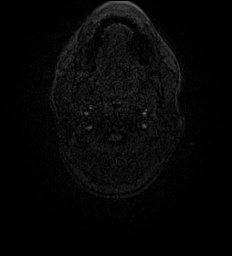
[im 20/176]
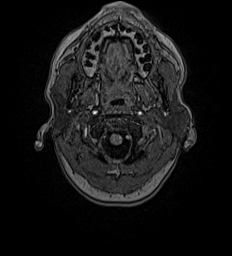
[im 39/176]
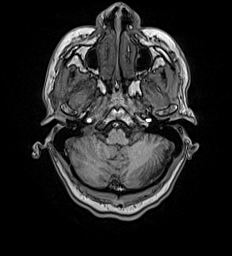
[im 59/176]
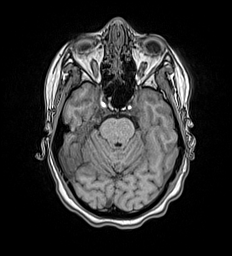
[im 78/176]
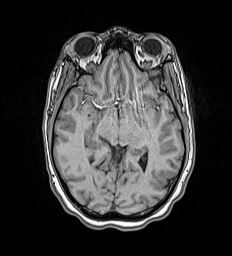
[im 98/176]
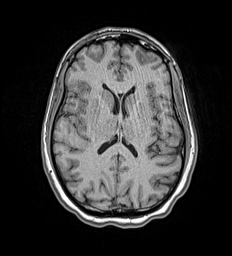
[im 117/176]
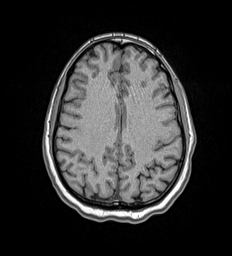
[im 137/176]
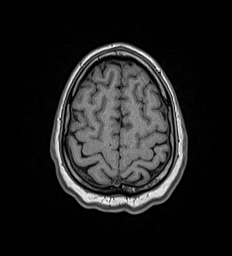
[im 156/176]
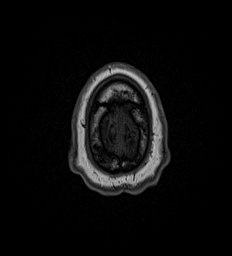
[im 176/176]
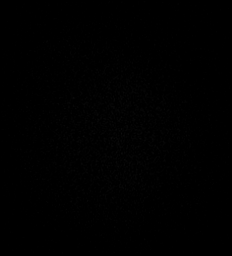

[Series 17: T2 post-contrast · coronal · 5.0mm · 0.57mm/px · 2 of 29 slices shown]
[im 1/29]
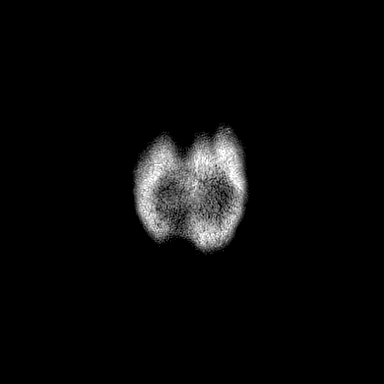
[im 29/29]
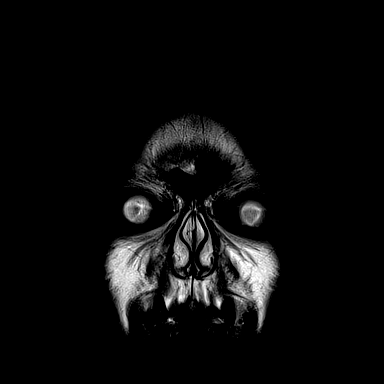

[Series 18: T1 post-contrast · axial · 1.0mm · 0.98mm/px · z∈[-121,+54]mm · 10 of 176 slices shown (1 of 2)]
[im 1/176]
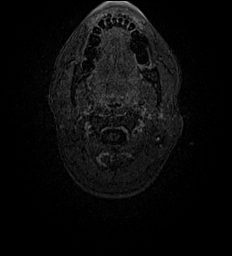
[im 20/176]
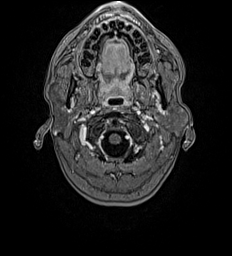
[im 39/176]
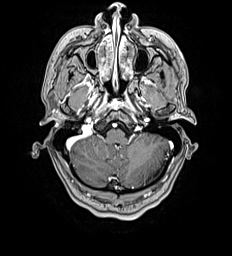
[im 59/176]
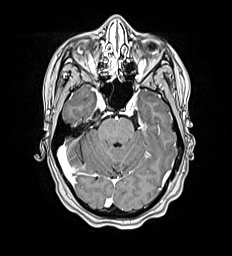
[im 78/176]
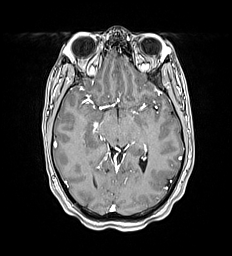
[im 98/176]
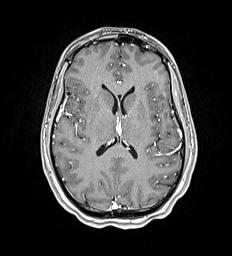
[im 117/176]
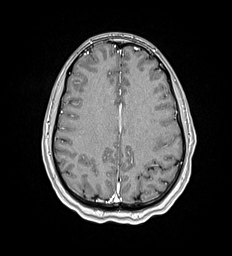
[im 137/176]
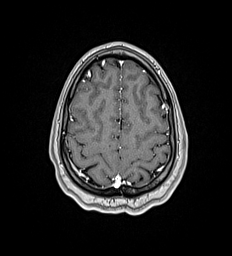
[im 156/176]
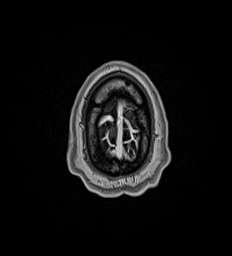
[im 176/176]
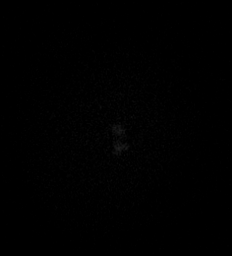

[Series 19: T1 post-contrast · coronal · 5.0mm · 0.57mm/px · 2 of 29 slices shown (2 of 2)]
[im 1/29]
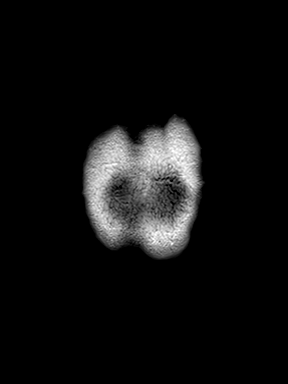
[im 29/29]
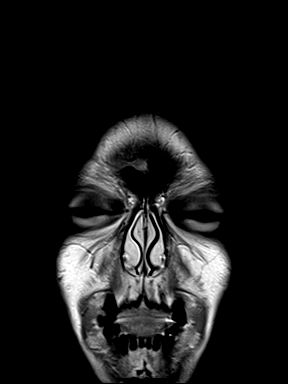

[48 of 48 positions shown; findings below may reference images not displayed]

FINDINGS: Brain: The brain has a normal appearance without evidence of
malformation, atrophy, old or acute small or large vessel
infarction, mass lesion, hemorrhage, hydrocephalus or extra-axial
collection. After contrast administration, no abnormal enhancement
occurs.

Vascular: Major vessels at the base of the brain show flow. Venous
sinuses appear patent.

Skull and upper cervical spine: Normal.

Sinuses/Orbits: Clear/normal.

Other: None significant.
IMPRESSION: Normal examination. No abnormality seen to explain the presenting
symptoms.

## 2021-02-05 MED ORDER — GADOBUTROL 1 MMOL/ML IV SOLN
6.0000 mL | Freq: Once | INTRAVENOUS | Status: AC | PRN
  Administered 2021-02-05: 6 mL via INTRAVENOUS

## 2021-02-12 ENCOUNTER — Encounter: Payer: Self-pay | Admitting: Primary Care

## 2021-02-12 ENCOUNTER — Telehealth: Payer: Self-pay

## 2021-02-12 ENCOUNTER — Other Ambulatory Visit: Payer: Self-pay

## 2021-02-12 ENCOUNTER — Ambulatory Visit (INDEPENDENT_AMBULATORY_CARE_PROVIDER_SITE_OTHER): Payer: 59 | Admitting: Primary Care

## 2021-02-12 VITALS — BP 110/78 | HR 74 | Temp 98.6°F | Ht 66.0 in | Wt 141.0 lb

## 2021-02-12 DIAGNOSIS — R002 Palpitations: Secondary | ICD-10-CM

## 2021-02-12 DIAGNOSIS — N390 Urinary tract infection, site not specified: Secondary | ICD-10-CM | POA: Diagnosis not present

## 2021-02-12 DIAGNOSIS — Z0001 Encounter for general adult medical examination with abnormal findings: Secondary | ICD-10-CM

## 2021-02-12 DIAGNOSIS — N979 Female infertility, unspecified: Secondary | ICD-10-CM

## 2021-02-12 DIAGNOSIS — L500 Allergic urticaria: Secondary | ICD-10-CM | POA: Diagnosis not present

## 2021-02-12 DIAGNOSIS — F809 Developmental disorder of speech and language, unspecified: Secondary | ICD-10-CM | POA: Diagnosis not present

## 2021-02-12 DIAGNOSIS — F411 Generalized anxiety disorder: Secondary | ICD-10-CM | POA: Insufficient documentation

## 2021-02-12 DIAGNOSIS — Z8481 Family history of carrier of genetic disease: Secondary | ICD-10-CM

## 2021-02-12 DIAGNOSIS — Z1231 Encounter for screening mammogram for malignant neoplasm of breast: Secondary | ICD-10-CM

## 2021-02-12 DIAGNOSIS — E785 Hyperlipidemia, unspecified: Secondary | ICD-10-CM

## 2021-02-12 LAB — POC URINALSYSI DIPSTICK (AUTOMATED)
Bilirubin, UA: NEGATIVE
Glucose, UA: NEGATIVE
Ketones, UA: NEGATIVE
Leukocytes, UA: NEGATIVE
Nitrite, UA: NEGATIVE
Protein, UA: NEGATIVE
Spec Grav, UA: 1.025 (ref 1.010–1.025)
Urobilinogen, UA: 0.2 E.U./dL
pH, UA: 6 (ref 5.0–8.0)

## 2021-02-12 MED ORDER — ESCITALOPRAM OXALATE 10 MG PO TABS: 10.0000 mg | ORAL_TABLET | Freq: Every day | ORAL | 1 refills | Status: DC

## 2021-02-12 MED ORDER — NITROFURANTOIN MACROCRYSTAL 50 MG PO CAPS: 50.0000 mg | ORAL_CAPSULE | ORAL | 0 refills | Status: DC | PRN

## 2021-02-12 NOTE — Progress Notes (Signed)
Subjective:    Patient ID: Debbie Bowen, female    DOB: 1980-10-10, 40 y.o.   MRN: [id number]  HPI  Debbie Bowen is a very pleasant 40 y.o. female who presents today for complete physical.  She thinks she may have had a UTI last week. Symptoms began about one week ago with dysuria, urinary frequency, suprapubic cramping. She took two of her Macrobid (100 mg) on Monday, then took 50 mg every 8 hours for an additional two days. Symptoms improved last week. She uses her Macrobid PRN for UTI prevention, does well on this regimen.   She would also like to discuss anxiety and ADHD. Symptoms include feeling anxious/nervous, hard to relax, constant worry, mind racing thoughts. She tried mediation without improvement. She was once initiated on Zoloft, took one dose which caused her to feel jittery so she stopped.   Immunizations: -Tetanus: 2013 -Influenza: Did not receive last season  -Covid-19: Completed 2 vaccines  Diet: Fair diet.  Exercise: No regular exercise.  Eye exam: Completes annually  Dental exam: Completes semi-annually   Pap Smear: August 2020 Mammogram: Due, orders placed  BP Readings from Last 3 Encounters:  02/12/21 110/78  12/03/20 120/80  06/13/20 116/79       Review of Systems  Constitutional:  Negative for unexpected weight change.  HENT:  Negative for rhinorrhea.   Eyes:  Negative for visual disturbance.  Respiratory:  Negative for cough and shortness of breath.   Cardiovascular:  Negative for chest pain.  Gastrointestinal:  Negative for constipation and diarrhea.  Genitourinary:  Positive for menstrual problem. Negative for difficulty urinating.       Recurrent UTI, recent symptoms. See HPI  Musculoskeletal:  Negative for arthralgias.  Skin:  Negative for rash.  Allergic/Immunologic: Negative for environmental allergies.  Neurological:  Negative for dizziness and headaches.  Psychiatric/Behavioral:  The patient is  nervous/anxious.         Past Medical History:  Diagnosis Date   ADD (attention deficit disorder)    No medications   BV (bacterial vaginosis)    recurrent   Eczema    GERD (gastroesophageal reflux disease)    Insomnia    Orthodontics    invisalign   Scoliosis    no issues   Urticaria    UTI (lower urinary tract infection)    recurrent   Wears contact lenses     Social History   Socioeconomic History   Marital status: Married    Spouse name: Not on file   Number of children: Not on file   Years of education: 16   Highest education level: Not on file  Occupational History   Occupation: Sales promotion account executive    Comment: [employer]  Tobacco Use   Smoking status: Never   Smokeless tobacco: Never  Vaping Use   Vaping Use: Never used  Substance and Sexual Activity   Alcohol use: Yes    Alcohol/week: 2.0 standard drinks    Types: 2 Glasses of wine per week    Comment: occasionally   Drug use: No   Sexual activity: Yes    Partners: Male    Birth control/protection: None  Other Topics Concern   Not on file  Social History Narrative   Single.    No children.   Self Employed.   Enjoys projects around her house.    Social Determinants of Health   Financial Resource Strain: Not on file  Food Insecurity: Not on file  Transportation Needs: Not on  file  Physical Activity: Not on file  Stress: Not on file  Social Connections: Not on file  Intimate Partner Violence: Not on file    Past Surgical History:  Procedure Laterality Date   CERVIX LESION DESTRUCTION     DILATION AND CURETTAGE OF UTERUS     for TAB   ESOPHAGOGASTRODUODENOSCOPY (EGD) WITH PROPOFOL N/A 10/04/2015   Procedure: ESOPHAGOGASTRODUODENOSCOPY (EGD) WITH PROPOFOL;  Surgeon: Lucilla Lame, MD;  Location: [facility];  Service: Endoscopy;  Laterality: N/A;    Family History  Problem Relation Age of Onset   Diabetes Mother    Breast cancer Mother 4       Breast one side BRCA  negative. Triple negative   Juvenile idiopathic arthritis Father    Eczema Father    Diabetes Maternal Aunt    Diabetes Paternal 32    Breast cancer Paternal Grandmother    Diabetes Paternal Grandmother    Asthma Sister    Asthma Brother    Diabetes Maternal Grandmother    Prostate cancer Maternal Grandfather    Prostate cancer Paternal Grandfather    Breast cancer Paternal Aunt 16   Breast cancer Paternal Aunt 71   Urticaria Neg Hx    Allergic rhinitis Neg Hx    Angioedema Neg Hx    Immunodeficiency Neg Hx     No Known Allergies  Current Outpatient Medications on File Prior to Visit  Medication Sig Dispense Refill   ibuprofen (ADVIL) 200 MG tablet Take 200 mg by mouth as needed.     clobetasol ointment (TEMOVATE) 1.97 % Apply 1 application topically 2 (two) times daily.     clomiPHENE (CLOMID) 50 MG tablet Take by mouth.     No current facility-administered medications on file prior to visit.    BP 110/78   Pulse 74   Temp 98.6 F (37 C) (Temporal)   Ht _0  (1.676 m)   Wt 141 lb (64 kg)   LMP 02/05/2021   SpO2 98%   BMI 22.76 kg/m  Objective:   Physical Exam HENT:     Right Ear: Tympanic membrane and ear canal normal.     Left Ear: Tympanic membrane and ear canal normal.     Nose: Nose normal.  Eyes:     Conjunctiva/sclera: Conjunctivae normal.     Pupils: Pupils are equal, round, and reactive to light.  Neck:     Thyroid: No thyromegaly.  Cardiovascular:     Rate and Rhythm: Normal rate and regular rhythm.     Heart sounds: No murmur heard. Pulmonary:     Effort: Pulmonary effort is normal.     Breath sounds: Normal breath sounds. No rales.  Abdominal:     General: Bowel sounds are normal.     Palpations: Abdomen is soft.     Tenderness: There is no abdominal tenderness.  Musculoskeletal:        General: Normal range of motion.     Cervical back: Neck supple.  Lymphadenopathy:     Cervical: No cervical adenopathy.  Skin:    General: Skin is  warm and dry.     Findings: No rash.  Neurological:     Mental Status: She is alert and oriented to person, place, and time.     Cranial Nerves: No cranial nerve deficit.     Deep Tendon Reflexes: Reflexes are normal and symmetric.  Psychiatric:        Mood and Affect: Mood normal.  Assessment & Plan:      This visit occurred during the SARS-CoV-2 public health emergency.  Safety protocols were in place, including screening questions prior to the visit, additional usage of staff PPE, and extensive cleaning of exam room while observing appropriate contact time as indicated for disinfecting solutions.

## 2021-02-12 NOTE — Assessment & Plan Note (Signed)
Improved from prior check. Continue to monitor.

## 2021-02-12 NOTE — Telephone Encounter (Signed)
[facility] Night - Client Nonclinical Telephone Record AccessNurse Client [facility] Night - Client Client Site [place] Physician Alma Friendly - NP Contact Type Call Who Is Calling Patient / Member / Family / Caregiver Caller Name Kazandra Forstrom Phone Number [phone] Call Type Message Only Information Provided Reason for Call Returning a Call from the Office Initial Latta states she received a call about confirming and answering questions before appointment tomorrow with Dr. Carlis Abbott at [phone]. Disp. Time Disposition Final User 02/11/2021 5:36:10 PM General Information Provided Yes Dineen Kid Call Closed By: Dineen Kid Transaction Date/Time: 02/11/2021 5:34:14 PM (ET)

## 2021-02-12 NOTE — Assessment & Plan Note (Signed)
Chronic, uncontrolled, ready for treatment.  Discussed options, she prefers to try medication. Rx for Lexapro 10 mg sent to pharmacy. Patient is to take 1/2 tablet daily for 8 days, then advance to 1 full tablet thereafter. We discussed possible side effects of headache, GI upset, drowsiness, and SI/HI. If thoughts of SI/HI develop, we discussed to present to the emergency immediately. Patient verbalized understanding.   She will update via My Chart in 6 weeks.

## 2021-02-12 NOTE — Patient Instructions (Addendum)
Start escitalopram (Lexapro) 10 mg for anxiety.  Start by taking 1/2 tablet daily for about one week, then increase to 1 full tablet thereafter.  Please update me via My Chart in 1 week regarding anxiety.  It was a pleasure to see you today!

## 2021-02-12 NOTE — Assessment & Plan Note (Signed)
Continued, follow with Neurology. Negative MRI, was told symptoms may be ADHD/anxiety.   See notes under ADHD.

## 2021-02-12 NOTE — Assessment & Plan Note (Signed)
Using clobetasol PRN, follows with dermatologist.

## 2021-02-12 NOTE — Assessment & Plan Note (Signed)
Recent symptoms per patient, will check UA today. Continue PRN Macrobid 50 mg, refills provided.

## 2021-02-12 NOTE — Assessment & Plan Note (Signed)
Following with [facility], is undergoing IUI.

## 2021-02-12 NOTE — Assessment & Plan Note (Signed)
Negative cardiology work up, will work to treat anxiety.

## 2021-02-12 NOTE — Assessment & Plan Note (Signed)
Due for screening mammogram. Ordered and pending.

## 2021-02-12 NOTE — Assessment & Plan Note (Signed)
Immunizations UTD. Pap smear UTD.  Discussed the importance of a healthy diet and regular exercise in order for weight loss, and to reduce the risk of further co-morbidity.  Exam today as noted. Labs pending.

## 2021-05-13 ENCOUNTER — Other Ambulatory Visit: Payer: Self-pay

## 2021-05-13 ENCOUNTER — Ambulatory Visit: Payer: 59 | Attending: Neurology | Admitting: Speech Pathology

## 2021-05-13 DIAGNOSIS — R41841 Cognitive communication deficit: Secondary | ICD-10-CM | POA: Insufficient documentation

## 2021-05-13 NOTE — Therapy (Signed)
[place] MAIN [facility] SERVICES [address] [place], [place], [address] Phone: [phone]   Fax:  [phone]  Speech Language Pathology Evaluation  Patient Details  Name: Debbie Bowen MRN: [id number] Date of Birth: [birth year] Referring Provider (SLP): Dr. Jennings Books   Encounter Date: 05/13/2021   End of Session - 05/13/21 1302     Visit Number 1    Number of Visits 1    Date for SLP Re-Evaluation 05/13/21    SLP Start Time 41    SLP Stop Time  1208    SLP Time Calculation (min) 62 min    Activity Tolerance Patient tolerated treatment well             Past Medical History:  Diagnosis Date   ADD (attention deficit disorder)    No medications   BV (bacterial vaginosis)    recurrent   Eczema    GERD (gastroesophageal reflux disease)    Insomnia    Orthodontics    invisalign   Scoliosis    no issues   Urticaria    UTI (lower urinary tract infection)    recurrent   Wears contact lenses     Past Surgical History:  Procedure Laterality Date   CERVIX LESION DESTRUCTION     DILATION AND CURETTAGE OF UTERUS     for TAB   ESOPHAGOGASTRODUODENOSCOPY (EGD) WITH PROPOFOL N/A 10/04/2015   Procedure: ESOPHAGOGASTRODUODENOSCOPY (EGD) WITH PROPOFOL;  Surgeon: Lucilla Lame, MD;  Location: [facility];  Service: Endoscopy;  Laterality: N/A;    There were no vitals filed for this visit.   Subjective Assessment - 05/13/21 1111     Subjective "I've been having some issues with my memory and be able to formulate the words I want to say."    Currently in Pain? No/denies                SLP Evaluation [facility] - 05/13/21 1111       SLP Visit Information   SLP Received On 05/13/21    Referring Provider (SLP) Dr. Jennings Books    Onset Date 05/09/21   referral date; pt reports onset in 2020   Medical Diagnosis R41.89 Other symptoms and signs involving cognitive functions and awareness      General Information    HPI Patient is a 40 year old female with history of anxiety, ADD, and B12 deficiency referred by Dr. Manuella Ghazi for communication difficulties which she reports began in 2020. MRI 02/05/21 with no abnormal findings. Per neurology notes "likely consequence of stress" and "low concern for neurodegenerative process at this time."    Behavioral/Cognition alert, cooperative    Mobility Status ambulated unassisted      Balance Screen   Has the patient fallen in the past 6 months No    Has the patient had a decrease in activity level because of a fear of falling?  No    Is the patient reluctant to leave their home because of a fear of falling?  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     Cognition   Overall Cognitive Status Within Functional Limits for tasks assessed   see cognitive assessment below for details     Visual Recognition/Discrimination   Discrimination Within Function Limits      Reading  Comprehension   Reading Status Within funtional limits      Written Expression   Dominant Hand Right      Oral Motor/Sensory Function   Overall Oral Motor/Sensory Function Appears within functional limits for tasks assessed      Motor Speech   Overall Motor Speech Appears within functional limits for tasks assessed      Standardized Assessments   Standardized Assessments  Boston Naming Test-41nd edition;Cognitive Linguistic Quick Test              OBJECTIVE:    COGNITION: Overall cognitive status: Within functional limits for tasks assessed Attention:WFL Memory: WFL Awareness: WFL Executive function:WFL Behavior: Within functional limits    COGNITIVE COMMUNICATION Commands: Follows multi-step commands consistently Auditory comprehension: WFL Verbal expression: WFL, Comments:brief hesitation in conversation x1 Functional communication:  WFL, Comments:reports dysnomia, hesitations a few times a day  ORAL MOTOR EXAMINATION  Facial : WFL  Lingual: WFL  Velum: WFL  Mandible: WFL  Cough: WFL  Voice: WFL    STANDARDIZED ASSESSMENTS:     Cognitive Linguistic Quick Test: AGE - 18 - 69   The Cognitive Linguistic Quick Test (CLQT) was administered to assess the relative status of five cognitive domains: attention, memory, language, executive functioning, and visuospatial skills. Scores from 10 tasks were used to estimate severity ratings (standardized for age groups 18-69 years and 70-89 years) for each domain, a clock drawing task, as well as an overall composite severity rating of cognition.       Task Score Criterion Cut Scores  Personal Facts 8/8 8  Symbol Cancellation 12/12 11  Confrontation Naming 10/10 10  Clock Drawing  10/13 12  Story Retelling 9/10 6  Symbol Trails 10/10 9  Generative Naming 9/9 5  Design Memory 4/6 5  Mazes  8/8 7  Design Generation 8/13 6    Cognitive Domain Composite Score Severity Rating  Attention 204/215 WNL  Memory 159/185 WNL  Executive Function 35/40 WNL  Language 36/37 WNL  Visuospatial Skills 92/105 WNL  Clock Drawing  10/13 Mild  Composite Severity Rating  WNL   Boston Naming Test (60 item)  Patient scored 54/60, which is less than 1 SD below the mean of 56.8 for age, with SD of 3.      SLP Education - 05/13/21 1301     Education Details neuroplasticity, cognitive communication discharge packet provided    Person(s) Educated Patient    Methods Explanation;Handout    Comprehension Verbalized understanding                  Plan - 05/13/21 1302     Clinical Impression Statement Debbie Bowen is presenting with cognitive-linguistic skills appearing within normal limits. Patient scored within normal range for age in domains of attention, memory, executive functions, language, and visuospatial skills per the Cognitive-Linguisitic Quick Test. She  scored 54/60, which within normal limits for age, on the 60-item Boston [employer]. Verbal fluency was a relative strength during assessment; with pt scoring highly on divergent naming tasks. Brief hesitation x1 noted in conversation, otherwise no verbal errors were apparent in conversation. During testing, patient made some errors due to attention (overlooking shapes in trailmaking, not focusing on designs in design memory task). She verbalized awareness of this, and on several instances, made self-corrections. Patient reports memory difficulties, which she attributes this to her ADD and reports she is using alarms/notetaking functionally at this time with minimal impacts on her day-to-day life. Education provided to patient on results  of her testing and that ADD can cause wordfinding difficulties. Education also provided on strategies to improve communication and reduce distractions. She has been referred for neuropsychological testing, and was encouraged to follow up with SLP should her work-up reveal suspicion for neurodegenerative process. No further skilled ST is indicated at this time.    Speech Therapy Frequency One time visit    Duration --   evaluation only   Treatment/Interventions Patient/family education;Compensatory strategies    SLP Home Exercise Plan cognitive discharge packet provided with strategies and education    Consulted and Agree with Plan of Care Patient             Patient will benefit from skilled therapeutic intervention in order to improve the following deficits and impairments:   Cognitive communication deficit    Problem List Patient Active Problem List   Diagnosis Date Noted   GAD (generalized anxiety disorder) 02/12/2021   Tattoo reaction 12/03/2020   Snoring 03/20/2020   Communication problem 03/20/2020   Chronic hip pain 11/07/2019   Facial pain 09/22/2019   GERD (gastroesophageal reflux disease) 09/14/2019   Palpitations 09/14/2019   Family history of  breast cancer gene mutation in first degree relative 04/07/2019   Dysmenorrhea 01/18/2018   Chronic bilateral low back pain without sciatica 06/05/2017   Dermatitis 05/19/2016   Perennial allergic rhinitis 05/19/2016   Encounter for annual general medical examination with abnormal findings in adult 12/31/2015   Hyperlipidemia 12/31/2015   Infertility, female 12/20/2015   ADHD (attention deficit hyperactivity disorder) 12/06/2015   Recurrent UTI 12/06/2015   Deneise Lever, [place], CCC-SLP Speech-Language Pathologist  Aliene Altes 05/13/2021, 1:19 PM  [place] [address] [place], [place], [address] Phone: [phone]   Fax:  [phone]  Name: Michiko Mandler MRN: [id number] Date of Birth: 1981/03/02

## 2021-05-20 ENCOUNTER — Ambulatory Visit: Payer: 59 | Admitting: Speech Pathology

## 2021-05-28 ENCOUNTER — Encounter: Payer: 59 | Admitting: Speech Pathology

## 2021-06-03 ENCOUNTER — Encounter: Payer: Self-pay | Admitting: Psychology

## 2021-06-03 ENCOUNTER — Encounter: Payer: 59 | Attending: Psychology | Admitting: Psychology

## 2021-06-03 ENCOUNTER — Other Ambulatory Visit: Payer: Self-pay

## 2021-06-03 DIAGNOSIS — F801 Expressive language disorder: Secondary | ICD-10-CM | POA: Diagnosis not present

## 2021-06-03 DIAGNOSIS — R413 Other amnesia: Secondary | ICD-10-CM

## 2021-06-03 DIAGNOSIS — F411 Generalized anxiety disorder: Secondary | ICD-10-CM

## 2021-06-03 DIAGNOSIS — G8929 Other chronic pain: Secondary | ICD-10-CM

## 2021-06-03 NOTE — Progress Notes (Addendum)
Neuropsychological Consultation   Patient:   Debbie Bowen   DOB:   November 29, 1980  MR Number:  [id number]  Location:  [facility] [place], [place] [id number] [place] [address] Dept: [phone]           Date of Service:   06/03/2021  Start Time:   2 PM End Time:   4 PM  Today's visit was an in person visit was conducted in my outpatient clinic office.  The patient myself were present.  1 hour and 15 minutes was spent in formal face-to-face clinical interview with the other 45 minutes spent in records review, report writing and setting up testing protocols.  Provider/Observer:  Ilean Skill, Psy.D.       Clinical Neuropsychologist       Billing Code/Service: 96116/[id number]  Chief Complaint:    Debbie Bowen is a 40 year old female referred by Jennings Books, MD who is her treating neurologist for neuropsychological evaluation due to reports of increasing and progressive memory changes and memory loss, changes in expressive language in a setting of prior history of anxiety, attentional deficits with previous diagnosis of ADD and some minor tremor.  Paraphasic errors are noted with examples of same door instead of window or other reversed words.  The patient reports that she is always had some degree of difficulty with memory but has progressively worsened over the past 2 to 3 years and she is having increasing word finding difficulties and difficulty remembering where she is placed things are actions that she is taken.  The patient reports that her anxiety "has been really bad lately" but has not been worse over the past month.  The patient reports that she has been worrying more and is unsure as what is causing her anxiety.  The patient also has a history of low vitamin B12 identified in 05/2020.  Patient had an MRI completed with and without contrast on 02/05/2021  that was interpreted as normal examination with no acute abnormality seen or indications that would explain her current symptoms.  Reason for Service:  Debbie Bowen is a 40 year old female referred by Jennings Books, MD who is her treating neurologist for neuropsychological evaluation due to reports of increasing and progressive memory changes and memory loss, changes in expressive language in a setting of prior history of anxiety, attentional deficits with previous diagnosis of ADD and some minor tremor.  Paraphasic errors are noted with examples of same door instead of window or other reversed words.  The patient reports that she is always had some degree of difficulty with memory but has progressively worsened over the past 2 to 3 years and she is having increasing word finding difficulties and difficulty remembering where she is placed things are actions that she is taken.  The patient reports that her anxiety "has been really bad lately" but has not been worse over the past month.  The patient reports that she has been worrying more and is unsure as what is causing her anxiety.  The patient also has a history of low vitamin B12 identified in 05/2020.  Patient had an MRI completed with and without contrast on 02/05/2021 that was interpreted as normal examination with no acute abnormality seen or indications that would explain her current symptoms.  While the patient denies any significant changes very recently she continues to describe occasional expressive language changes and intermittent periods of confusion.  No symptoms  consistent with seizures are noted.  The patient reports that she had a home sleep study conducted that was found to be within normal limits with no indication of obstructive sleep apnea.  Patient reports that she started noticing memory issues about 10 years ago but feels like she was never great with her memory.  The patient would have more difficulties remembering tasks but she  also had a lot of stress at various times.  The patient reports that cueing does help or has helped in the past but not as much now.  The patient reports that she will put something down and not remember doing it at all.  The patient reports that she was seen in the past by a psychologist but does not have that testing.  She reports that she initially saw someone in college because of her attention difficulties and feels like they thought she might have ADD but there was nothing done and no medication prescribed.  She later saw psychologist on an outpatient basis who diagnosed her with ADD and anxiety and the patient was tried on a number of different strengths of Adderall but never responded very well to them.  The patient also was tried briefly on Strattera and while the patient does not remember it specifically medical records suggest that she never finished the first month of Strattera and had side effects to it.  The patient reports that her side effects are on Adderall included significant sleep disturbance and the even tried to add Ambien to help with sleep.  The patient reports that she was also tried on Zoloft but Zoloft made her very jittery and described it as feeling like she had had too much coffee.  The patient reports that she tried CBD oils in the past and the first time she tried it it was helpful but apparently a change in formula and the second or third bottles did not help.  The patient denies any side effects to CBD oils.  The patient does continue to take vitamin B12.  Her last testing of her B12 was in the spring.  The patient denies any history of accidents with loss of consciousness and never had any seizures.  The patient denies any family history of progressive degenerative types of processes but does report that she has had family members who have been diagnosed with attentional deficits.  Reports that she has some mild shaking or tremor primarily with her right hand and describes a  quiver in her lower lip at times at rest.  The patient describes a very irregular sleep pattern.  She reports that for months at a time she will have significant difficulty falling asleep and difficulty staying asleep and then it will get better for a while.  The patient reports that even when she is sleeping through the night that she does not feel rested during the day.  The patient reports that she will take naps whenever she can and will take a nap that last between 1 and 1-1/2 hours at a time.  Patient reports that if she takes it hour to an hour and a half nap that she will feel very groggy for some time.  Patient describes her appetite is appropriate and has a good diet.  Overall, she reports that she has been gradually worsening over the past year.  Home sleep study conducted on 07/03/2020 did not indicate any significant sleep disordered breathing or other symptoms and treatment was not considered to be required.  The patient  denies any examples of exposures to toxic substance or working in environments with solvents heavy metals etc.  The patient reports that the home she lives and was constructed in the 70s and before they moved in there was extensive renovations done including a new roof.  There was some mild black mold present at the time but reports that it was effectively treated as far she knows.  Behavioral Observation: Debbie Bowen  presents as a 40 y.o.-year-old Right handed African American Female who appeared her stated age. her dress was Appropriate and she was Well Groomed and her manners were Appropriate to the situation.  her participation was indicative of Appropriate behaviors.  There were not physical disabilities noted.  she displayed an appropriate level of cooperation and motivation.     Interactions:    Active Appropriate  Attention:   abnormal and attention span and concentration were age appropriate  Memory:   within normal limits; recent and remote memory  intact  Visuo-spatial:  not examined  Speech (Volume):  normal  Speech:   normal; although the patient has reported paraphasic errors and word finding difficulties there were no clear communication deficits noted during the extended clinical interview.  Thought Process:  Coherent and Relevant  Though Content:  WNL; not suicidal and not homicidal  Orientation:   person, place, time/date, and situation  Judgment:   Good  Planning:   Fair  Affect:    Appropriate  Mood:    Anxious  Insight:   Good  Intelligence:   high  Marital Status/Living: The patient was born and raised in [place] along with 4 siblings.  The patient currently lives with her husband of 6 years and has no children.  Current Employment: The patient currently works as a Building surveyor and reports that work is going fine.  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  Hobbies and interests include traveling.  Substance Use:  No concerns of substance abuse are reported.  The patient has tried marijuana and CBD oil for her anxiety and stress but denies regular use.  Education:   The patient graduated from high school doing well in her academic classes and graduated with her bachelor's degree in biology from [employer].  Medical History:   Past Medical History:  Diagnosis Date   ADD (attention deficit disorder)    No medications   BV (bacterial vaginosis)    recurrent   Eczema    GERD (gastroesophageal reflux disease)    Insomnia    Orthodontics    invisalign   Scoliosis    no issues   Urticaria    UTI (lower urinary tract infection)    recurrent   Wears contact lenses          Patient Active Problem List   Diagnosis Date Noted   GAD (generalized anxiety disorder) 02/12/2021   Tattoo reaction 12/03/2020   Snoring 03/20/2020   Communication problem 03/20/2020   Chronic hip pain  11/07/2019   Facial pain 09/22/2019   GERD (gastroesophageal reflux disease) 09/14/2019   Palpitations 09/14/2019   Family history of breast cancer gene mutation in first degree relative 04/07/2019   Dysmenorrhea 01/18/2018   Chronic bilateral low back pain without sciatica 06/05/2017   Dermatitis 05/19/2016   Perennial allergic rhinitis 05/19/2016   Encounter for annual general medical examination with abnormal findings in adult 12/31/2015   Hyperlipidemia 12/31/2015   Infertility,  female 12/20/2015   ADHD (attention deficit hyperactivity disorder) 12/06/2015   Recurrent UTI 12/06/2015         Psychiatric History:  No significant prior psychiatric history noted with exception of some issues with anxiety and acknowledges anxiety going back through her life.  She does report there have been times where anxiety was more significant and has had therapy and counseling for this.  Patient has been tried on SSRI medications without success.  Family Med/Psych History:  Family History  Problem Relation Age of Onset   Diabetes Mother    Breast cancer Mother 77       Breast one side BRCA negative. Triple negative   Juvenile idiopathic arthritis Father    Eczema Father    Diabetes Maternal Aunt    Diabetes Paternal 62    Breast cancer Paternal Grandmother    Diabetes Paternal Grandmother    Asthma Sister    Asthma Brother    Diabetes Maternal Grandmother    Prostate cancer Maternal Grandfather    Prostate cancer Paternal Grandfather    Breast cancer Paternal Aunt 39   Breast cancer Paternal Aunt 32   Urticaria Neg Hx    Allergic rhinitis Neg Hx    Angioedema Neg Hx    Immunodeficiency Neg Hx     Impression/DX:  Caoilainn Kamorie Aldous is a 40 year old female referred by Jennings Books, MD who is her treating neurologist for neuropsychological evaluation due to reports of increasing and progressive memory changes and memory loss, changes in expressive language in a setting of prior  history of anxiety, attentional deficits with previous diagnosis of ADD and some minor tremor.  Paraphasic errors are noted with examples of same door instead of window or other reversed words.  The patient reports that she is always had some degree of difficulty with memory but has progressively worsened over the past 2 to 3 years and she is having increasing word finding difficulties and difficulty remembering where she is placed things are actions that she is taken.  The patient reports that her anxiety "has been really bad lately" but has not been worse over the past month.  The patient reports that she has been worrying more and is unsure as what is causing her anxiety.  The patient also has a history of low vitamin B12 identified in 05/2020.  Patient had an MRI completed with and without contrast on 02/05/2021 that was interpreted as normal examination with no acute abnormality seen or indications that would explain her current symptoms.  Disposition/Plan:  We have set the patient up for formal neuropsychological testing per neurologic referral.  The patient will complete testing on 2 separate days.  The patient completed the Wechsler Adult Intelligence Scale and Wechsler Memory Scale as well as in-depth analysis of attention and concentration executive functioning including the comprehensive attention battery and the CAB CPT measures.  We will also look at expressive language functioning measures as well.  Once this is completed a formal report will be made with diagnostic considerations and treatment recommendations going forward.  Sleep disturbance, anxiety symptoms, prior history of attentional deficits with previous diagnosis of ADD and progressive symptoms and worsening of memory functions are all noted.  Diagnosis:    Memory loss  Expressive language impairment  GAD (generalized anxiety disorder)  Other chronic pain         Electronically Signed   _______________________ Ilean Skill,  Psy.D. Clinical Neuropsychologist

## 2021-06-11 ENCOUNTER — Encounter: Payer: 59 | Admitting: Speech Pathology

## 2021-06-19 ENCOUNTER — Encounter: Payer: 59 | Admitting: Speech Pathology

## 2021-06-20 ENCOUNTER — Ambulatory Visit: Payer: 59 | Admitting: Primary Care

## 2021-06-25 ENCOUNTER — Other Ambulatory Visit: Payer: Self-pay

## 2021-06-25 ENCOUNTER — Encounter: Payer: 59 | Admitting: Speech Pathology

## 2021-06-25 DIAGNOSIS — R413 Other amnesia: Secondary | ICD-10-CM

## 2021-06-25 NOTE — Progress Notes (Signed)
   Behavioral Observation The patient appeared well-groomed and appropriately dressed for the session. Her manners were polite and appropriate. She demonstrated a positive attitude toward testing and showed good effort.  Neuropsychology Note  Debbie Bowen completed 60 minutes of neuropsychological testing with technician, Dina Rich, BA, under the supervision of Ilean Skill, PsyD., Clinical Neuropsychologist. The patient did not appear overtly distressed by the testing session, per behavioral observation or via self-report to the technician. Rest breaks were offered.   Clinical Decision Making: In considering the patient's current level of functioning, level of presumed impairment, nature of symptoms, emotional and behavioral responses during clinical interview, level of literacy, and observed level of motivation/effort, a battery of tests was selected by Dr. Sima Matas during initial consultation on 06/03/2021. This was communicated to the technician. Communication between the neuropsychologist and technician was ongoing throughout the testing session and changes were made as deemed necessary based on patient performance on testing, technician observations and additional pertinent factors such as those listed above.  Tests Administered: Comprehensive Attention Battery (CAB) Continuous Performance Test (CPT)  Results: Will be included in final report   Feedback to Patient: Debbie Bowen will return on 06/27/2021 for further testing and again on 11/26/2020 for an interactive feedback session with Dr. Sima Matas at which time her test performances, clinical impressions and treatment recommendations will be reviewed in detail. The patient understands she can contact our office should she require our assistance before this time.  60 minutes spent face-to-face with patient administering standardized tests, 30 minutes spent scoring Environmental education officer). [CPT [id number], 10301]  Full  report to follow.

## 2021-06-27 ENCOUNTER — Encounter (HOSPITAL_BASED_OUTPATIENT_CLINIC_OR_DEPARTMENT_OTHER): Payer: 59

## 2021-06-27 ENCOUNTER — Other Ambulatory Visit: Payer: Self-pay

## 2021-06-27 DIAGNOSIS — R413 Other amnesia: Secondary | ICD-10-CM | POA: Diagnosis not present

## 2021-06-27 NOTE — Progress Notes (Signed)
Behavioral Observation  The patient appeared well-groomed and appropriately dressed. Her manners were polite and appropriate to the situation. The patient had a positive attitude toward testing and showed good effort.  Neuropsychology Note  Debbie Bowen completed 210 minutes of neuropsychological testing with technician, Dina Rich, BA, under the supervision of Ilean Skill, PsyD., Clinical Neuropsychologist. The patient did not appear overtly distressed by the testing session, per behavioral observation or via self-report to the technician. Rest breaks were offered.   Clinical Decision Making: In considering the patient's current level of functioning, level of presumed impairment, nature of symptoms, emotional and behavioral responses during clinical interview, level of literacy, and observed level of motivation/effort, a battery of tests was selected by Dr. Sima Matas during initial consultation on 06/03/2021. This was communicated to the technician. Communication between the neuropsychologist and technician was ongoing throughout the testing session and changes were made as deemed necessary based on patient performance on testing, technician observations and additional pertinent factors such as those listed above.  Tests Administered: Controlled Oral Word Association Test (COWAT; FAS & Animals)  Wechsler Adult Intelligence Scale, 4th Edition (WAIS-IV) Wechsler Memory Scale, 4th Edition (WMS-IV); Adult Battery    Results:  WAIS-IV  Composite Score Summary  Scale Sum of Scaled Scores Composite Score Percentile Rank 95% Conf. Interval Qualitative Description  Verbal Comprehension 29 VCI 98 45 92-104 Average  Perceptual Reasoning 37 PRI 113 81 106-119 High Average  Working Memory 20 WMI 100 50 93-107 Average  Processing Speed 22 PSI 105 63 96-113 Average  Full Scale 108 FSIQ 105 63 101-109 Average  General Ability 66 GAI 105 63 100-110 Average      Verbal  Comprehension Subtests Summary  Subtest Raw Score Scaled Score Percentile Rank Reference Group Scaled Score SEM  Similarities 24 9 37 10 1.04  Vocabulary 38 10 50 11 0.73  Information 13 10 50 10 0.85  (Comprehension) 27 11 63 12 0.99       Perceptual Reasoning Subtests Summary  Subtest Raw Score Scaled Score Percentile Rank Reference Group Scaled Score SEM  Block Design 37 9 37 8 0.99  Matrix Reasoning 22 13 84 13 0.95  Visual Puzzles 22 15 95 14 1.04  (Figure Weights) 16 11 63 10 0.99  (Picture Completion) 13 10 50 9 1.20       Working Doctor, general practice Raw Score Scaled Score Percentile Rank Reference Group Scaled Score SEM  Digit Span 28 10 50 10 0.73  Arithmetic 15 10 50 11 0.99  (Letter-Number Seq.) 17 8 25 8  1.12       Processing Speed Subtests Summary  Subtest Raw Score Scaled Score Percentile Rank Reference Group Scaled Score SEM  Symbol Search 39 12 75 12 1.56  Coding 69 10 50 9 1.20  (Cancellation) 35 9 37 8 1.62      WMS-IV  Index Score Summary  Index Sum of Scaled Scores Index Score Percentile Rank 95% Confidence Interval Qualitative Descriptor  Auditory Memory (AMI) 29 84 14 79-91 Low Average  Visual Memory (VMI) 41 101 53 95-107 Average  Visual Working Memory (VWMI) 23 109 73 101-116 Average  Immediate Memory (IMI) 35 91 27 85-98 Average  Delayed Memory (DMI) 35 92 30 86-99 Average      Primary Subtest Scaled Score Summary  Subtest Domain Raw Score Scaled Score Percentile Rank  Logical Memory I AM 22 9 37  Logical Memory II AM 18 8 25   Verbal Paired Associates I AM  17 6 9   Verbal Paired Associates II AM 6 6 9   Designs I VM 75 11 63  Designs II VM 69 12 75  Visual Reproduction I VM 35 9 37  Visual Reproduction II VM 23 9 37  Spatial Addition VWM 16 11 63  Symbol Span VWM 30 12 75      Auditory Memory Process Score Summary  Process Score Raw Score Scaled Score Percentile Rank Cumulative Percentage (Base Rate)  LM  II Recognition 23 - - 26-50%  VPA II Recognition 38 - - 26-50%       Visual Memory Process Score Summary  Process Score Raw Score Scaled Score Percentile Rank Cumulative Percentage (Base Rate)  DE I Content 34 9 37 -  DE I Spatial 19 11 63 -  DE II Content 36 10 50 -  DE II Spatial 19 15 95 -  DE II Recognition 13 - - 17-25%  VR II Recognition 6 - - 51-75%      ABILITY-MEMORY ANALYSIS  Ability Score:  GAI: 105 Date of Testing:  WAIS-IV; WMS-IV 2021/06/27  Predicted Difference Method   Index Predicted WMS-IV Index Score Actual WMS-IV Index Score Difference Critical Value  Significant Difference Y/N Base Rate  Auditory Memory 103 84 19 8.54 Y 5-10%  Visual Memory 103 101 2 8.33 N   Visual Working Memory 103 109 -6 12.12 N   Immediate Memory 103 91 12 9.54 Y 15%  Delayed Memory 103 92 11 10.22 Y 20%  Statistical significance (critical value) at the .01 level.       COWAT FAS Total = 63 Z = 1.63 Animals Total = 1.13      Feedback to Patient: Debbie Bowen will return on 11/26/2021 for an interactive feedback session with Dr. Sima Matas at which time her test performances, clinical impressions and treatment recommendations will be reviewed in detail. The patient understands she can contact our office should she require our assistance before this time.  210 minutes spent face-to-face with patient administering standardized tests, 30 minutes spent scoring Environmental education officer). [CPT [id number], 56812]  Full report to follow.

## 2021-07-11 ENCOUNTER — Encounter: Payer: Self-pay | Admitting: Primary Care

## 2021-07-11 ENCOUNTER — Ambulatory Visit (INDEPENDENT_AMBULATORY_CARE_PROVIDER_SITE_OTHER): Payer: 59 | Admitting: Primary Care

## 2021-07-11 ENCOUNTER — Other Ambulatory Visit: Payer: Self-pay

## 2021-07-11 VITALS — BP 110/74 | HR 71 | Temp 97.6°F | Ht 66.0 in | Wt 145.0 lb

## 2021-07-11 DIAGNOSIS — M25541 Pain in joints of right hand: Secondary | ICD-10-CM | POA: Insufficient documentation

## 2021-07-11 HISTORY — DX: Pain in joints of right hand: M25.541

## 2021-07-11 LAB — C-REACTIVE PROTEIN: CRP: <1 mg/dL (ref 0.5–20.0)

## 2021-07-11 LAB — SEDIMENTATION RATE: Sed Rate: 35 mm/hr — ABNORMAL HIGH (ref 0–20)

## 2021-07-11 LAB — URIC ACID: Uric Acid, Serum: 2.9 mg/dL (ref 2.4–7.0)

## 2021-07-11 NOTE — Patient Instructions (Signed)
Stop by the lab prior to leaving today. I will notify you of your results once received.   Take ibuprofen 600 mg three times daily as needed for pain. Consider wearing brace nightly. Continue exercises online. Monitor for signs symptoms infection.   It was a pleasure to see you today!

## 2021-07-11 NOTE — Assessment & Plan Note (Addendum)
Suspect some form of arthritis, no signs of infection within the joint.   Checking labs today to evaluate for autoimmune arthritis or gout. She agrees.  Continue exercises. Discussed to wear brace at night.  Discussed use of NSAID's, ibuprofen 600 mg TID PRN.   Consider xray, she kindly declines today.  Await results.

## 2021-07-11 NOTE — Progress Notes (Signed)
Subjective:    Patient ID: Debbie Bowen, female    DOB: 02-22-1981, 40 y.o.   MRN: [id number]  HPI  Debbie Bowen is a very pleasant 40 y.o. female with a history of chronic low back pain, chronic hip pain who presents today to discuss hand pain.   Her pain is located to the right first metacarpal joint for which she first noted 6 weeks ago. Since then her pain has progressed and pain is now constant, waxes and wanes. She's tried a few exercises online, some improvement but temporary.   She denies joint swelling, redness, numbness, wrist pain, other finger pain. She has a family history of osteoarthritis, not sure if there is autoimmune arthritis.       Review of Systems  Musculoskeletal:  Positive for arthralgias.       Right 1st metacarpal joint pain  Skin:  Negative for color change and wound.  Neurological:  Negative for weakness and numbness.        Past Medical History:  Diagnosis Date   ADD (attention deficit disorder)    No medications   BV (bacterial vaginosis)    recurrent   Eczema    GERD (gastroesophageal reflux disease)    Insomnia    Orthodontics    invisalign   Scoliosis    no issues   Urticaria    UTI (lower urinary tract infection)    recurrent   Wears contact lenses     Social History   Socioeconomic History   Marital status: Married    Spouse name: Not on file   Number of children: Not on file   Years of education: 16   Highest education level: Not on file  Occupational History   Occupation: Sales promotion account executive    Comment: [employer]  Tobacco Use   Smoking status: Never   Smokeless tobacco: Never  Vaping Use   Vaping Use: Never used  Substance and Sexual Activity   Alcohol use: Yes    Alcohol/week: 2.0 standard drinks    Types: 2 Glasses of wine per week    Comment: occasionally   Drug use: No   Sexual activity: Yes    Partners: Male    Birth control/protection: None  Other Topics Concern   Not on  file  Social History Narrative   Single.    No children.   Self Employed.   Enjoys projects around her house.    Social Determinants of Health   Financial Resource Strain: Not on file  Food Insecurity: Not on file  Transportation Needs: Not on file  Physical Activity: Not on file  Stress: Not on file  Social Connections: Not on file  Intimate Partner Violence: Not on file    Past Surgical History:  Procedure Laterality Date   CERVIX LESION DESTRUCTION     DILATION AND CURETTAGE OF UTERUS     for TAB   ESOPHAGOGASTRODUODENOSCOPY (EGD) WITH PROPOFOL N/A 10/04/2015   Procedure: ESOPHAGOGASTRODUODENOSCOPY (EGD) WITH PROPOFOL;  Surgeon: Lucilla Lame, MD;  Location: [facility];  Service: Endoscopy;  Laterality: N/A;    Family History  Problem Relation Age of Onset   Diabetes Mother    Breast cancer Mother 98       Breast one side BRCA negative. Triple negative   Juvenile idiopathic arthritis Father    Eczema Father    Diabetes Maternal Aunt    Diabetes Paternal Aunt    Breast cancer Paternal Grandmother    Diabetes Paternal Grandmother  Asthma Sister    Asthma Brother    Diabetes Maternal Grandmother    Prostate cancer Maternal Grandfather    Prostate cancer Paternal Grandfather    Breast cancer Paternal Aunt 34   Breast cancer Paternal Aunt 8   Urticaria Neg Hx    Allergic rhinitis Neg Hx    Angioedema Neg Hx    Immunodeficiency Neg Hx     No Known Allergies  Current Outpatient Medications on File Prior to Visit  Medication Sig Dispense Refill   clobetasol ointment (TEMOVATE) 1.56 % Apply 1 application topically 2 (two) times daily.     clomiPHENE (CLOMID) 50 MG tablet Take by mouth.     ibuprofen (ADVIL) 200 MG tablet Take 200 mg by mouth as needed.     nitrofurantoin (MACRODANTIN) 50 MG capsule Take 1 capsule (50 mg total) by mouth as needed. For UTI prevention. 30 capsule 0   escitalopram (LEXAPRO) 10 MG tablet Take 1 tablet (10 mg total) by mouth  daily. For anxiety. (Patient not taking: Reported on 07/11/2021) 30 tablet 1   Prenatal Multivit-Min-Fe-FA (PRENATAL, W/IRON & FA,) 27-0.8 MG TABS Take 1 tablet by mouth daily.     No current facility-administered medications on file prior to visit.    BP 110/74   Pulse 71   Temp 97.6 F (36.4 C) (Temporal)   Ht _0  (1.676 m)   Wt 145 lb (65.8 kg)   SpO2 99%   BMI 23.40 kg/m  Objective:   Physical Exam Constitutional:      General: She is not in acute distress. Musculoskeletal:     Right hand: Swelling and tenderness present. No bony tenderness. Normal range of motion. Normal strength.     Comments: Mild swelling right 1st metacarpal joint through snuff box.  Skin:    General: Skin is warm and dry.     Findings: No erythema.  Neurological:     Comments: Negative Tinel's and Phalen's sign.          Assessment & Plan:      This visit occurred during the SARS-CoV-2 public health emergency.  Safety protocols were in place, including screening questions prior to the visit, additional usage of staff PPE, and extensive cleaning of exam room while observing appropriate contact time as indicated for disinfecting solutions.

## 2021-07-15 LAB — ANA: Anti Nuclear Antibody (ANA): NEGATIVE

## 2021-07-15 LAB — CYCLIC CITRUL PEPTIDE ANTIBODY, IGG: Cyclic Citrullin Peptide Ab: <16 UNITS

## 2021-07-15 LAB — RHEUMATOID FACTOR: Rheumatoid fact SerPl-aCnc: <14 IU/mL (ref <14)

## 2021-08-07 ENCOUNTER — Other Ambulatory Visit: Payer: Self-pay

## 2021-08-07 ENCOUNTER — Encounter: Payer: Self-pay | Admitting: Psychology

## 2021-08-07 ENCOUNTER — Encounter: Payer: 59 | Attending: Psychology | Admitting: Psychology

## 2021-08-07 DIAGNOSIS — F411 Generalized anxiety disorder: Secondary | ICD-10-CM | POA: Insufficient documentation

## 2021-08-07 DIAGNOSIS — R4189 Other symptoms and signs involving cognitive functions and awareness: Secondary | ICD-10-CM | POA: Insufficient documentation

## 2021-08-07 NOTE — Progress Notes (Signed)
Neuropsychological Evaluation   Patient:  Debbie Bowen   DOB: 03/09/1981  MR Number: [id number]  Location: [facility] [place] [facility] [place], [place] [id number] [place] [id number] Dept: [phone]  Start: 2 PM End: 3 PM  Provider/Observer:     Edgardo Roys PsyD  Chief Complaint:      Chief Complaint  Patient presents with   Memory Loss   Anxiety   Other    Complaints of word finding difficulties and verbal fluency changes with prior diagnosis of ADHD in the past    Reason For Service:     Debbie Bowen is a 40 year old female referred by Jennings Books, MD who is her treating neurologist for neuropsychological evaluation due to reports of increasing and progressive memory changes and memory loss, changes in expressive language in a setting of prior history of anxiety, attentional deficits with previous diagnosis of ADD and some minor tremor.  Paraphasic errors are noted with examples of saying door instead of window or other reversed words.  The patient reports that she has always had some degree of difficulty with memory but has progressively worsened over the past 2 to 3 years and she is having increasing word finding difficulties and difficulty remembering where she has placed things or actions that she has taken.  The patient reports that her anxiety "has been really bad lately" but has not been worse over the past month.  The patient reports that she has been worrying more and is unsure what is causing her anxiety.  The patient also has a history of low vitamin B12 identified in 05/2020.  Patient had an MRI completed with and without contrast on 02/05/2021 that was interpreted as normal examination with no acute abnormality seen or indications that would explain her current symptoms.  While the patient denies any significant changes very recently, she continues  to describe occasional expressive language changes and intermittent periods of confusion.  No symptoms consistent with seizures are noted.  The patient reports that she had a home sleep study conducted that was found to be within normal limits with no indication of obstructive sleep apnea.  Patient reports that she started noticing memory issues about 10 years ago but feels like she was never great with her memory.  The patient would have more difficulties remembering tasks but she also had a lot of stress at various times.  The patient reports that cueing does help or has helped in the past but not as much now.  The patient reports that she will put something down and not remember doing it at all.  The patient reports that she was seen in the past by a psychologist but does not have that testing.  She reports that she initially saw someone in college because of her attention difficulties and feels like they thought she might have ADD but there was nothing done and no medication prescribed.  She later saw psychologist on an outpatient basis who diagnosed her with ADD and anxiety and the patient was tried on a number of different strengths of Adderall but never responded very well to them.  The patient also was tried briefly on Strattera and while the patient does not remember it specifically medical records suggest that she never finished the first month of Strattera and had side effects to it.  The patient reports that her side effects are on Adderall included significant sleep disturbance and they  even tried to add Ambien to help with sleep.  The patient reports that she was also tried on Zoloft but Zoloft made her very jittery and described it as feeling like she had had too much coffee.  The patient reports that she tried CBD oils in the past and the first time she tried it it was helpful but apparently a change in formula and the second or third bottles did not help.  The patient denies any side effects to  CBD oils.  The patient does continue to take vitamin B12.  Her last testing of her B12 was in the spring.  The patient denies any history of accidents with loss of consciousness and never had any seizures.  The patient denies any family history of progressive degenerative types of processes but does report that she has had family members who have been diagnosed with attentional deficits.  Reports that she has some mild shaking or tremor primarily with her right hand and describes a quiver in her lower lip at times at rest.  The patient describes a very irregular sleep pattern.  She reports that for months at a time she will have significant difficulty falling asleep and difficulty staying asleep and then it will get better for a while.  The patient reports that even when she is sleeping through the night that she does not feel rested during the day.  The patient reports that she will take naps whenever she can and will take a nap that last between 1 and 1-1/2 hours at a time.  Patient reports that if she takes an hour to an hour and a half nap that she will feel very groggy for some time.  Patient describes her appetite as appropriate and has a good diet.  Overall, she reports that she has been gradually worsening over the past year.  Home sleep study conducted on 07/03/2020 did not indicate any significant sleep disordered breathing or other symptoms and treatment was not considered to be required.  The patient denies any examples of exposures to toxic substance or working in environments with solvents heavy metals etc.  The patient reports that the home she lives and was constructed in the 70s and before they moved in there was extensive renovations done including a new roof.  There was some mild black mold present at the time but reports that it was effectively treated as far she knows.  Tests Administered: Comprehensive Attention Battery (CAB) Continuous Performance Test (CPT) Controlled Oral Word  Association Test (COWAT; FAS & Animals)  Wechsler Adult Intelligence Scale, 4th Edition (WAIS-IV) Wechsler Memory Scale, 4th Edition (WMS-IV); Adult Battery    Participation Level:   Active  Participation Quality:  Appropriate      Behavioral Observation:  The patient appeared well-groomed and appropriately dressed for the session. Her manners were polite and appropriate. She demonstrated a positive attitude toward testing and showed good effort.  Well Groomed, Alert, and Appropriate.   Test Results:   Initially, an estimation was made as to historic/premorbid intellectual and cognitive functioning.  The patient graduated from high school as well as receiving her bachelor's degree in biology from the [employer] and has worked as a Engineer, materials clinical studies.  We will make a conservative estimation as to premorbid intellectual and cognitive functioning to be at least in the high average range with potentially some individual components to have been in the superior range.  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    COWAT FAS Total = 63 Z = 1.63 Animals Total = 1.13  The patient reports that she has been experiencing some changes in expressive language functioning and word finding capacity.  Due to the subjective reports she was administered the control oral Word Association test.  The patient did quite well on both measures of phonetic fluency as well as targeted naming trials.  She performed between 1 and 1-1/2 standard deviation above normative expectations with comparisons to age, gender and educational matched comparison groups.  There were no objective findings of word finding deficits and verbal fluency appeared to be appropriate during  clinical interview.   The patient was also administered the comprehensive attention battery and CAB CPT measures to provide a thorough and structured assessment of multiple attentional and executive functioning domains.  The patient initially completed the pure auditory and visual reaction time measures.  This assessed overall cortical arousal levels and abilities to process pure visual and pure auditory sensory information effectively.  On the pure visual reaction time measure she correctly identified 50 of 50 targets with 0 errors of omission and 0 errors of commission.  Average response time was 325 ms which is within normal limits.  On the pure auditory reaction time test she correctly identified 50 of 50 targets with 0 errors of omission and 0 errors of commission.  Average response time was 340 ms which is also within normal limits.  On the discriminate reaction time test measures the patient correctly identified 35 of 35 targets on the visual discriminate reaction time measure with 0 errors of omission and 0 errors of omission.  Average response time was 413 ms which was within normal limits.  On the auditory discriminate reaction time measure she correctly identified 35 of 35 targets with 0 errors of commission and 0 errors of omission.  Her average response time was 592 ms which is also within normative expectations.  On the mixed/shift discriminate reaction time measure she correctly identified 30 of 30 targets and had 2 errors of commission and 0 errors of omission.  This is an excellent score.  The patient required 599 ms for her average response time which is within normal limits.  The patient was effectively able to shift between varying target stimuli and show excellent ability to inhibit inappropriate responses and good focus execute abilities.  On the auditory/visual scan reaction time measure the patient showed consistent and excellent performance as she has on other measures.  On the  visual, auditory and mixed scan reaction time measure she I will essentially achieve nearly 100% accuracy with only 1 error of commission and 1 error of omission on the 3 measures combined.  All of these were within normal limits with regard to accuracy scores.  As on other measures her average response times on all 3 of these measures were also well within normal limits and there was no indication of deficits with auditory processing or visual processing or more complex information.  On the auditory/visual encoding test, the patient did very well relative to normative expectations.  She did particularly well on the auditory backwards subtest as well as the visual encoding measures.  There were no indications of auditory or visual encoding deficits.  On the Stroop interference cancellation measure which starts out in a nontargeted interference/9  interference task requiring focus execute abilities the patient did quite well and by the fourth 9 interference trial she was correctly identifying 16 of 18 targets and 15 seconds.  This measure then quickly shifts into a targeted interference/Stroop effect trials.  The patient correctly identified 15 of 18 targets in the first interference trial suggesting deficient ability to refrain from targeted distraction.  She continued this good performance across the measures and there were no indications of any deficits with regard to increased distractibility or deficits with focus execute abilities.  Finally, the patient was administered the visual monitor continuous performance measure.  This is a 15-minute discriminate reaction time test that is divided into five 3-minute blocks of time for analysis.  As she had done on the much shorter discriminate reaction time measures earlier her accuracy scores were very good and consistent across all 15 minutes of this measure.  She had 2 total errors of commission and 1 total errors of omission across the entire 15 minutes of the  task.  During the first 3 minutes of the task she required 330 ms for her average response time.  This performance continues to be quite stable over time and by the last 3 minutes of this 50-minute test she was averaging 362 ms which is a very good score and shows consistent ability to maintain effective sustained attention and sustained accuracy across the entire 15 minutes of the task.  There were no indications of any deficits with regard to sustaining attention.  WAIS-IV             Composite Score Summary        Scale Sum of Scaled Scores Composite Score Percentile Rank 95% Conf. Interval Qualitative Description  Verbal Comprehension 29 VCI 98 45 92-104 Average  Perceptual Reasoning 37 PRI 113 81 106-119 High Average  Working Memory 20 WMI 100 50 93-107 Average  Processing Speed 22 PSI 105 63 96-113 Average  Full Scale 108 FSIQ 105 63 101-109 Average  General Ability 66 GAI 105 63 100-110 Average    The patient was then administered the Wechsler Adult Intelligence Scale-IV to provide an objective assessment of a broad range of intellectual and cognitive functioning.  As the patient is describing changes over the past several years regarding memory and cognitive functioning the achieved score should not be used as indicative for descriptive of her lifelong intellectual and cognitive functioning but rather description of her current functioning status.  The patient produced a full-scale IQ score of 105 which falls at the 63rd percentile and is in the average range relative to a normative population.  This is slightly below predicted levels based on her education and occupational history.  We also calculated the patient's general abilities index score which places less emphasis on working memory and information processing speed as those are potential for most acute change.  The patient produced a general abilities index score of 105 which also falls at the 63rd percentile and is in the average  range.  There was some degree of composite score variability with the patient did very well on measures of perceptual reasoning and visual reasoning and problem-solving capacity.  She performed in the average range on verbal comprehension skills, which is below premorbid estimates but still in the average range.  Auditory encoding was within normal limits and her information processing speed was also within normal limits.             Verbal Comprehension Subtests Summary  Subtest Raw Score Scaled Score Percentile Rank Reference Group Scaled Score SEM  Similarities 24 9 37 10 1.04  Vocabulary 38 10 50 11 0.73  Information 13 10 50 10 0.85  (Comprehension) 27 11 63 12 0.99      The patient produced a verbal comprehension index score of 98 which falls at the 45th percentile and is in the average range.  This is approximately 1 standard deviation below estimates of premorbid functioning.  The patient performed in the average range on all subtests including verbal reasoning and problem-solving capacity, vocabulary knowledge, general fund of information and measures of social judgment comprehension.             Perceptual Reasoning Subtests Summary      Subtest Raw Score Scaled Score Percentile Rank Reference Group Scaled Score SEM  Block Design 37 9 37 8 0.99  Matrix Reasoning 22 13 84 13 0.95  Visual Puzzles 22 15 95 14 1.04  (Figure Weights) 16 11 63 10 0.99  (Picture Completion) 13 10 50 9 1.20      The patient produced a perceptual reasoning index score of 113 which falls at the 81st percentile and is in the high average range relative to normative population.  This is consistent with premorbid estimations.  There was some variability within subtest performance.  The patient did very well on measures of visual reasoning and problem-solving, visual estimation and prediction capacity.  She performed in the average range with regard to visual analysis and organizational skills, visual  estimation and prediction and her ability to identify visual anomalies within a visual gestalt.  There were no areas of significant impairments noted in the subtest.             Working Memory Subtests Summary      Subtest Raw Score Scaled Score Percentile Rank Reference Group Scaled Score SEM  Digit Span 28 10 50 10 0.73  Arithmetic 15 10 50 11 0.99  (Letter-Number Seq.) 17 8 25 8  1.12      The patient produced a working memory index score of 100 which falls at the 50th percentile and is in the average range relative to normative population.  The patient performed in the average range with regard to auditory encoding capacity and her ability to process information actively that is being maintained in active registers.  The patient showed some mild reduction in performance when asked to manipulate and sequence information and active registries suggesting some difficulties with shifting attention.             Processing Speed Subtests Summary      Subtest Raw Score Scaled Score Percentile Rank Reference Group Scaled Score SEM  Symbol Search 39 12 75 12 1.56  Coding 69 10 50 9 1.20  (Cancellation) 35 9 37 8 1.62    The patient produced a processing speed index score of 105 which falls at the 63rd percentile and is in the average range.  This is only slightly below predicted levels.  There was some variability in subtest performance with all measures falling in the average range to upper end of the average range.  The patient did well on visual scanning, visual searching measures as well as overall speed of mental operations.     WMS-IV           Index Score Summary       Index Sum of Scaled Scores Index Score Percentile Rank 95% Confidence Interval Financial planner Memory (  AMI) 29 36 14 79-91 Low Average  Visual Memory (VMI) 41 101 53 95-107 Average  Visual Working Memory (VWMI) 23 109 73 101-116 Average  Immediate Memory (IMI) 35 91 27 85-98 Average  Delayed Memory  (DMI) 35 92 30 86-99 Average      The patient then completed the Weschler memory scale-IV to provide a structured assessment of multiple areas of memory and learning.  On the Wechsler Adult Intelligence Scale the patient performed in the average range on measures of auditory encoding and she did very well on auditory encoding measures on the comprehensive attention battery.  Similar performance was noted on this measure with regard to visual working memory and visual encoding where she produced an index score of 109 and function in the 73rd percentile.  This is essentially the same type of performance for visual encoding noted on the comprehensive attention battery.  Therefore there were no indications of any encoding deficits and her encoding capacity should allow for effective and efficient storage and organization and memory functions.  Break in the patient's memory functions down between auditory versus visual memory the patient produced an auditory memory index score of 84 which fell at the 14th percentile and is in the low average range.  This is significantly below premorbid estimates and also significantly below her visual working memory achieved on this particular measure.  The patient produced a visual working memory index score of 101 which falls at the 53rd percentile and is in the average range and only slightly below predicted levels of premorbid functioning.  However, looking at individual subtest making up this auditory working memory the patient's weaknesses are primarily related to one particular type of task (verbal paired associates) and she did generally well on story learning capacity.  This 1 measure was also the primary and sole component bringing down her overall immediate memory and delayed memory scores with all other subtests generally in the average range and consistent with predicted levels are at most only slightly below premorbid functioning.  The patient produced an immediate  memory index score of 91 which falls at the 27th percentile and a delayed memory index score of 92 which falls the 30th percentile.  However, these measures were almost single-handedly brought down from the average range because of 1 individual subtest that the patient had significant difficulty.  The patient also did well on recognition/cued recall formats suggesting that she is able to effectively store and organize new information.  While her performance is below predicted levels and consistent with the patient's reports of subjective memory difficulties they are almost single-handedly affected by 1 individual subtest.  Auditory memory is not quite as efficient as visual memory and learning but is still not indicative of any significant objective finding of memory deficits.          Primary Subtest Scaled Score Summary     Subtest Domain Raw Score Scaled Score Percentile Rank  Logical Memory I AM 22 9 37  Logical Memory II AM 18 8 25   Verbal Paired Associates I AM 17 6 9   Verbal Paired Associates II AM 6 6 9   Designs I VM 75 11 63  Designs II VM 69 12 75  Visual Reproduction I VM 35 9 37  Visual Reproduction II VM 23 9 37  Spatial Addition VWM 16 11 63  Symbol Span VWM 30 12 75                Auditory Memory Process Score Summary  Process Score Raw Score Scaled Score Percentile Rank Cumulative Percentage (Base Rate)  LM II Recognition 23 - - 26-50%  VPA II Recognition 38 - - 26-50%                   Visual Memory Process Score Summary      Process Score Raw Score Scaled Score Percentile Rank Cumulative Percentage (Base Rate)  DE I Content 34 9 37 -  DE I Spatial 19 11 63 -  DE II Content 36 10 50 -  DE II Spatial 19 15 95 -  DE II Recognition 13 - - 17-25%  VR II Recognition 6 - - 51-75%      Summary of Results:   Overall, the results of the current neuropsychological evaluation show very good expressive language functioning particularly around verbal fluency for both  phonetic as well as targeted naming.  There were no indications of expressive language deficits.  The patient's attentional capacity was quite good across the board.  The patient showed very good abilities with regard to focus execute abilities, sustaining attention, auditory and visual encoding capacity, freedom from distractibility, effective ability to shift attention between environmental stimuli as well as the ability to inhibit inappropriate or impulsive responses.  There were no consistent areas of cognitive dysfunction and with the exception of 1 specific memory task (verbal paired Associates) all cognitive areas including all memory test were within normal limits and at most only slightly below premorbid predictions.  Auditory and visual encoding, executive functioning including visual and verbal reasoning and problem-solving abilities, visual-spatial and visual constructional abilities and information processing speeds were all within normal limits.  Impression/Diagnosis:   Overall, the results of the current neuropsychological evaluation are quite encouraging.  While the patient is describing consistent difficulties with memory and learning and executive functioning she reports that she is adequately functioning in her workplace setting.  The patient showed no objective findings of consistent cognitive deficits and in fact she did very well on of number of different attention and concentration variables and did very well on sustained attentional measures.  To that effect there is no indication of any adult residual attention deficit disorder either.  I do not find any indication of early onset progressive dementia or other types of neurologically mediated cognitive deficits.  The patient has had low B12 noted roughly 1 year ago but she has been taking B12 supplements since then.  The patient does have a history of significant anxiety and a history of significant irregular sleep patterns.  The patient  had a recent home sleep apnea test that did not indicate any indications of obstructive sleep apnea.  The patient is taking Lexapro without apparent side effects although she did have difficulty in the past with psychotropic medications including Zoloft, Adderall and Strattera.  Her difficulties with Strattera and Adderall are not surprising giving the current results and I would suspect Strattera had a significant deleterious effect on her anxiety and the Adderall is reported as having a significant negative impact on her sleep without improvement.  Again, the patient is not showing any objective findings of adult residual attention deficit disorder or significant cognitive deficits.  I suspect the patient is likely very hypervigilant Of her difficulties and in certain situations this is likely contributing to increased word finding difficulties.  I will sit down with the patient and go over the results of the current neuropsychological evaluation.  I suspect that anxiety and sleep difficulties are the primary culprits  with growing hypervigilance for any errors that are made.  Diagnosis:    GAD (generalized anxiety disorder)  Subjective memory complaints   _____________________ Ilean Skill, Psy.D. Clinical Neuropsychologist

## 2021-08-16 ENCOUNTER — Ambulatory Visit: Payer: 59 | Admitting: Primary Care

## 2021-10-01 ENCOUNTER — Other Ambulatory Visit: Payer: Self-pay | Admitting: Primary Care

## 2021-10-01 DIAGNOSIS — N390 Urinary tract infection, site not specified: Secondary | ICD-10-CM

## 2021-10-01 MED ORDER — NITROFURANTOIN MACROCRYSTAL 50 MG PO CAPS: 50.0000 mg | ORAL_CAPSULE | ORAL | 0 refills | Status: DC | PRN

## 2021-10-03 ENCOUNTER — Encounter: Payer: BC Managed Care – PPO | Attending: Psychology | Admitting: Psychology

## 2021-10-03 DIAGNOSIS — R4189 Other symptoms and signs involving cognitive functions and awareness: Secondary | ICD-10-CM | POA: Insufficient documentation

## 2021-10-03 DIAGNOSIS — F411 Generalized anxiety disorder: Secondary | ICD-10-CM | POA: Insufficient documentation

## 2021-10-23 ENCOUNTER — Encounter: Payer: BC Managed Care – PPO | Admitting: Psychology

## 2021-10-23 ENCOUNTER — Other Ambulatory Visit: Payer: Self-pay

## 2021-10-23 DIAGNOSIS — R4189 Other symptoms and signs involving cognitive functions and awareness: Secondary | ICD-10-CM | POA: Diagnosis not present

## 2021-10-23 DIAGNOSIS — F411 Generalized anxiety disorder: Secondary | ICD-10-CM | POA: Diagnosis not present

## 2021-10-24 NOTE — Progress Notes (Signed)
10/23/2021 3 PM-4 PM:  Today's visit was conducted in my outpatient clinic office with the patient myself present.  It was an in person visit.  Today I provided feedback regarding the results of the recent neuropsychological evaluation that was done at the behest of her neurologist Dr. Brigitte Pulse.  We reviewed the results of the recent neuropsychological evaluation and talked about the specific performance on a wide range of neuropsychological test measures and how the decisions and recommendations were developed.  I will provide a copy of the summary and impressions below for convenience in the patient's full report can be found in her EMR dated 08/07/2021.    Summary of Results:                        Overall, the results of the current neuropsychological evaluation show very good expressive language functioning particularly around verbal fluency for both phonetic as well as targeted naming.  There were no indications of expressive language deficits.  The patient's attentional capacity was quite good across the board.  The patient showed very good abilities with regard to focus execute abilities, sustaining attention, auditory and visual encoding capacity, freedom from distractibility, effective ability to shift attention between environmental stimuli as well as the ability to inhibit inappropriate or impulsive responses.  There were no consistent areas of cognitive dysfunction and with the exception of 1 specific memory task (verbal paired Associates) all cognitive areas including all memory test were within normal limits and at most only slightly below premorbid predictions.  Auditory and visual encoding, executive functioning including visual and verbal reasoning and problem-solving abilities, visual-spatial and visual constructional abilities and information processing speeds were all within normal limits.   Impression/Diagnosis:                     Overall, the results of the current neuropsychological  evaluation are quite encouraging.  While the patient is describing consistent difficulties with memory and learning and executive functioning she reports that she is adequately functioning in her workplace setting.  The patient showed no objective findings of consistent cognitive deficits and in fact she did very well on of number of different attention and concentration variables and did very well on sustained attentional measures.  To that effect there is no indication of any adult residual attention deficit disorder either.  I do not find any indication of early onset progressive dementia or other types of neurologically mediated cognitive deficits.  The patient has had low B12 noted roughly 1 year ago but she has been taking B12 supplements since then.  The patient does have a history of significant anxiety and a history of significant irregular sleep patterns.  The patient had a recent home sleep apnea test that did not indicate any indications of obstructive sleep apnea.  The patient is taking Lexapro without apparent side effects although she did have difficulty in the past with psychotropic medications including Zoloft, Adderall and Strattera.  Her difficulties with Strattera and Adderall are not surprising giving the current results and I would suspect Strattera had a significant deleterious effect on her anxiety and the Adderall is reported as having a significant negative impact on her sleep without improvement.  Again, the patient is not showing any objective findings of adult residual attention deficit disorder or significant cognitive deficits.  I suspect the patient is likely very hypervigilant Of her difficulties and in certain situations this is likely contributing to increased word finding difficulties.  I will sit down with the patient and go over the results of the current neuropsychological evaluation.  I suspect that anxiety and sleep difficulties are the primary culprits with growing  hypervigilance for any errors that are made.   Diagnosis:                                GAD (generalized anxiety disorder)   Subjective memory complaints     _____________________ Ilean Skill, Psy.D. Clinical Neuropsychologist

## 2021-11-13 ENCOUNTER — Ambulatory Visit: Payer: 59 | Admitting: Psychology

## 2021-11-26 ENCOUNTER — Ambulatory Visit: Payer: 59 | Admitting: Psychology

## 2021-12-18 ENCOUNTER — Other Ambulatory Visit: Payer: Self-pay | Admitting: Primary Care

## 2021-12-19 ENCOUNTER — Telehealth: Payer: BC Managed Care – PPO | Admitting: Primary Care

## 2021-12-23 ENCOUNTER — Telehealth: Payer: Self-pay | Admitting: Primary Care

## 2021-12-23 NOTE — Telephone Encounter (Signed)
Pt called to resched appointment form 4/20 for vv to discuss labs for another pcp. No openings until 5/12.  ? ?Pt stated that is too long. Please advise.  ?

## 2021-12-24 ENCOUNTER — Other Ambulatory Visit: Payer: BC Managed Care – PPO

## 2021-12-25 NOTE — Telephone Encounter (Signed)
Spoke to patient by telephone and was advised that she is going to [place] for an IVF procedure 02/07/22. Patient stated that she needs some lab work done prior to going for the procedure and has a list of what is needed. Patient stated that she would like to discuss this with Anda Kraft. Patient scheduled for an appointment with Allie Bossier NP 01/15/22 at 11:40 am. ?

## 2021-12-25 NOTE — Telephone Encounter (Signed)
Noted  

## 2021-12-31 ENCOUNTER — Telehealth: Payer: Self-pay

## 2021-12-31 NOTE — Telephone Encounter (Signed)
Patient was advised from provider that she is going to for IVF in [place] that she needs to be on birthcontrol. She has to start no later than tomorrow. You do not have anything open. Patient wanted to see if you would call in one month for her without visit.  ?

## 2022-01-01 NOTE — Telephone Encounter (Signed)
Called and spoke with pt she is started IVF and wanted see if she could an appt today with Anda Kraft to start birth control  I told her she  didn't have any opening , however she did have an opening  for next week, pt was agreeable to this appt. Pt want to know if she could possible get started on birth control today , I told her per Anda Kraft note she will need to seen first before she get started on anything.  ?

## 2022-01-01 NOTE — Telephone Encounter (Addendum)
Patient will need an office visit for this request. ?We will see her as scheduled. We can try to add her in sooner if needed but I cannot grant her request without a visit and she gave [place] a one day notice. ?

## 2022-01-08 ENCOUNTER — Encounter: Payer: Self-pay | Admitting: Primary Care

## 2022-01-08 ENCOUNTER — Ambulatory Visit: Payer: BC Managed Care – PPO | Admitting: Primary Care

## 2022-01-08 DIAGNOSIS — N979 Female infertility, unspecified: Secondary | ICD-10-CM | POA: Diagnosis not present

## 2022-01-08 DIAGNOSIS — N946 Dysmenorrhea, unspecified: Secondary | ICD-10-CM

## 2022-01-08 DIAGNOSIS — R1013 Epigastric pain: Secondary | ICD-10-CM | POA: Diagnosis not present

## 2022-01-08 MED ORDER — OMEPRAZOLE 20 MG PO CPDR: 20.0000 mg | DELAYED_RELEASE_CAPSULE | Freq: Every day | ORAL | 0 refills | Status: DC

## 2022-01-08 NOTE — Assessment & Plan Note (Addendum)
Following with [place] came and fertility center in [place]. ? ?Agree to obtain labs that are requested, also discussed that patient is responsible for delivering labs to her provider in [place]. ? ?Agree to provide 1 month of monophasic birth control.  Urine pregnancy test pending.  Rx for Junel 1/20 micrograms sent to pharmacy once urine pregnancy test returns.  ? ? ?

## 2022-01-08 NOTE — Progress Notes (Signed)
? ?Subjective:  ? ? Patient ID: Debbie Bowen, female    DOB: [birth year], 41 y.o.   MRN: [id number] ? ?HPI ? ?Debbie Bowen is a very pleasant 41 y.o. female with a history of GERD, recurrent UTI, infertility, dysmenorrhea, hyperlipidemia, chronic back pain who presents today requesting lab work for IVF treatment. She would also like to discuss GI symptoms.  ? ?1) Infertility: Currently following with a fertility clinic in [place] at [facility]. She and her husband have not prevented pregnancy for the last 6 years as she's been off birth control. She was following with fertility services for in the [place] for IUI in March 2023, was told that her AMH was reduced and too low for IUI.  ? ?She is now proceeding with IVF in [place].She will undergo IVF soon and is needing a lot of lab work completed. She is also needing monophasic birth control for one month.  ? ?Last pap smear was in August 2020 which was negative.  ? ? ?2) Epigastric Pain: Acute for the last one week for which she describes as a gnawing pain. Originally constant, now more intermittent since she began omeprazole 20 mg daily.  ? ?She denies rectal bleeding, nausea, vomiting. She takes 800 mg of motrin every 6 hours for 4 to 5 days during menstrual cycles.  Her symptoms began after she was on Motrin and ate a spicy to continue which. ? ?She has been taking omeprazole for the last several days and has noticed improvement in symptoms. ? ?Review of Systems  ?Gastrointestinal:  Positive for abdominal pain. Negative for blood in stool, nausea and vomiting.  ?Genitourinary:  Positive for menstrual problem.  ? ?   ? ? ?Past Medical History:  ?Diagnosis Date  ? ADD (attention deficit disorder)   ? No medications  ? BV (bacterial vaginosis)   ? recurrent  ? Eczema   ? GERD (gastroesophageal reflux disease)   ? Insomnia   ? Orthodontics   ? invisalign  ? Scoliosis   ? no issues  ? Urticaria   ? UTI (lower urinary  tract infection)   ? recurrent  ? Wears contact lenses   ? ? ?Social History  ? ?Socioeconomic History  ? Marital status: Married  ?  Spouse name: Not on file  ? Number of children: Not on file  ? Years of education: 73  ? Highest education level: Not on file  ?Occupational History  ? Occupation: Sales promotion account executive  ?  Comment: [employer]  ?Tobacco Use  ? Smoking status: Never  ? Smokeless tobacco: Never  ?Vaping Use  ? Vaping Use: Never used  ?Substance and Sexual Activity  ? Alcohol use: Yes  ?  Alcohol/week: 2.0 standard drinks  ?  Types: 2 Glasses of wine per week  ?  Comment: occasionally  ? Drug use: No  ? Sexual activity: Yes  ?  Partners: Male  ?  Birth control/protection: None  ?Other Topics Concern  ? Not on file  ?Social History Narrative  ? Single.   ? No children.  ? Self Employed.  ? Enjoys projects around her house.   ? ?Social Determinants of Health  ? ?Financial Resource Strain: Not on file  ?Food Insecurity: Not on file  ?Transportation Needs: Not on file  ?Physical Activity: Not on file  ?Stress: Not on file  ?Social Connections: Not on file  ?Intimate Partner Violence: Not on file  ? ? ?Past Surgical History:  ?Procedure Laterality  Date  ? CERVIX LESION DESTRUCTION    ? DILATION AND CURETTAGE OF UTERUS    ? for TAB  ? ESOPHAGOGASTRODUODENOSCOPY (EGD) WITH PROPOFOL N/A 10/04/2015  ? Procedure: ESOPHAGOGASTRODUODENOSCOPY (EGD) WITH PROPOFOL;  Surgeon: Lucilla Lame, MD;  Location: [facility];  Service: Endoscopy;  Laterality: N/A;  ? ? ?Family History  ?Problem Relation Age of Onset  ? Diabetes Mother   ? Breast cancer Mother 59  ?     Breast one side BRCA negative. Triple negative  ? Juvenile idiopathic arthritis Father   ? Eczema Father   ? Diabetes Maternal Aunt   ? Diabetes Paternal Aunt   ? Breast cancer Paternal Grandmother   ? Diabetes Paternal Grandmother   ? Asthma Sister   ? Asthma Brother   ? Diabetes Maternal Grandmother   ? Prostate cancer Maternal Grandfather   ? Prostate  cancer Paternal Grandfather   ? Breast cancer Paternal Aunt 41  ? Breast cancer Paternal Aunt 52  ? Urticaria Neg Hx   ? Allergic rhinitis Neg Hx   ? Angioedema Neg Hx   ? Immunodeficiency Neg Hx   ? ? ?No Known Allergies ? ?Current Outpatient Medications on File Prior to Visit  ?Medication Sig Dispense Refill  ? ibuprofen (ADVIL) 200 MG tablet Take 200 mg by mouth as needed.    ? nitrofurantoin (MACRODANTIN) 50 MG capsule Take 1 capsule (50 mg total) by mouth as needed. For UTI prevention. 30 capsule 0  ? Prenatal Multivit-Min-Fe-FA (PRENATAL, W/IRON & FA,) 27-0.8 MG TABS Take 1 tablet by mouth daily.    ? ?No current facility-administered medications on file prior to visit.  ? ? ?BP 108/64   Pulse 87   Temp 98.5 ?F (36.9 ?C) (Oral)   Ht 5' 6"  (1.676 m)   Wt 142 lb (64.4 kg)   SpO2 99%   BMI 22.92 kg/m?  ?Objective:  ? Physical Exam ?Cardiovascular:  ?   Rate and Rhythm: Normal rate and regular rhythm.  ?Pulmonary:  ?   Effort: Pulmonary effort is normal.  ?   Breath sounds: Normal breath sounds.  ?Abdominal:  ?   General: Bowel sounds are normal.  ?   Palpations: Abdomen is soft.  ?   Tenderness: There is no abdominal tenderness.  ?Musculoskeletal:  ?   Cervical back: Neck supple.  ?Skin: ?   General: Skin is warm and dry.  ? ? ? ? ? ?   ?Assessment & Plan:  ? ? ? ? ?This visit occurred during the SARS-CoV-2 public health emergency.  Safety protocols were in place, including screening questions prior to the visit, additional usage of staff PPE, and extensive cleaning of exam room while observing appropriate contact time as indicated for disinfecting solutions.  ?

## 2022-01-08 NOTE — Assessment & Plan Note (Signed)
Symptoms suspicious for GERD.  We will also consider H. pylori, especially if symptoms return. ? ?Continue omeprazole 20 mg daily.  We discussed that she needs to take omeprazole anytime she is taking Motrin.  Strongly advise she reduce use of Motrin in general. ? ?We also discussed food triggers of GERD. ? ?She will update if no continued improvement. ?

## 2022-01-08 NOTE — Assessment & Plan Note (Signed)
Advised against such recurrent use of Motrin for symptoms.  We also discussed that if she is going to take Motrin that she needs to take omeprazole alongside. ?

## 2022-01-08 NOTE — Patient Instructions (Signed)
Stop by the lab prior to leaving today. I will notify you of your results once received.   It was a pleasure to see you today!  

## 2022-01-09 LAB — CBC WITH DIFFERENTIAL/PLATELET
Basophils Absolute: 0.1 10*3/uL (ref 0.0–0.1)
Basophils Relative: 1.2 % (ref 0.0–3.0)
Eosinophils Absolute: 0 10*3/uL (ref 0.0–0.7)
Eosinophils Relative: 0.5 % (ref 0.0–5.0)
HCT: 38.9 % (ref 36.0–46.0)
Hemoglobin: 12.7 g/dL (ref 12.0–15.0)
Lymphocytes Relative: 36 % (ref 12.0–46.0)
Lymphs Abs: 1.5 10*3/uL (ref 0.7–4.0)
MCHC: 32.6 g/dL (ref 30.0–36.0)
MCV: 83.8 fl (ref 78.0–100.0)
Monocytes Absolute: 0.2 10*3/uL (ref 0.1–1.0)
Monocytes Relative: 5.8 % (ref 3.0–12.0)
Neutro Abs: 2.4 10*3/uL (ref 1.4–7.7)
Neutrophils Relative %: 56.5 % (ref 43.0–77.0)
Platelets: 290 10*3/uL (ref 150.0–400.0)
RBC: 4.65 Mil/uL (ref 3.87–5.11)
RDW: 13.9 % (ref 11.5–15.5)
WBC: 4.2 10*3/uL (ref 4.0–10.5)

## 2022-01-09 LAB — FOLLICLE STIMULATING HORMONE: FSH: 2.7 m[IU]/mL

## 2022-01-09 LAB — HEMOGLOBIN A1C: Hgb A1c MFr Bld: 5.7 % (ref 4.6–6.5)

## 2022-01-09 LAB — LUTEINIZING HORMONE: LH: 4.42 m[IU]/mL

## 2022-01-09 LAB — TSH: TSH: 1.38 u[IU]/mL (ref 0.35–5.50)

## 2022-01-09 LAB — HCG, QUANTITATIVE, PREGNANCY: Quantitative HCG: <0.6 m[IU]/mL

## 2022-01-09 LAB — TESTOSTERONE: Testosterone: 6.83 ng/dL — ABNORMAL LOW (ref 15.00–40.00)

## 2022-01-09 LAB — VITAMIN D 25 HYDROXY (VIT D DEFICIENCY, FRACTURES): VITD: 15.41 ng/mL — ABNORMAL LOW (ref 30.00–100.00)

## 2022-01-10 ENCOUNTER — Other Ambulatory Visit: Payer: Self-pay | Admitting: Primary Care

## 2022-01-10 DIAGNOSIS — N979 Female infertility, unspecified: Secondary | ICD-10-CM

## 2022-01-10 MED ORDER — NORETHINDRONE ACET-ETHINYL EST 1-20 MG-MCG PO TABS: 1.0000 | ORAL_TABLET | Freq: Every day | ORAL | 0 refills | Status: DC

## 2022-01-15 ENCOUNTER — Ambulatory Visit: Payer: BC Managed Care – PPO | Admitting: Primary Care

## 2022-01-15 LAB — ANTI-MULLERIAN HORMONE (AMH), FEMALE: Anti-Mullerian Hormones(AMH), Female: 1.51 ng/mL (ref 0.01–2.99)

## 2022-01-15 LAB — HEPATITIS C ANTIBODY
Hepatitis C Ab: NONREACTIVE
SIGNAL TO CUT-OFF: 0.26 (ref <1.00)

## 2022-01-15 LAB — ANTI-MICROSOMAL ANTIBODY LIVER / KIDNEY: LKM1 Ab: <=20 U (ref <=20.0)

## 2022-01-15 LAB — ABO AND RH

## 2022-01-15 LAB — CMV ABS, IGG+IGM (CYTOMEGALOVIRUS)
CMV IgM: <30 AU/mL
Cytomegalovirus Ab-IgG: <0.6 U/mL

## 2022-01-15 LAB — RPR: RPR Ser Ql: NONREACTIVE

## 2022-01-15 LAB — THYROGLOBULIN LEVEL: Thyroglobulin: 10.1 ng/mL

## 2022-01-15 LAB — MEASLES/MUMPS/RUBELLA IMMUNITY
Mumps IgG: 129 AU/mL
Rubella: 5.35 Index
Rubeola IgG: 77.5 AU/mL

## 2022-01-15 LAB — PROLACTIN: Prolactin: 10.9 ng/mL

## 2022-01-15 LAB — PROGESTERONE: Progesterone: <0.5 ng/mL

## 2022-01-15 LAB — HEPATITIS B SURFACE ANTIGEN: Hepatitis B Surface Ag: NONREACTIVE

## 2022-01-15 LAB — HIV-2 DNA/RNA, QUALITATIVE, REAL-TIME PCR: HIV 2 DNA/RNA QUALITATIVE REAL TIME PCR: NOT DETECTED

## 2022-01-15 LAB — ESTRADIOL: Estradiol: 136 pg/mL

## 2022-01-15 LAB — HIV ANTIBODY (ROUTINE TESTING W REFLEX): HIV 1&2 Ab, 4th Generation: NONREACTIVE

## 2022-01-21 LAB — ANTIPHOSPHOLIPID SYNDROME COMP
APTT: 26.4 s
Anticardiolipin Ab, IgA: <10 [APL'U]
Anticardiolipin Ab, IgG: <10 [GPL'U]
Anticardiolipin Ab, IgM: <10 [MPL'U]
Antiphosphatidylserine IgG: 1 {GPS'U}
Antiphosphatidylserine IgM: 0 {MPS'U}
Antiprothrombin Antibody, IgG: 6 G units
Beta-2 Glycoprotein I, IgA: <10 SAU
Beta-2 Glycoprotein I, IgG: <10 SGU
Beta-2 Glycoprotein I, IgM: <10 SMU
DRVVT Screen Seconds: 34.6 s
Hexagonal Phospholipid Neutral: 8 s
Platelet Neutralization: 0 s

## 2022-01-23 ENCOUNTER — Encounter: Payer: Self-pay | Admitting: Primary Care

## 2022-01-23 ENCOUNTER — Ambulatory Visit: Payer: BC Managed Care – PPO | Admitting: Primary Care

## 2022-01-23 DIAGNOSIS — J069 Acute upper respiratory infection, unspecified: Secondary | ICD-10-CM | POA: Insufficient documentation

## 2022-01-23 NOTE — Progress Notes (Signed)
Subjective:    Patient ID: Debbie Bowen, female    DOB: 06/08/1981, 41 y.o.   MRN: [id number]  HPI  Debbie Bowen is a very pleasant 41 y.o. female with a history of allergic rhinitis, GERD, recurrent UTI who presents today to discuss otalgia.  Symptom onset three days ago with sore throat. She then developed right ear pain, sneezing, fatigue, rhinorrhea, and cough.   She is running low grade fevers at 100. She tested negative for Covid-19 yesterday. Her husband doesn't have any symptoms.   She's taken Mucinex and Tylenol and Cold with some improvement.     Review of Systems  Constitutional:  Positive for fatigue and fever.  HENT:  Positive for ear pain, postnasal drip, rhinorrhea and sore throat.   Respiratory:  Positive for cough. Negative for shortness of breath.         Past Medical History:  Diagnosis Date   ADD (attention deficit disorder)    No medications   BV (bacterial vaginosis)    recurrent   Eczema    GERD (gastroesophageal reflux disease)    Insomnia    Orthodontics    invisalign   Scoliosis    no issues   Urticaria    UTI (lower urinary tract infection)    recurrent   Wears contact lenses     Social History   Socioeconomic History   Marital status: Married    Spouse name: Not on file   Number of children: Not on file   Years of education: 16   Highest education level: Not on file  Occupational History   Occupation: Sales promotion account executive    Comment: [employer]  Tobacco Use   Smoking status: Never   Smokeless tobacco: Never  Vaping Use   Vaping Use: Never used  Substance and Sexual Activity   Alcohol use: Yes    Alcohol/week: 2.0 standard drinks    Types: 2 Glasses of wine per week    Comment: occasionally   Drug use: No   Sexual activity: Yes    Partners: Male    Birth control/protection: None  Other Topics Concern   Not on file  Social History Narrative   Single.    No children.   Self Employed.    Enjoys projects around her house.    Social Determinants of Health   Financial Resource Strain: Not on file  Food Insecurity: Not on file  Transportation Needs: Not on file  Physical Activity: Not on file  Stress: Not on file  Social Connections: Not on file  Intimate Partner Violence: Not on file    Past Surgical History:  Procedure Laterality Date   CERVIX LESION DESTRUCTION     DILATION AND CURETTAGE OF UTERUS     for TAB   ESOPHAGOGASTRODUODENOSCOPY (EGD) WITH PROPOFOL N/A 10/04/2015   Procedure: ESOPHAGOGASTRODUODENOSCOPY (EGD) WITH PROPOFOL;  Surgeon: Lucilla Lame, MD;  Location: [facility];  Service: Endoscopy;  Laterality: N/A;    Family History  Problem Relation Age of Onset   Diabetes Mother    Breast cancer Mother 30       Breast one side BRCA negative. Triple negative   Juvenile idiopathic arthritis Father    Eczema Father    Diabetes Maternal Aunt    Diabetes Paternal Aunt    Breast cancer Paternal Grandmother    Diabetes Paternal Grandmother    Asthma Sister    Asthma Brother    Diabetes Maternal Grandmother    Prostate cancer Maternal  Grandfather    Prostate cancer Paternal Grandfather    Breast cancer Paternal Aunt 65   Breast cancer Paternal Aunt 81   Urticaria Neg Hx    Allergic rhinitis Neg Hx    Angioedema Neg Hx    Immunodeficiency Neg Hx     No Known Allergies  Current Outpatient Medications on File Prior to Visit  Medication Sig Dispense Refill   ibuprofen (ADVIL) 200 MG tablet Take 200 mg by mouth as needed.     nitrofurantoin (MACRODANTIN) 50 MG capsule Take 1 capsule (50 mg total) by mouth as needed. For UTI prevention. 30 capsule 0   norethindrone-ethinyl estradiol (JUNEL 1/20) 1-20 MG-MCG tablet Take 1 tablet by mouth daily. 28 tablet 0   omeprazole (PRILOSEC) 20 MG capsule Take 1 capsule (20 mg total) by mouth daily. For heartburn. 90 capsule 0   Prenatal Multivit-Min-Fe-FA (PRENATAL, W/IRON & FA,) 27-0.8 MG TABS Take 1 tablet  by mouth daily.     No current facility-administered medications on file prior to visit.    BP 108/62   Pulse 62   Temp 98.6 F (37 C) (Oral)   Ht _0  (1.676 m)   Wt 140 lb (63.5 kg)   SpO2 96%   BMI 22.60 kg/m  Objective:   Physical Exam HENT:     Right Ear: Ear canal normal. Tympanic membrane is bulging. Tympanic membrane is not erythematous.     Left Ear: Tympanic membrane and ear canal normal. Tympanic membrane is not erythematous or bulging.     Nose:     Right Sinus: No maxillary sinus tenderness or frontal sinus tenderness.     Left Sinus: No maxillary sinus tenderness or frontal sinus tenderness.     Mouth/Throat:     Pharynx: No posterior oropharyngeal erythema.  Eyes:     Conjunctiva/sclera: Conjunctivae normal.  Cardiovascular:     Rate and Rhythm: Normal rate and regular rhythm.  Pulmonary:     Effort: Pulmonary effort is normal.     Breath sounds: Normal breath sounds. No wheezing or rales.  Musculoskeletal:     Cervical back: Neck supple.  Lymphadenopathy:     Cervical: No cervical adenopathy.  Skin:    General: Skin is warm and dry.          Assessment & Plan:

## 2022-01-23 NOTE — Assessment & Plan Note (Signed)
Symptoms represent viral vs allergy etiology. Exam today reassuring.  Continue Mucinex, Tylenol Cold. Add Flonase nasal spray BID.  Return precautions provided.

## 2022-01-23 NOTE — Patient Instructions (Signed)
Nasal Congestion/Ear Pressure/Sinus Pressure: Try using Flonase (fluticasone) nasal spray. Instill 1 spray in each nostril twice daily.   Continue Mucinex and Tylenol as needed.  Please message me Wednesday next week if no improvement.  It was a pleasure to see you today!

## 2022-02-03 NOTE — Telephone Encounter (Signed)
I spoke with pt; pt was seen on 01/23/22 and pt said she was feeling better on 01/28/22. On 01/29/22 started with dry cough,no SOB or wheezing.some loss of appetite; runny nose and fatigue. Pt said she has not had any fever and no CP but pt has a "funny feeling in upper stomach that she cannot describe.". there is no pain but maybe achiness in upper abd. Slight nausea but no vomiting or diarrhea. Pt has already scheduled appt with Anda Kraft on 02/07/22 with Allie Bossier NP. Pt is presently in [place] on 02/04/22 at 10 AM. UC & ED precautions given and pt voiced understanding. Sending note to Gentry Fitz NP and [facility] CMA.

## 2022-02-04 ENCOUNTER — Ambulatory Visit: Payer: BC Managed Care – PPO | Admitting: Primary Care

## 2022-02-04 NOTE — Telephone Encounter (Signed)
Noted, will evaluate. 

## 2022-02-07 ENCOUNTER — Encounter: Payer: Self-pay | Admitting: Primary Care

## 2022-02-07 ENCOUNTER — Ambulatory Visit: Payer: BC Managed Care – PPO | Admitting: Primary Care

## 2022-02-07 DIAGNOSIS — R109 Unspecified abdominal pain: Secondary | ICD-10-CM | POA: Diagnosis not present

## 2022-02-07 NOTE — Progress Notes (Signed)
Subjective:    Patient ID: Debbie Bowen, female    DOB: 1981/08/04, 41 y.o.   MRN: [id number]  Gastroesophageal Reflux She complains of abdominal pain. She reports no nausea.    Debbie Bowen is a very pleasant 41 y.o. female with a history of GERD, hyperlipidemia, chronic low back pain, palpitations, GAD who presents today to discuss   Her symptoms originally began one week ago to the bilateral upper abdomen which was not a pain, but a "sensation" with decreased appetite. Four days ago the upper abdominal sensation abated and she developed pain under the left lower ribcage. Her left ribcage pain was a constant, sharp pain initially. Over the last several days have been less frequent.   She's taken Tums and Prilosec with improvement. Her symptoms resolved last night. She did experience her typical GERD symptoms last week while traveling to see her sister in [place].   She denies constipation, diarrhea, nausea/vomiting, fatigue, chills, fevers. Symptoms did not worsen with food intake. She traveled to [place] and [place] in March 2023.  She takes Ibuprofen 600 mg to 800 mg for five consecutive days each month for menstrual cramping. She has done this for the last 3 years. She will take Prilosec with each dose of Ibuprofen.    Review of Systems  Constitutional:  Negative for chills and fever.  Gastrointestinal:  Positive for abdominal pain. Negative for blood in stool, constipation, diarrhea, nausea and vomiting.         Past Medical History:  Diagnosis Date   ADD (attention deficit disorder)    No medications   BV (bacterial vaginosis)    recurrent   Eczema    GERD (gastroesophageal reflux disease)    Insomnia    Orthodontics    invisalign   Scoliosis    no issues   Urticaria    UTI (lower urinary tract infection)    recurrent   Wears contact lenses     Social History   Socioeconomic History   Marital status: Married    Spouse name: Not on file    Number of children: Not on file   Years of education: 16   Highest education level: Not on file  Occupational History   Occupation: Sales promotion account executive    Comment: [employer]  Tobacco Use   Smoking status: Never   Smokeless tobacco: Never  Vaping Use   Vaping Use: Never used  Substance and Sexual Activity   Alcohol use: Yes    Alcohol/week: 2.0 standard drinks of alcohol    Types: 2 Glasses of wine per week    Comment: occasionally   Drug use: No   Sexual activity: Yes    Partners: Male    Birth control/protection: None  Other Topics Concern   Not on file  Social History Narrative   Single.    No children.   Self Employed.   Enjoys projects around her house.    Social Determinants of Health   Financial Resource Strain: Not on file  Food Insecurity: Not on file  Transportation Needs: Not on file  Physical Activity: Not on file  Stress: Not on file  Social Connections: Not on file  Intimate Partner Violence: Not on file    Past Surgical History:  Procedure Laterality Date   CERVIX LESION DESTRUCTION     DILATION AND CURETTAGE OF UTERUS     for TAB   ESOPHAGOGASTRODUODENOSCOPY (EGD) WITH PROPOFOL N/A 10/04/2015   Procedure: ESOPHAGOGASTRODUODENOSCOPY (EGD) WITH PROPOFOL;  Surgeon:  Lucilla Lame, MD;  Location: [facility];  Service: Endoscopy;  Laterality: N/A;    Family History  Problem Relation Age of Onset   Diabetes Mother    Breast cancer Mother 40       Breast one side BRCA negative. Triple negative   Juvenile idiopathic arthritis Father    Eczema Father    Diabetes Maternal Aunt    Diabetes Paternal 27    Breast cancer Paternal Grandmother    Diabetes Paternal Grandmother    Asthma Sister    Asthma Brother    Diabetes Maternal Grandmother    Prostate cancer Maternal Grandfather    Prostate cancer Paternal Grandfather    Breast cancer Paternal Aunt 21   Breast cancer Paternal Aunt 64   Urticaria Neg Hx    Allergic rhinitis Neg Hx     Angioedema Neg Hx    Immunodeficiency Neg Hx     No Known Allergies  Current Outpatient Medications on File Prior to Visit  Medication Sig Dispense Refill   ibuprofen (ADVIL) 200 MG tablet Take 200 mg by mouth as needed.     nitrofurantoin (MACRODANTIN) 50 MG capsule Take 1 capsule (50 mg total) by mouth as needed. For UTI prevention. 30 capsule 0   norethindrone-ethinyl estradiol (JUNEL 1/20) 1-20 MG-MCG tablet Take 1 tablet by mouth daily. 28 tablet 0   omeprazole (PRILOSEC) 20 MG capsule Take 1 capsule (20 mg total) by mouth daily. For heartburn. 90 capsule 0   Prenatal Multivit-Min-Fe-FA (PRENATAL, W/IRON & FA,) 27-0.8 MG TABS Take 1 tablet by mouth daily.     No current facility-administered medications on file prior to visit.    BP 110/62   Pulse 72   Temp 98.2 F (36.8 C) (Oral)   Ht _0  (1.676 m)   Wt 142 lb (64.4 kg)   SpO2 99%   BMI 22.92 kg/m  Objective:   Physical Exam Cardiovascular:     Rate and Rhythm: Normal rate.  Pulmonary:     Effort: Pulmonary effort is normal.  Abdominal:     General: Bowel sounds are normal.     Palpations: Abdomen is soft.  Musculoskeletal:     Cervical back: Neck supple.  Skin:    General: Skin is warm and dry.           Assessment & Plan:   Problem List Items Addressed This Visit   None      Pleas Koch, NP

## 2022-02-07 NOTE — Patient Instructions (Signed)
Reduce use of Ibuprofen. Try to alternate with Tylenol.   Avoid irritative/acidic/spicy food for the next few days.   It was a pleasure to see you today!

## 2022-02-07 NOTE — Assessment & Plan Note (Signed)
Unclear etiology. Differentials include GERD, PUD, H pylori.  Discussed to reduce NSAID use, alternate with Tylenol. Also discussed to try Pepcid if symptoms return so that we can test for H pylori.   Exam today benign. Symptoms resolved last night.  Return precautions provided.

## 2022-04-10 ENCOUNTER — Encounter: Payer: BC Managed Care – PPO | Admitting: Primary Care

## 2022-04-17 ENCOUNTER — Ambulatory Visit (INDEPENDENT_AMBULATORY_CARE_PROVIDER_SITE_OTHER): Payer: Commercial Managed Care - HMO | Admitting: Primary Care

## 2022-04-17 ENCOUNTER — Encounter: Payer: Self-pay | Admitting: Primary Care

## 2022-04-17 ENCOUNTER — Other Ambulatory Visit (HOSPITAL_COMMUNITY)
Admission: RE | Admit: 2022-04-17 | Discharge: 2022-04-17 | Disposition: A | Discharge status: Home / Self Care | Payer: Commercial Managed Care - HMO | Source: Ambulatory Visit | Attending: Primary Care | Admitting: Primary Care

## 2022-04-17 VITALS — BP 118/74 | HR 87 | Temp 98.1°F | Ht 66.0 in | Wt 140.0 lb

## 2022-04-17 DIAGNOSIS — Z124 Encounter for screening for malignant neoplasm of cervix: Secondary | ICD-10-CM

## 2022-04-17 DIAGNOSIS — E538 Deficiency of other specified B group vitamins: Secondary | ICD-10-CM

## 2022-04-17 DIAGNOSIS — N898 Other specified noninflammatory disorders of vagina: Secondary | ICD-10-CM

## 2022-04-17 DIAGNOSIS — Z0001 Encounter for general adult medical examination with abnormal findings: Secondary | ICD-10-CM | POA: Diagnosis not present

## 2022-04-17 DIAGNOSIS — N390 Urinary tract infection, site not specified: Secondary | ICD-10-CM | POA: Diagnosis not present

## 2022-04-17 DIAGNOSIS — K219 Gastro-esophageal reflux disease without esophagitis: Secondary | ICD-10-CM

## 2022-04-17 DIAGNOSIS — Z23 Encounter for immunization: Secondary | ICD-10-CM

## 2022-04-17 DIAGNOSIS — N946 Dysmenorrhea, unspecified: Secondary | ICD-10-CM | POA: Diagnosis not present

## 2022-04-17 DIAGNOSIS — E559 Vitamin D deficiency, unspecified: Secondary | ICD-10-CM

## 2022-04-17 DIAGNOSIS — Z1231 Encounter for screening mammogram for malignant neoplasm of breast: Secondary | ICD-10-CM

## 2022-04-17 DIAGNOSIS — Z8481 Family history of carrier of genetic disease: Secondary | ICD-10-CM

## 2022-04-17 DIAGNOSIS — N979 Female infertility, unspecified: Secondary | ICD-10-CM | POA: Diagnosis not present

## 2022-04-17 DIAGNOSIS — R5383 Other fatigue: Secondary | ICD-10-CM

## 2022-04-17 NOTE — Assessment & Plan Note (Addendum)
Referral placed to endocrinology per patient request. Also discussed the likelihood that they likely may not except this referral and to start considering an infertility specialist.  I evaluated patient, was consulted regarding treatment, and agree with assessment and plan per Tinnie Gens, RN, DNP student.   Allie Bossier, NP-C

## 2022-04-17 NOTE — Assessment & Plan Note (Addendum)
Intermittent. Overall controlled.  Continue to avoid triggers.  I evaluated patient, was consulted regarding treatment, and agree with assessment and plan per Tinnie Gens, RN, DNP student.   Allie Bossier, NP-C

## 2022-04-17 NOTE — Patient Instructions (Signed)
Referral for endocrinologist has been sent and you should hear back in two weeks.   Referral for your mammogram and ultrasound has been sent to [facility].   Stop by the lab prior to leaving today. I will notify you of your results once received.    It was a pleasure to see you today!

## 2022-04-17 NOTE — Assessment & Plan Note (Signed)
Mammogram due, orders placed.

## 2022-04-17 NOTE — Assessment & Plan Note (Addendum)
Controlled. Continue Nitrofurantoin 50 mg as needed.

## 2022-04-17 NOTE — Progress Notes (Signed)
Subjective:    Patient ID: Debbie Bowen, female    DOB: 1981/08/14, 41 y.o.   MRN: [id number]  Vaginal Discharge The patient's primary symptoms include vaginal discharge. Pertinent negatives include no constipation, diarrhea, headaches or rash.    Debbie Bowen is a very pleasant 41 y.o. female who presents today for complete physical and follow up of chronic conditions.  She would also like to discuss several issues.  1) Infertility: She was pending IVF in [place], decided not to go through with this. She is requesting a referral to endocrinology to check "hormone levels".   Previously following at [facility], she underwent IUI twice which did not take. It was recommended she undergo IVF but they did not wish to pursue. She does not see a fertility specialist in the [place].   2) Vaginal Odor: Acute for the last four days. She has her typical amount of vaginal discharge which is clear, denies increased discharge. She denies itching, dysuria, lower abdominal pain. She's noticed irritation in her underwear. She's not tried anything OTC.   3) Fatigue: Acute for the last two months, wakes up feeling un rested. She sleeps about 7-8 hours nightly, doesn't wake during the night for the most part. She finds her self wanting to nap during the day, will nap 2-3 times weekly for about 45-60 minutes. She denies difficulty falling asleep. She underwent a sleep study in 2021 or 2022 which was negative. She denies mind racing thoughts or increased anxiety or depression.   Immunizations: -Tetanus: 2013 -Influenza: Declines today -Covid-19: 2 vaccines   Diet: Fair diet.  Exercise: Regular exercise.  Eye exam: Completes annually  Dental exam: Completes semi-annually   Pap Smear: Completed in August 2020 Mammogram: Completed in 2021, scheduled for later this month.  BP Readings from Last 3 Encounters:  04/17/22 118/74  02/07/22 110/62  01/23/22 108/62          Review of Systems  Constitutional:  Negative for unexpected weight change.  HENT:  Negative for rhinorrhea.   Respiratory:  Negative for cough and shortness of breath.   Cardiovascular:  Negative for chest pain.  Gastrointestinal:  Negative for constipation and diarrhea.  Genitourinary:  Positive for vaginal discharge. Negative for difficulty urinating.  Musculoskeletal:  Negative for arthralgias and myalgias.  Skin:  Negative for rash.  Allergic/Immunologic: Negative for environmental allergies.  Neurological:  Negative for dizziness, numbness and headaches.         Past Medical History:  Diagnosis Date   ADD (attention deficit disorder)    No medications   BV (bacterial vaginosis)    recurrent   Eczema    GERD (gastroesophageal reflux disease)    Insomnia    Orthodontics    invisalign   Scoliosis    no issues   Urticaria    UTI (lower urinary tract infection)    recurrent   Wears contact lenses     Social History   Socioeconomic History   Marital status: Married    Spouse name: Not on file   Number of children: Not on file   Years of education: 16   Highest education level: Not on file  Occupational History   Occupation: Sales promotion account executive    Comment: [employer]  Tobacco Use   Smoking status: Never   Smokeless tobacco: Never  Vaping Use   Vaping Use: Never used  Substance and Sexual Activity   Alcohol use: Yes    Alcohol/week: 2.0 standard drinks of alcohol  Types: 2 Glasses of wine per week    Comment: occasionally   Drug use: No   Sexual activity: Yes    Partners: Male    Birth control/protection: None  Other Topics Concern   Not on file  Social History Narrative   Single.    No children.   Self Employed.   Enjoys projects around her house.    Social Determinants of Health   Financial Resource Strain: Not on file  Food Insecurity: Not on file  Transportation Needs: Not on file  Physical Activity: Not on file   Stress: Not on file  Social Connections: Not on file  Intimate Partner Violence: Not on file    Past Surgical History:  Procedure Laterality Date   CERVIX LESION DESTRUCTION     DILATION AND CURETTAGE OF UTERUS     for TAB   ESOPHAGOGASTRODUODENOSCOPY (EGD) WITH PROPOFOL N/A 10/04/2015   Procedure: ESOPHAGOGASTRODUODENOSCOPY (EGD) WITH PROPOFOL;  Surgeon: Lucilla Lame, MD;  Location: [facility];  Service: Endoscopy;  Laterality: N/A;    Family History  Problem Relation Age of Onset   Diabetes Mother    Breast cancer Mother 62       Breast one side BRCA negative. Triple negative   Juvenile idiopathic arthritis Father    Eczema Father    Diabetes Maternal Aunt    Diabetes Paternal 30    Breast cancer Paternal Grandmother    Diabetes Paternal Grandmother    Asthma Sister    Asthma Brother    Diabetes Maternal Grandmother    Prostate cancer Maternal Grandfather    Prostate cancer Paternal Grandfather    Breast cancer Paternal Aunt 42   Breast cancer Paternal Aunt 63   Urticaria Neg Hx    Allergic rhinitis Neg Hx    Angioedema Neg Hx    Immunodeficiency Neg Hx     No Known Allergies  Current Outpatient Medications on File Prior to Visit  Medication Sig Dispense Refill   nitrofurantoin (MACRODANTIN) 50 MG capsule Take 1 capsule (50 mg total) by mouth as needed. For UTI prevention. 30 capsule 0   Prenatal Multivit-Min-Fe-FA (PRENATAL, W/IRON & FA,) 27-0.8 MG TABS Take 1 tablet by mouth daily.     ibuprofen (ADVIL) 200 MG tablet Take 200 mg by mouth as needed. (Patient not taking: Reported on 04/17/2022)     No current facility-administered medications on file prior to visit.    BP 118/74   Pulse 87   Temp 98.1 F (36.7 C) (Oral)   Ht _0  (1.676 m)   Wt 140 lb (63.5 kg)   LMP 03/22/2022   SpO2 99%   BMI 22.60 kg/m  Objective:   Physical Exam HENT:     Right Ear: Tympanic membrane and ear canal normal.     Left Ear: Tympanic membrane and ear canal  normal.     Nose: Nose normal.  Eyes:     Conjunctiva/sclera: Conjunctivae normal.     Pupils: Pupils are equal, round, and reactive to light.  Neck:     Thyroid: No thyromegaly.  Cardiovascular:     Rate and Rhythm: Normal rate and regular rhythm.     Heart sounds: No murmur heard. Pulmonary:     Effort: Pulmonary effort is normal.     Breath sounds: Normal breath sounds. No rales.  Abdominal:     General: Bowel sounds are normal.     Palpations: Abdomen is soft.     Tenderness: There is no abdominal  tenderness.  Genitourinary:    Labia:        Right: No tenderness or lesion.        Left: No tenderness or lesion.      Vagina: Vaginal discharge present.     Cervix: No cervical motion tenderness or discharge.     Uterus: Normal.      Adnexa: Right adnexa normal and left adnexa normal.     Comments: Scant amount of dark brown discharge Musculoskeletal:        General: Normal range of motion.     Cervical back: Neck supple.  Lymphadenopathy:     Cervical: No cervical adenopathy.  Skin:    General: Skin is warm and dry.     Findings: No rash.  Neurological:     Mental Status: She is alert and oriented to person, place, and time.     Cranial Nerves: No cranial nerve deficit.     Deep Tendon Reflexes: Reflexes are normal and symmetric.  Psychiatric:        Mood and Affect: Mood normal.           Assessment & Plan:   Problem List Items Addressed This Visit       Digestive   GERD (gastroesophageal reflux disease)    Intermittent. Overall controlled.  Continue to avoid triggers.  I evaluated patient, was consulted regarding treatment, and agree with assessment and plan per Tinnie Gens, RN, DNP student.   Allie Bossier, NP-C         Genitourinary   Recurrent UTI    Controlled. Continue Nitrofurantoin 50 mg as needed.      Infertility, female    Referral placed to endocrinology per patient request. Also discussed the likelihood that they likely may not  except this referral and to start considering an infertility specialist.  I evaluated patient, was consulted regarding treatment, and agree with assessment and plan per Tinnie Gens, RN, DNP student.   Allie Bossier, NP-C       Relevant Orders   Ambulatory referral to Endocrinology   Dysmenorrhea    LMP 03/22/22.   Cramping has subsided.        Other   Encounter for annual general medical examination with abnormal findings in adult - Primary    TDAP given today.   PAP smear done today.  Mammogram referral sent.   Discussed the importance of eating healthy diet and exercise.   Labs pending.  I evaluated patient, was consulted regarding treatment, and agree with assessment and plan per Tinnie Gens, RN, DNP student.   Allie Bossier, NP-C'      Family history of breast cancer gene mutation in first degree relative    Mammogram due, orders placed.      Fatigue    Checking labs today to rule out metabolic cause. Screen for depression/anxiety.  Encouraged regular exercise, healthy diet.  Sleep apnea ruled out last year.      Vaginal odor    Wet prep pending for further evaluation.      Relevant Orders   WET PREP BY MOLECULAR PROBE   Other Visit Diagnoses     Screening for cervical cancer       Relevant Orders   Cytology - PAP   Vitamin D deficiency       Relevant Orders   VITAMIN D 25 Hydroxy (Vit-D Deficiency, Fractures)   Vitamin B12 deficiency       Relevant Orders   Vitamin B12   Encounter for  screening mammogram for malignant neoplasm of breast       Relevant Orders   MM DIAG BREAST TOMO BILATERAL   US BREAST LTD UNI RIGHT INC AXILLA          Pleas Koch, NP

## 2022-04-17 NOTE — Assessment & Plan Note (Signed)
LMP 03/22/22.   Cramping has subsided.

## 2022-04-17 NOTE — Assessment & Plan Note (Addendum)
TDAP given today.   PAP smear done today.  Mammogram referral sent.   Discussed the importance of eating healthy diet and exercise.   Labs pending.  I evaluated patient, was consulted regarding treatment, and agree with assessment and plan per Tinnie Gens, RN, DNP student.   Allie Bossier, NP-C'

## 2022-04-17 NOTE — Progress Notes (Signed)
Complete physical exam  Patient: Debbie Bowen   DOB: 1980/09/27   41 y.o. Female  MRN: [id number]  Subjective:    Chief Complaint  Patient presents with   Annual Exam    Fasting    Vaginal Discharge    Normal discharge but has odor x 4 days. Denies any vaginal itching or urinary symptoms.    Infertility: Did not follow through with [facility]. Interested in getting a referral to endocrinologist to look hormonal levels. She has been to [facility] in the past and IVF was recommended.   Vaginal Odor: Started four days. Has a clear discharge with odor. Has not tried anything OTC. She denies any itching or burning. Has noticed her underwear has been irritating her but there was no change in detergent.  Fatigue: Waking up tired started about two months ago. She did cut back on dairy. No change in lifestyle. No trouble falling asleep. Normal sleep study in the past (maybe a year ago). Has a history of anxiety/add. Does not feel that could be contributing to this. She does take naps through out the day and still wakes up tired.Her naps are usually 45-60 minutes 203 tines a week. She gets around 7 hours of sleep at night.   Debbie Bowen is a 41 y.o. female who presents today for complete physical and follow up of chronic conditions.  Immunizations: -Tetanus: Last one in 2013. Due now.  -Influenza: Due this season -Covid-19: Two vaccines  Diet: Fair diet.  Exercise: Regular exercise.  Eye exam: Completes annually  Dental exam: Completes semi-annually   Pap Smear: Completed in August, 2020-normal Mammogram: Completed in 2021. Due now. Scheduled for 04/30/22 at [facility].    Most recent fall risk assessment:    02/12/2021    8:38 AM  Fall Risk   Falls in the past year? 0  Number falls in past yr: 0  Injury with Fall? 0     Most recent depression screenings:    04/17/2022   10:48 AM 02/12/2021    8:39 AM  PHQ 2/9 Scores  PHQ - 2 Score 0 0   PHQ- 9 Score 0 3     Patient Active Problem List   Diagnosis Date Noted   Fatigue 04/17/2022   Epigastric pain 01/08/2022   Pain in thumb joint with movement of right hand 07/11/2021   GAD (generalized anxiety disorder) 02/12/2021   Tattoo reaction 12/03/2020   Snoring 03/20/2020   Communication problem 03/20/2020   Chronic hip pain 11/07/2019   Facial pain 09/22/2019   GERD (gastroesophageal reflux disease) 09/14/2019   Palpitations 09/14/2019   Family history of breast cancer gene mutation in first degree relative 04/07/2019   Dysmenorrhea 01/18/2018   Chronic bilateral low back pain without sciatica 06/05/2017   Dermatitis 05/19/2016   Perennial allergic rhinitis 05/19/2016   Encounter for annual general medical examination with abnormal findings in adult 12/31/2015   Hyperlipidemia 12/31/2015   Infertility, female 12/20/2015   ADHD (attention deficit hyperactivity disorder) 12/06/2015   Abdominal pain 12/06/2015   Recurrent UTI 12/06/2015   Past Medical History:  Diagnosis Date   ADD (attention deficit disorder)    No medications   BV (bacterial vaginosis)    recurrent   Eczema    GERD (gastroesophageal reflux disease)    Insomnia    Orthodontics    invisalign   Scoliosis    no issues   Urticaria    UTI (lower urinary tract infection)  recurrent   Wears contact lenses    Past Surgical History:  Procedure Laterality Date   CERVIX LESION DESTRUCTION     DILATION AND CURETTAGE OF UTERUS     for TAB   ESOPHAGOGASTRODUODENOSCOPY (EGD) WITH PROPOFOL N/A 10/04/2015   Procedure: ESOPHAGOGASTRODUODENOSCOPY (EGD) WITH PROPOFOL;  Surgeon: Lucilla Lame, MD;  Location: [facility];  Service: Endoscopy;  Laterality: N/A;   Social History   Tobacco Use   Smoking status: Never   Smokeless tobacco: Never  Vaping Use   Vaping Use: Never used  Substance Use Topics   Alcohol use: Yes    Alcohol/week: 2.0 standard drinks of alcohol    Types: 2 Glasses of wine  per week    Comment: occasionally   Drug use: No      Patient Care Team: Pleas Koch, NP as PCP - General (Internal Medicine) Kate Sable, MD as PCP - Cardiology (Cardiology) Vladimir Crofts, MD as Consulting Physician (Neurology) Donnamae Jude, MD as Consulting Physician (Obstetrics and Gynecology)   Outpatient Medications Prior to Visit  Medication Sig   nitrofurantoin (MACRODANTIN) 50 MG capsule Take 1 capsule (50 mg total) by mouth as needed. For UTI prevention.   Prenatal Multivit-Min-Fe-FA (PRENATAL, W/IRON & FA,) 27-0.8 MG TABS Take 1 tablet by mouth daily.   ibuprofen (ADVIL) 200 MG tablet Take 200 mg by mouth as needed. (Patient not taking: Reported on 04/17/2022)   [DISCONTINUED] norethindrone-ethinyl estradiol (JUNEL 1/20) 1-20 MG-MCG tablet Take 1 tablet by mouth daily. (Patient not taking: Reported on 04/17/2022)   [DISCONTINUED] omeprazole (PRILOSEC) 20 MG capsule Take 1 capsule (20 mg total) by mouth daily. For heartburn. (Patient not taking: Reported on 04/17/2022)   No facility-administered medications prior to visit.    Review of Systems  Constitutional:  Positive for malaise/fatigue. Negative for chills and fever.  HENT:  Negative for ear pain, hearing loss and sinus pain.   Eyes:  Negative for blurred vision.  Respiratory:  Negative for shortness of breath and wheezing.   Cardiovascular:  Negative for chest pain, palpitations and leg swelling.  Gastrointestinal:  Negative for abdominal pain, heartburn and nausea.  Genitourinary:  Negative for dysuria, hematuria and urgency.       Vaginal odor  Musculoskeletal:  Negative for myalgias and neck pain.  Skin:  Negative for itching and rash.  Neurological: Negative.  Negative for dizziness, weakness and headaches.  Endo/Heme/Allergies:  Negative for environmental allergies and polydipsia. Does not bruise/bleed easily.  Psychiatric/Behavioral:  The patient is not nervous/anxious.   All other systems  reviewed and are negative.         Objective:     BP 118/74   Pulse 87   Temp 98.1 F (36.7 C) (Oral)   Ht '5\' 6"'$  (1.676 m)   Wt 140 lb (63.5 kg)   LMP 03/22/2022   SpO2 99%   BMI 22.60 kg/m  BP Readings from Last 3 Encounters:  04/17/22 118/74  02/07/22 110/62  01/23/22 108/62   Wt Readings from Last 3 Encounters:  04/17/22 140 lb (63.5 kg)  02/07/22 142 lb (64.4 kg)  01/23/22 140 lb (63.5 kg)      Physical Exam Vitals and nursing note reviewed.  Constitutional:      Appearance: Normal appearance.  HENT:     Right Ear: Tympanic membrane, ear canal and external ear normal.     Left Ear: Tympanic membrane, ear canal and external ear normal.     Nose: Nose normal.  Mouth/Throat:     Mouth: Mucous membranes are moist.  Eyes:     Extraocular Movements: Extraocular movements intact.     Conjunctiva/sclera: Conjunctivae normal.     Pupils: Pupils are equal, round, and reactive to light.  Cardiovascular:     Rate and Rhythm: Normal rate and regular rhythm.     Pulses: Normal pulses.     Heart sounds: Normal heart sounds.  Pulmonary:     Effort: Pulmonary effort is normal.     Breath sounds: Normal breath sounds.  Abdominal:     General: Bowel sounds are normal.     Palpations: Abdomen is soft.  Genitourinary:    General: Normal vulva.     Vagina: No vaginal discharge.  Musculoskeletal:        General: Normal range of motion.     Cervical back: Normal range of motion.  Skin:    General: Skin is warm and dry.  Neurological:     Mental Status: She is alert and oriented to person, place, and time.  Psychiatric:        Mood and Affect: Mood normal.        Behavior: Behavior normal.        Thought Content: Thought content normal.      No results found for any visits on 04/17/22.     Assessment & Plan:    Routine Health Maintenance and Physical Exam  Immunization History  Administered Date(s) Administered   PFIZER(Purple Top)SARS-COV-2 Vaccination  11/12/2019, 12/03/2019   Tdap 12/31/2011    Health Maintenance  Topic Date Due   TETANUS/TDAP  12/30/2021   PAP SMEAR-Modifier  04/06/2022   Hepatitis C Screening  Completed   HIV Screening  Completed   HPV VACCINES  Aged Out   INFLUENZA VACCINE  Discontinued   COVID-19 Vaccine  Discontinued    Discussed health benefits of physical activity, and encouraged her to engage in regular exercise appropriate for her age and condition.  Problem List Items Addressed This Visit       Digestive   GERD (gastroesophageal reflux disease)    Intermittent. Overall controlled.  Continue to monitor your diet and your trigger foods.        Genitourinary   Recurrent UTI    Stable. Last UTI was two months ago.   Continue Nitrofurantoin 50 mg as needed.      Infertility, female    Will send the referral to endocrinologist.   Also, recommended to find another fertility clinic.       Relevant Orders   Ambulatory referral to Endocrinology   Dysmenorrhea    LMP 03/22/22.   Cramping has subsided.        Other   Encounter for annual general medical examination with abnormal findings in adult    TDAP given today.   PAP smear done today.  Mammogram referral sent.   Discussed the importance of eating healthy diet and exercise.   Labs pending.      Fatigue    History of vitamin B12 deficiency and vitamin d deficiency.   Will recheck those to see if that could be contributing to her fatigue.        Other Visit Diagnoses     Screening for cervical cancer    -  Primary   Relevant Orders   Cytology - PAP   Vaginal odor       Relevant Orders   WET PREP BY MOLECULAR PROBE   Vitamin D deficiency  Relevant Orders   VITAMIN D 25 Hydroxy (Vit-D Deficiency, Fractures)   Vitamin B12 deficiency       Relevant Orders   Vitamin B12   Encounter for screening mammogram for malignant neoplasm of breast       Relevant Orders   MM DIAG BREAST TOMO BILATERAL   US BREAST LTD UNI  RIGHT INC AXILLA      No follow-ups on file.     Tinnie Gens, RN

## 2022-04-17 NOTE — Assessment & Plan Note (Signed)
Wet prep pending for further evaluation.

## 2022-04-17 NOTE — Assessment & Plan Note (Addendum)
Checking labs today to rule out metabolic cause. Screen for depression/anxiety.  Encouraged regular exercise, healthy diet.  Sleep apnea ruled out last year.

## 2022-04-17 NOTE — Telephone Encounter (Signed)
Joellen, can we fax her diagnostic mammogram and breast US orders to [facility]? See Anastasiya's message.

## 2022-04-18 LAB — WET PREP BY MOLECULAR PROBE
Candida species: NOT DETECTED
Gardnerella vaginalis: NOT DETECTED
MICRO NUMBER:: 13794026
SPECIMEN QUALITY:: ADEQUATE
Trichomonas vaginosis: NOT DETECTED

## 2022-04-18 NOTE — Addendum Note (Signed)
Addended by: Francella Solian on: 04/18/2022 10:26 AM   Modules accepted: Orders

## 2022-04-21 ENCOUNTER — Other Ambulatory Visit (INDEPENDENT_AMBULATORY_CARE_PROVIDER_SITE_OTHER): Payer: Commercial Managed Care - HMO

## 2022-04-21 DIAGNOSIS — E559 Vitamin D deficiency, unspecified: Secondary | ICD-10-CM

## 2022-04-21 DIAGNOSIS — E538 Deficiency of other specified B group vitamins: Secondary | ICD-10-CM | POA: Diagnosis not present

## 2022-04-21 LAB — CYTOLOGY - PAP
Comment: NEGATIVE
Diagnosis: NEGATIVE
High risk HPV: NEGATIVE

## 2022-04-21 NOTE — Telephone Encounter (Signed)
Order faxed.  No further action needed at this time.

## 2022-04-22 LAB — VITAMIN B12: Vitamin B-12: 300 pg/mL (ref 211–911)

## 2022-04-22 LAB — VITAMIN D 25 HYDROXY (VIT D DEFICIENCY, FRACTURES): VITD: 12.72 ng/mL — ABNORMAL LOW (ref 30.00–100.00)

## 2022-05-01 ENCOUNTER — Ambulatory Visit: Payer: Commercial Managed Care - HMO | Admitting: Obstetrics and Gynecology

## 2022-05-01 LAB — HM MAMMOGRAPHY

## 2022-05-06 ENCOUNTER — Encounter: Payer: Self-pay | Admitting: Primary Care

## 2022-05-29 ENCOUNTER — Ambulatory Visit: Payer: Commercial Managed Care - HMO | Admitting: Primary Care

## 2022-05-29 ENCOUNTER — Ambulatory Visit (INDEPENDENT_AMBULATORY_CARE_PROVIDER_SITE_OTHER): Payer: Commercial Managed Care - HMO | Admitting: Primary Care

## 2022-05-29 ENCOUNTER — Encounter: Payer: Self-pay | Admitting: Primary Care

## 2022-05-29 VITALS — BP 112/76 | HR 85 | Temp 99.7°F | Ht 66.0 in | Wt 144.0 lb

## 2022-05-29 DIAGNOSIS — N390 Urinary tract infection, site not specified: Secondary | ICD-10-CM | POA: Diagnosis not present

## 2022-05-29 DIAGNOSIS — R3915 Urgency of urination: Secondary | ICD-10-CM

## 2022-05-29 DIAGNOSIS — R3129 Other microscopic hematuria: Secondary | ICD-10-CM

## 2022-05-29 DIAGNOSIS — R350 Frequency of micturition: Secondary | ICD-10-CM

## 2022-05-29 LAB — POC URINALSYSI DIPSTICK (AUTOMATED)
Bilirubin, UA: NEGATIVE
Blood, UA: POSITIVE
Glucose, UA: NEGATIVE
Ketones, UA: NEGATIVE
Leukocytes, UA: NEGATIVE
Nitrite, UA: NEGATIVE
Protein, UA: POSITIVE — AB
Spec Grav, UA: 1.02 (ref 1.010–1.025)
Urobilinogen, UA: 0.2 U/dL
pH, UA: 6 (ref 5.0–8.0)

## 2022-05-29 MED ORDER — NITROFURANTOIN MACROCRYSTAL 50 MG PO CAPS: 50.0000 mg | ORAL_CAPSULE | ORAL | 0 refills | Status: DC | PRN

## 2022-05-29 NOTE — Assessment & Plan Note (Addendum)
UA today with trace blood, otherwise negative for leukocytes or nitrates.  Upon chart review, she does have a long history of microscopic hematuria. See other notes.   Urine culture pending.   Refills provided for Nitrofurantoin 50 mg as needed for UTI. Discussed to hold for now.  I evaluated patient, was consulted regarding treatment, and agree with assessment and plan per Tinnie Gens, RN, DNP student.   Allie Bossier, NP-C

## 2022-05-29 NOTE — Progress Notes (Signed)
Established Patient Office Visit  Subjective   Patient ID: Debbie Bowen, female    DOB: Jun 25, 1981  Age: 41 y.o. MRN: [id number]  Chief Complaint  Patient presents with   Urinary Frequency    Urinary Frequency  Associated symptoms include frequency and urgency. Pertinent negatives include no chills, nausea or vomiting.    Debbie Bowen is a 41 year old female with past medical history of GERD, ADHD, generalized anxiety disorder, recurrent UTI presents today for lower abdominal pain.   She has a history of recurrent UTI. She has been taking some herbal tea. She has urgency and burning started yesterday. She does not have any lower abdominal pain and the burning has subsided. She has a prescription of Macrobid to use PRN but she is out. She is trying to avoid using the antibiotic unless she has to. She is not currently on her period.   Patient Active Problem List   Diagnosis Date Noted   Urinary urgency 05/29/2022   Urinary frequency 05/29/2022   Microscopic hematuria 05/29/2022   Fatigue 04/17/2022   Vaginal odor 04/17/2022   Epigastric pain 01/08/2022   Pain in thumb joint with movement of right hand 07/11/2021   GAD (generalized anxiety disorder) 02/12/2021   Tattoo reaction 12/03/2020   Snoring 03/20/2020   Communication problem 03/20/2020   Chronic hip pain 11/07/2019   Facial pain 09/22/2019   GERD (gastroesophageal reflux disease) 09/14/2019   Palpitations 09/14/2019   Family history of breast cancer gene mutation in first degree relative 04/07/2019   Dysmenorrhea 01/18/2018   Chronic bilateral low back pain without sciatica 06/05/2017   Dermatitis 05/19/2016   Perennial allergic rhinitis 05/19/2016   Encounter for annual general medical examination with abnormal findings in adult 12/31/2015   Hyperlipidemia 12/31/2015   Infertility, female 12/20/2015   ADHD (attention deficit hyperactivity disorder) 12/06/2015   Abdominal pain 12/06/2015    Recurrent UTI 12/06/2015   Past Medical History:  Diagnosis Date   ADD (attention deficit disorder)    No medications   BV (bacterial vaginosis)    recurrent   Eczema    GERD (gastroesophageal reflux disease)    Insomnia    Orthodontics    invisalign   Scoliosis    no issues   Urticaria    UTI (lower urinary tract infection)    recurrent   Wears contact lenses    Past Surgical History:  Procedure Laterality Date   CERVIX LESION DESTRUCTION     DILATION AND CURETTAGE OF UTERUS     for TAB   ESOPHAGOGASTRODUODENOSCOPY (EGD) WITH PROPOFOL N/A 10/04/2015   Procedure: ESOPHAGOGASTRODUODENOSCOPY (EGD) WITH PROPOFOL;  Surgeon: Lucilla Lame, MD;  Location: [facility];  Service: Endoscopy;  Laterality: N/A;   Social History   Tobacco Use   Smoking status: Never   Smokeless tobacco: Never  Vaping Use   Vaping Use: Never used  Substance Use Topics   Alcohol use: Yes    Alcohol/week: 2.0 standard drinks of alcohol    Types: 2 Glasses of wine per week    Comment: occasionally   Drug use: No   Family History  Problem Relation Age of Onset   Diabetes Mother    Breast cancer Mother 60       Breast one side BRCA negative. Triple negative   Juvenile idiopathic arthritis Father    Eczema Father    Diabetes Maternal Aunt    Diabetes Paternal Aunt    Breast cancer Paternal Grandmother  Diabetes Paternal Grandmother    Asthma Sister    Asthma Brother    Diabetes Maternal Grandmother    Prostate cancer Maternal Grandfather    Prostate cancer Paternal Grandfather    Breast cancer Paternal Aunt 52   Breast cancer Paternal Aunt 36   Urticaria Neg Hx    Allergic rhinitis Neg Hx    Angioedema Neg Hx    Immunodeficiency Neg Hx    No Known Allergies    Review of Systems  Constitutional:  Negative for chills and fever.  Respiratory:  Negative for shortness of breath.   Cardiovascular:  Negative for chest pain.  Gastrointestinal:  Negative for constipation, diarrhea,  nausea and vomiting.  Genitourinary:  Positive for frequency and urgency.  Neurological:  Negative for dizziness and headaches.      Objective:     BP 112/76   Pulse 85   Temp 99.7 F (37.6 C) (Temporal)   Ht 5' 6"  (1.676 m)   Wt 144 lb (65.3 kg)   SpO2 99%   BMI 23.24 kg/m  BP Readings from Last 3 Encounters:  05/29/22 112/76  04/17/22 118/74  02/07/22 110/62   Wt Readings from Last 3 Encounters:  05/29/22 144 lb (65.3 kg)  04/17/22 140 lb (63.5 kg)  02/07/22 142 lb (64.4 kg)      Physical Exam Vitals reviewed.  Constitutional:      Appearance: Normal appearance.  Cardiovascular:     Rate and Rhythm: Normal rate and regular rhythm.     Pulses: Normal pulses.     Heart sounds: Normal heart sounds.  Pulmonary:     Effort: Pulmonary effort is normal.     Breath sounds: Normal breath sounds.  Neurological:     Mental Status: She is alert and oriented to person, place, and time.      No results found for any visits on 05/29/22.     The 10-year ASCVD risk score (Arnett DK, et al., 2019) is: 0.3%    Assessment & Plan:   Problem List Items Addressed This Visit       Genitourinary   Recurrent UTI   Relevant Medications   nitrofurantoin (MACRODANTIN) 50 MG capsule   Microscopic hematuria    POC UA positive for blood and protein.   Notice a trend of positive trace of blood and protein on U.A.   Urine microscopic pending.  Will continue to monitor.  Consider urology referral.       Relevant Orders   Urine Microscopic     Other   Urinary urgency - Primary    POC UA negative for leukocytes or nitrates.   Urine culture pending.   Sent in refill for Nitrofurantoin 50 mg as needed for UTI.       Relevant Orders   POCT Urinalysis Dipstick (Automated)   Urine Culture   Urinary frequency   Relevant Orders   POCT Urinalysis Dipstick (Automated)   Urine Culture    No follow-ups on file.    Tinnie Gens, BSN-RN, DNP STUDENT

## 2022-05-29 NOTE — Assessment & Plan Note (Signed)
Refill provided for Macrobid 50 mg to use PRN. Hold off for today's visit until culture returns.

## 2022-05-29 NOTE — Progress Notes (Signed)
Subjective:    Patient ID: Debbie Bowen, female    DOB: 1981/03/15, 41 y.o.   MRN: [id number]  Urinary Frequency  Associated symptoms include frequency. Pertinent negatives include no hematuria.    Debbie Bowen is a very pleasant 41 y.o. female with frequent UTI who presents today to discuss urinary frequency.   Symptom onset yesterday with dysuria and frequency. Today she's not noticed dysuria but continues to notice frequency. These symptoms appear different than her typical UTI symptoms.   She denies hematuria, but mentions that she has a history of microscopic blood in her urine. She was seeing Urology years ago and denies known prior work up for hematuria.   She is managed on Macrobid for which she takes PRN for UTI symptoms. She hasn't taken this for her current symptoms.   BP Readings from Last 3 Encounters:  05/29/22 112/76  04/17/22 118/74  02/07/22 110/62        Review of Systems  Constitutional:  Negative for fever.  Gastrointestinal:  Negative for abdominal pain.  Genitourinary:  Positive for frequency. Negative for dysuria, hematuria, pelvic pain and vaginal discharge.         Past Medical History:  Diagnosis Date   ADD (attention deficit disorder)    No medications   BV (bacterial vaginosis)    recurrent   Eczema    GERD (gastroesophageal reflux disease)    Insomnia    Orthodontics    invisalign   Scoliosis    no issues   Urticaria    UTI (lower urinary tract infection)    recurrent   Wears contact lenses     Social History   Socioeconomic History   Marital status: Married    Spouse name: Not on file   Number of children: Not on file   Years of education: 16   Highest education level: Not on file  Occupational History   Occupation: Sales promotion account executive    Comment: [employer]  Tobacco Use   Smoking status: Never   Smokeless tobacco: Never  Vaping Use   Vaping Use: Never used  Substance and Sexual  Activity   Alcohol use: Yes    Alcohol/week: 2.0 standard drinks of alcohol    Types: 2 Glasses of wine per week    Comment: occasionally   Drug use: No   Sexual activity: Yes    Partners: Male    Birth control/protection: None  Other Topics Concern   Not on file  Social History Narrative   Single.    No children.   Self Employed.   Enjoys projects around her house.    Social Determinants of Health   Financial Resource Strain: Not on file  Food Insecurity: Not on file  Transportation Needs: Not on file  Physical Activity: Not on file  Stress: Not on file  Social Connections: Not on file  Intimate Partner Violence: Not on file    Past Surgical History:  Procedure Laterality Date   CERVIX LESION DESTRUCTION     DILATION AND CURETTAGE OF UTERUS     for TAB   ESOPHAGOGASTRODUODENOSCOPY (EGD) WITH PROPOFOL N/A 10/04/2015   Procedure: ESOPHAGOGASTRODUODENOSCOPY (EGD) WITH PROPOFOL;  Surgeon: Lucilla Lame, MD;  Location: [place] [facility];  Service: Endoscopy;  Laterality: N/A;    Family History  Problem Relation Age of Onset   Diabetes Mother    Breast cancer Mother 53       Breast one side BRCA negative. Triple negative   Juvenile idiopathic  arthritis Father    Eczema Father    Diabetes Maternal Aunt    Diabetes Paternal 42    Breast cancer Paternal Grandmother    Diabetes Paternal Grandmother    Asthma Sister    Asthma Brother    Diabetes Maternal Grandmother    Prostate cancer Maternal Grandfather    Prostate cancer Paternal Grandfather    Breast cancer Paternal Aunt 32   Breast cancer Paternal Aunt 73   Urticaria Neg Hx    Allergic rhinitis Neg Hx    Angioedema Neg Hx    Immunodeficiency Neg Hx     No Known Allergies  Current Outpatient Medications on File Prior to Visit  Medication Sig Dispense Refill   ibuprofen (ADVIL) 200 MG tablet Take 200 mg by mouth as needed.     Prenatal Multivit-Min-Fe-FA (PRENATAL, W/IRON & FA,) 27-0.8 MG TABS Take 1 tablet  by mouth daily.     No current facility-administered medications on file prior to visit.    BP 112/76   Pulse 85   Temp 99.7 F (37.6 C) (Temporal)   Ht _0  (1.676 m)   Wt 144 lb (65.3 kg)   SpO2 99%   BMI 23.24 kg/m  Objective:   Physical Exam Cardiovascular:     Rate and Rhythm: Normal rate and regular rhythm.  Pulmonary:     Effort: Pulmonary effort is normal.     Breath sounds: Normal breath sounds.  Musculoskeletal:     Cervical back: Neck supple.  Skin:    General: Skin is warm and dry.           Assessment & Plan:   Problem List Items Addressed This Visit       Genitourinary   Recurrent UTI    Refill provided for Macrobid 50 mg to use PRN. Hold off for today's visit until culture returns.      Relevant Medications   nitrofurantoin (MACRODANTIN) 50 MG capsule   Microscopic hematuria    UA today with trace blood and protein.  Chronic history of microscopic hematuria noted from prior UA's upon chart review. No prior work up noted.   Urine microscopic pending.    Consider urology referral for further work up.  I evaluated patient, was consulted regarding treatment, and agree with assessment and plan per Tinnie Gens, RN, DNP student.   Allie Bossier, NP-C       Relevant Orders   Urine Microscopic     Other   Urinary urgency - Primary    UA today with trace blood, otherwise negative for leukocytes or nitrates.  Upon chart review, she does have a long history of microscopic hematuria. See other notes.   Urine culture pending.   Refills provided for Nitrofurantoin 50 mg as needed for UTI. Discussed to hold for now.  I evaluated patient, was consulted regarding treatment, and agree with assessment and plan per Tinnie Gens, RN, DNP student.   Allie Bossier, NP-C       Relevant Orders   POCT Urinalysis Dipstick (Automated) (Completed)   Urine Culture   RESOLVED: Urinary frequency   Relevant Orders   POCT Urinalysis Dipstick (Automated)  (Completed)   Urine Culture       Pleas Koch, NP

## 2022-05-29 NOTE — Assessment & Plan Note (Addendum)
UA today with trace blood and protein.  Chronic history of microscopic hematuria noted from prior UA's upon chart review. No prior work up noted.   Urine microscopic pending.    Consider urology referral for further work up.  I evaluated patient, was consulted regarding treatment, and agree with assessment and plan per Tinnie Gens, RN, DNP student.   Allie Bossier, NP-C

## 2022-05-30 LAB — URINALYSIS, MICROSCOPIC ONLY

## 2022-06-01 ENCOUNTER — Other Ambulatory Visit: Payer: Self-pay | Admitting: Primary Care

## 2022-06-01 DIAGNOSIS — R3129 Other microscopic hematuria: Secondary | ICD-10-CM

## 2022-06-01 DIAGNOSIS — N3001 Acute cystitis with hematuria: Secondary | ICD-10-CM

## 2022-06-01 LAB — URINE CULTURE
MICRO NUMBER:: 13981489
SPECIMEN QUALITY:: ADEQUATE

## 2022-06-01 MED ORDER — NITROFURANTOIN MONOHYD MACRO 100 MG PO CAPS: 100.0000 mg | ORAL_CAPSULE | Freq: Two times a day (BID) | ORAL | 0 refills | Status: AC

## 2022-06-13 ENCOUNTER — Encounter: Payer: Self-pay | Admitting: Primary Care

## 2022-06-13 ENCOUNTER — Ambulatory Visit (INDEPENDENT_AMBULATORY_CARE_PROVIDER_SITE_OTHER): Payer: Commercial Managed Care - HMO | Admitting: Primary Care

## 2022-06-13 VITALS — BP 122/70 | HR 75 | Temp 98.1°F | Ht 66.0 in | Wt 143.0 lb

## 2022-06-13 DIAGNOSIS — R110 Nausea: Secondary | ICD-10-CM | POA: Insufficient documentation

## 2022-06-13 DIAGNOSIS — R42 Dizziness and giddiness: Secondary | ICD-10-CM | POA: Diagnosis not present

## 2022-06-13 DIAGNOSIS — E785 Hyperlipidemia, unspecified: Secondary | ICD-10-CM | POA: Diagnosis not present

## 2022-06-13 DIAGNOSIS — R112 Nausea with vomiting, unspecified: Secondary | ICD-10-CM | POA: Insufficient documentation

## 2022-06-13 LAB — BASIC METABOLIC PANEL
BUN: 12 mg/dL (ref 6–23)
CO2: 27 mEq/L (ref 19–32)
Calcium: 9.2 mg/dL (ref 8.4–10.5)
Chloride: 102 mEq/L (ref 96–112)
Creatinine, Ser: 0.82 mg/dL (ref 0.40–1.20)
GFR: 88.54 mL/min (ref >60.00)
Glucose, Bld: 94 mg/dL (ref 70–99)
Potassium: 4.1 mEq/L (ref 3.5–5.1)
Sodium: 135 mEq/L (ref 135–145)

## 2022-06-13 LAB — LIPID PANEL
Cholesterol: 219 mg/dL — ABNORMAL HIGH (ref 0–200)
HDL: 55.5 mg/dL (ref >39.00)
LDL Cholesterol: 150 mg/dL — ABNORMAL HIGH (ref 0–99)
NonHDL: 163.89
Total CHOL/HDL Ratio: 4
Triglycerides: 69 mg/dL (ref 0.0–149.0)
VLDL: 13.8 mg/dL (ref 0.0–40.0)

## 2022-06-13 LAB — COMPREHENSIVE METABOLIC PANEL
ALT: 10 U/L (ref 0–35)
AST: 15 U/L (ref 0–37)
Albumin: 4.1 g/dL (ref 3.5–5.2)
Alkaline Phosphatase: 45 U/L (ref 39–117)
BUN: 12 mg/dL (ref 6–23)
CO2: 27 mEq/L (ref 19–32)
Calcium: 9.2 mg/dL (ref 8.4–10.5)
Chloride: 102 mEq/L (ref 96–112)
Creatinine, Ser: 0.82 mg/dL (ref 0.40–1.20)
GFR: 88.54 mL/min (ref >60.00)
Glucose, Bld: 94 mg/dL (ref 70–99)
Potassium: 4.1 mEq/L (ref 3.5–5.1)
Sodium: 135 mEq/L (ref 135–145)
Total Bilirubin: 0.3 mg/dL (ref 0.2–1.2)
Total Protein: 7.5 g/dL (ref 6.0–8.3)

## 2022-06-13 LAB — MAGNESIUM: Magnesium: 1.9 mg/dL (ref 1.5–2.5)

## 2022-06-13 NOTE — Patient Instructions (Signed)
Stop by the lab prior to leaving today. I will notify you of your results once received.   Stop magnesium supplement for 2 weeks. Update me regarding nausea.   Start famotidine 20 mg daily in 1 week if no improvement in nausea after stopping magnesium.  It was a pleasure to see you today!

## 2022-06-13 NOTE — Assessment & Plan Note (Signed)
Negative orthostatic vitals today.  Encouraged to rise slowly. Consider cardiology evaluation. Labs pending today.

## 2022-06-13 NOTE — Assessment & Plan Note (Addendum)
Could be secondary to magnesium supplement, will have her discontinue for now.  Checking labs today including CBC, CMP. Start famotidine 20 mg daily for potential GERD cause if no improvement in nausea after discontinuing magnesium.  She will update if nausea resolves after stopping magnesium supplement.

## 2022-06-13 NOTE — Progress Notes (Signed)
Subjective:    Patient ID: Debbie Bowen, female    DOB: 07/03/1981, 41 y.o.   MRN: [id number]  HPI  Debbie Bowen is a very pleasant 41 y.o. female with a history of recurrent UTI, GERD, allergic rhinitis, GAD, infertility who presents today to discuss nausea.   She's been experiencing intermittent, daily nausea for the last 2 months. Her nausea will occur with and without eating. She's experienced vomiting twice within the last 2 months. One episode of vomiting occurred while eating a grilled chicken sandwich and the second episode occurred while working in her yard, no strenuous activity.  She's also noticed dizziness with postural changes including laying to standing, sitting to standing, and bending forward and leaning back up.   Treated for acute cystitis on 06/01/22 with Macrobid BID x 5 days. She completed her Macrobid course. LMP was 06/11/22. She continues to have monthly menstrual cycles.   She denies esophageal burning, belching, epigastric pain, fevers. She is not taking any heartburn medication. She began taking magnesium glycinate about 6 weeks ago. She is taking a prenatal vitamin daily, does take with food, has been taking for about 6 months. She is drinking about 4 glasses of water daily, as drinks coconut water daily.   BP Readings from Last 3 Encounters:  06/13/22 122/70  05/29/22 112/76  04/17/22 118/74     Review of Systems  Constitutional:  Negative for fever.  Respiratory:  Negative for shortness of breath.   Cardiovascular:  Negative for chest pain.  Gastrointestinal:  Positive for nausea and vomiting. Negative for abdominal pain, constipation and diarrhea.  Neurological:  Positive for dizziness.         Past Medical History:  Diagnosis Date   ADD (attention deficit disorder)    No medications   BV (bacterial vaginosis)    recurrent   Eczema    GERD (gastroesophageal reflux disease)    Insomnia    Orthodontics    invisalign    Scoliosis    no issues   Urticaria    UTI (lower urinary tract infection)    recurrent   Wears contact lenses     Social History   Socioeconomic History   Marital status: Married    Spouse name: Not on file   Number of children: Not on file   Years of education: 16   Highest education level: Not on file  Occupational History   Occupation: Sales promotion account executive    Comment: [employer]  Tobacco Use   Smoking status: Never   Smokeless tobacco: Never  Vaping Use   Vaping Use: Never used  Substance and Sexual Activity   Alcohol use: Yes    Alcohol/week: 2.0 standard drinks of alcohol    Types: 2 Glasses of wine per week    Comment: occasionally   Drug use: No   Sexual activity: Yes    Partners: Male    Birth control/protection: None  Other Topics Concern   Not on file  Social History Narrative   Single.    No children.   Self Employed.   Enjoys projects around her house.    Social Determinants of Health   Financial Resource Strain: Not on file  Food Insecurity: Not on file  Transportation Needs: Not on file  Physical Activity: Not on file  Stress: Not on file  Social Connections: Not on file  Intimate Partner Violence: Not on file    Past Surgical History:  Procedure Laterality Date   CERVIX LESION  DESTRUCTION     DILATION AND CURETTAGE OF UTERUS     for TAB   ESOPHAGOGASTRODUODENOSCOPY (EGD) WITH PROPOFOL N/A 10/04/2015   Procedure: ESOPHAGOGASTRODUODENOSCOPY (EGD) WITH PROPOFOL;  Surgeon: Lucilla Lame, MD;  Location: [facility];  Service: Endoscopy;  Laterality: N/A;    Family History  Problem Relation Age of Onset   Diabetes Mother    Breast cancer Mother 61       Breast one side BRCA negative. Triple negative   Juvenile idiopathic arthritis Father    Eczema Father    Diabetes Maternal Aunt    Diabetes Paternal 2    Breast cancer Paternal Grandmother    Diabetes Paternal Grandmother    Asthma Sister    Asthma Brother    Diabetes  Maternal Grandmother    Prostate cancer Maternal Grandfather    Prostate cancer Paternal Grandfather    Breast cancer Paternal Aunt 81   Breast cancer Paternal Aunt 20   Urticaria Neg Hx    Allergic rhinitis Neg Hx    Angioedema Neg Hx    Immunodeficiency Neg Hx     No Known Allergies  Current Outpatient Medications on File Prior to Visit  Medication Sig Dispense Refill   nitrofurantoin (MACRODANTIN) 50 MG capsule Take 1 capsule (50 mg total) by mouth as needed. For UTI prevention. 30 capsule 0   Prenatal Multivit-Min-Fe-FA (PRENATAL, W/IRON & FA,) 27-0.8 MG TABS Take 1 tablet by mouth daily.     ibuprofen (ADVIL) 200 MG tablet Take 200 mg by mouth as needed. (Patient not taking: Reported on 06/13/2022)     No current facility-administered medications on file prior to visit.    BP 122/70   Pulse 75   Temp 98.1 F (36.7 C) (Temporal)   Ht 5' 6"  (1.676 m)   Wt 143 lb (64.9 kg)   LMP 06/11/2022   SpO2 98%   BMI 23.08 kg/m  Objective:   Physical Exam Cardiovascular:     Rate and Rhythm: Normal rate and regular rhythm.  Pulmonary:     Effort: Pulmonary effort is normal.     Breath sounds: Normal breath sounds.  Musculoskeletal:     Cervical back: Neck supple.  Skin:    General: Skin is warm and dry.           Assessment & Plan:   Problem List Items Addressed This Visit       Other   Hyperlipidemia   Relevant Orders   Lipid panel   Nausea - Primary    Could be secondary to magnesium supplement, will have her discontinue for now.  Checking labs today including CBC, CMP. Start famotidine 20 mg daily for potential GERD cause if no improvement in nausea after discontinuing magnesium.  She will update if nausea resolves after stopping magnesium supplement.       Relevant Orders   Basic metabolic panel   Comprehensive metabolic panel   Magnesium   Postural dizziness    Negative orthostatic vitals today.  Encouraged to rise slowly. Consider cardiology  evaluation. Labs pending today.          Pleas Koch, NP

## 2022-06-17 ENCOUNTER — Other Ambulatory Visit: Payer: Self-pay | Admitting: Primary Care

## 2022-06-17 DIAGNOSIS — R110 Nausea: Secondary | ICD-10-CM

## 2022-06-17 DIAGNOSIS — E785 Hyperlipidemia, unspecified: Secondary | ICD-10-CM

## 2022-06-20 ENCOUNTER — Other Ambulatory Visit (INDEPENDENT_AMBULATORY_CARE_PROVIDER_SITE_OTHER): Payer: Commercial Managed Care - HMO

## 2022-06-20 ENCOUNTER — Other Ambulatory Visit: Payer: Commercial Managed Care - HMO

## 2022-06-20 DIAGNOSIS — R110 Nausea: Secondary | ICD-10-CM

## 2022-06-20 DIAGNOSIS — R3129 Other microscopic hematuria: Secondary | ICD-10-CM | POA: Diagnosis not present

## 2022-06-20 DIAGNOSIS — E785 Hyperlipidemia, unspecified: Secondary | ICD-10-CM

## 2022-06-20 LAB — CBC WITH DIFFERENTIAL/PLATELET
Basophils Absolute: 0 10*3/uL (ref 0.0–0.1)
Basophils Relative: 0.9 % (ref 0.0–3.0)
Eosinophils Absolute: 0.1 10*3/uL (ref 0.0–0.7)
Eosinophils Relative: 1.8 % (ref 0.0–5.0)
HCT: 39.8 % (ref 36.0–46.0)
Hemoglobin: 13 g/dL (ref 12.0–15.0)
Lymphocytes Relative: 26.3 % (ref 12.0–46.0)
Lymphs Abs: 1 10*3/uL (ref 0.7–4.0)
MCHC: 32.6 g/dL (ref 30.0–36.0)
MCV: 82.8 fl (ref 78.0–100.0)
Monocytes Absolute: 0.3 10*3/uL (ref 0.1–1.0)
Monocytes Relative: 8.6 % (ref 3.0–12.0)
Neutro Abs: 2.5 10*3/uL (ref 1.4–7.7)
Neutrophils Relative %: 62.4 % (ref 43.0–77.0)
Platelets: 303 10*3/uL (ref 150.0–400.0)
RBC: 4.81 Mil/uL (ref 3.87–5.11)
RDW: 14.1 % (ref 11.5–15.5)
WBC: 4 10*3/uL (ref 4.0–10.5)

## 2022-06-20 LAB — URINALYSIS, MICROSCOPIC ONLY: RBC / HPF: NONE SEEN (ref 0–?)

## 2022-06-24 DIAGNOSIS — N3000 Acute cystitis without hematuria: Secondary | ICD-10-CM

## 2022-06-25 ENCOUNTER — Telehealth: Payer: Self-pay

## 2022-06-25 DIAGNOSIS — N390 Urinary tract infection, site not specified: Secondary | ICD-10-CM

## 2022-06-25 LAB — LIPOPROTEIN A (LPA): Lipoprotein (a): 193 nmol/L — ABNORMAL HIGH (ref <75)

## 2022-06-25 NOTE — Telephone Encounter (Signed)
Patient notified. On lab schedule for 10/26 to provide a urine sample.

## 2022-06-25 NOTE — Telephone Encounter (Signed)
Okay to have patient come in to provide [place] with a repeat urine specimen. She was recently in the office for this issue,.Would rather we collect the specimen than for her to bring a sample.   Will order POC UA with culture.

## 2022-06-25 NOTE — Telephone Encounter (Signed)
Spoke to patient, she is still experiencing urinary frequency she believes from a previous UTI she had 05/29/22. Offered an office visit for reevaluation, patient declined. She is requesting to just drop off urine sample for testing instead of making an office visit for this.  Please advise.

## 2022-06-26 ENCOUNTER — Other Ambulatory Visit: Payer: Commercial Managed Care - HMO

## 2022-06-26 NOTE — Telephone Encounter (Signed)
Noted  

## 2022-06-26 NOTE — Telephone Encounter (Signed)
Please call patient:  Received refill request from [facility] for Macrobid.  Does she actually need a refill? She should be taking this sparingly as needed. Did she switch to [facility]?

## 2022-06-26 NOTE — Telephone Encounter (Signed)
Called and spoke to patient. She is switching to [facility] pharmacy for all of her medications and they automatically sent this request. [facility] in her preferred pharmacies.  She does not need a refill on Macrobid at this time.

## 2022-07-11 ENCOUNTER — Telehealth: Payer: Self-pay

## 2022-07-11 NOTE — Telephone Encounter (Addendum)
Does she already need a refill?  If so, how often is she using?

## 2022-07-14 NOTE — Telephone Encounter (Signed)
Patient does not need a refill at this time. She takes the medication PRN.  She states she will call [facility] and take it off of auto refill so it will stop sending automated requests.

## 2022-07-18 ENCOUNTER — Ambulatory Visit (INDEPENDENT_AMBULATORY_CARE_PROVIDER_SITE_OTHER): Payer: Commercial Managed Care - HMO | Admitting: Primary Care

## 2022-07-18 ENCOUNTER — Encounter: Payer: Self-pay | Admitting: Primary Care

## 2022-07-18 VITALS — BP 118/72 | HR 88 | Temp 97.2°F | Ht 66.0 in | Wt 144.0 lb

## 2022-07-18 DIAGNOSIS — M25562 Pain in left knee: Secondary | ICD-10-CM | POA: Diagnosis not present

## 2022-07-18 DIAGNOSIS — M25569 Pain in unspecified knee: Secondary | ICD-10-CM

## 2022-07-18 HISTORY — DX: Pain in unspecified knee: M25.569

## 2022-07-18 NOTE — Assessment & Plan Note (Signed)
No obvious fracture, and she's able to carry on with most ADL's per usual without difficulty.  Fortunately, she's improving.  Offered xray, but since she's feeling better and is able to participate in daily activities we will defer for now. She will update if symptoms do not continue to improve and/or become worse.   Discussed use of Voltaren Gel OTC.

## 2022-07-18 NOTE — Patient Instructions (Signed)
You can try diclofenac (Voltaren) gel to the knee as needed for pain.   Continue with your daily exercise.  Update me if anything changes.   It was a pleasure to see you today!

## 2022-07-18 NOTE — Progress Notes (Signed)
Subjective:    Patient ID: Debbie Bowen, female    DOB: 1981/02/13, 41 y.o.   MRN: [id number]  HPI  Debbie Bowen is a very pleasant 41 y.o. female with a history of chronic hip pain, recurrent UTI, hyperlipidemia who presents today to discuss knee pain.  Her pain is located to the left distal anterior knee which began 3 weeks ago after falling.  She accidentally fell out of bed onto her hardwood floors three weeks ago after having a scary dream. She believes she fell onto bilateral anterior knees.   She denies decrease in ROM, and is able to walk without difficulty, but she will notice pain/pressure at times. She also cannot kneel or apply direct pressure without pain. She's also noticed a knot to the anterior distal side of her knee. Just after her fall she did notice bruising to the site which has healed.   She's not taking anything OTC for symptoms. Overall she's noticed improvement. She's been doing piliates three times weekly and is able to do this without problems.    Review of Systems  Musculoskeletal:  Positive for arthralgias. Negative for joint swelling.  Skin:  Negative for color change.         Past Medical History:  Diagnosis Date   ADD (attention deficit disorder)    No medications   BV (bacterial vaginosis)    recurrent   Eczema    GERD (gastroesophageal reflux disease)    Insomnia    Orthodontics    invisalign   Scoliosis    no issues   Urticaria    UTI (lower urinary tract infection)    recurrent   Wears contact lenses     Social History   Socioeconomic History   Marital status: Married    Spouse name: Not on file   Number of children: Not on file   Years of education: 16   Highest education level: Not on file  Occupational History   Occupation: Sales promotion account executive    Comment: [employer]  Tobacco Use   Smoking status: Never   Smokeless tobacco: Never  Vaping Use   Vaping Use: Never used  Substance and  Sexual Activity   Alcohol use: Yes    Alcohol/week: 2.0 standard drinks of alcohol    Types: 2 Glasses of wine per week    Comment: occasionally   Drug use: No   Sexual activity: Yes    Partners: Male    Birth control/protection: None  Other Topics Concern   Not on file  Social History Narrative   Single.    No children.   Self Employed.   Enjoys projects around her house.    Social Determinants of Health   Financial Resource Strain: Not on file  Food Insecurity: Not on file  Transportation Needs: Not on file  Physical Activity: Not on file  Stress: Not on file  Social Connections: Not on file  Intimate Partner Violence: Not on file    Past Surgical History:  Procedure Laterality Date   CERVIX LESION DESTRUCTION     DILATION AND CURETTAGE OF UTERUS     for TAB   ESOPHAGOGASTRODUODENOSCOPY (EGD) WITH PROPOFOL N/A 10/04/2015   Procedure: ESOPHAGOGASTRODUODENOSCOPY (EGD) WITH PROPOFOL;  Surgeon: Lucilla Lame, MD;  Location: [facility];  Service: Endoscopy;  Laterality: N/A;    Family History  Problem Relation Age of Onset   Diabetes Mother    Breast cancer Mother 40       Breast  one side BRCA negative. Triple negative   Juvenile idiopathic arthritis Father    Eczema Father    Diabetes Maternal Aunt    Diabetes Paternal 32    Breast cancer Paternal Grandmother    Diabetes Paternal Grandmother    Asthma Sister    Asthma Brother    Diabetes Maternal Grandmother    Prostate cancer Maternal Grandfather    Prostate cancer Paternal Grandfather    Breast cancer Paternal Aunt 103   Breast cancer Paternal Aunt 42   Urticaria Neg Hx    Allergic rhinitis Neg Hx    Angioedema Neg Hx    Immunodeficiency Neg Hx     No Known Allergies  Current Outpatient Medications on File Prior to Visit  Medication Sig Dispense Refill   nitrofurantoin (MACRODANTIN) 50 MG capsule Take 1 capsule (50 mg total) by mouth as needed. For UTI prevention. 30 capsule 0   Prenatal  Multivit-Min-Fe-FA (PRENATAL, W/IRON & FA,) 27-0.8 MG TABS Take 1 tablet by mouth daily.     ibuprofen (ADVIL) 200 MG tablet Take 200 mg by mouth as needed. (Patient not taking: Reported on 06/13/2022)     No current facility-administered medications on file prior to visit.    BP 118/72   Pulse 88   Temp (!) 97.2 F (36.2 C) (Temporal)   Ht _0  (1.676 m)   Wt 144 lb (65.3 kg)   LMP 07/06/2022   SpO2 98%   BMI 23.24 kg/m  Objective:   Physical Exam Constitutional:      General: She is not in acute distress. Musculoskeletal:     Left knee: Swelling present. No deformity, erythema or bony tenderness. Normal range of motion.     Comments: Mild swelling noted to distal anterior knee, slightly medial. Tender.            Assessment & Plan:   Problem List Items Addressed This Visit       Other   Acute knee pain - Primary    No obvious fracture, and she's able to carry on with most ADL's per usual without difficulty.  Fortunately, she's improving.  Offered xray, but since she's feeling better and is able to participate in daily activities we will defer for now. She will update if symptoms do not continue to improve and/or become worse.   Discussed use of Voltaren Gel OTC.           Pleas Koch, NP

## 2022-10-14 ENCOUNTER — Telehealth: Payer: Self-pay | Admitting: Primary Care

## 2022-10-14 NOTE — Telephone Encounter (Signed)
Patient called in and stated that she is experiencing some congestion, scratchy throat, and SOB when coughing and laughing. Sent to access nurse.

## 2022-10-14 NOTE — Telephone Encounter (Signed)
[facility] - Client TELEPHONE ADVICE RECORD [facility] Patient Name: Debbie Bowen Gender: Female DOB: 21-Sep-1980 Age: 42 Y 61 M Return Phone Number: [phone] (Primary) Address: City/ State/ Zip: [place] [address] Client [facility] - Client Client Site [facility] - Day Provider Alma Friendly - NP Contact Type Call Who Is Calling Patient / Member / Family / Caregiver Call Type Triage / Clinical Relationship To Patient Self Return Phone Number [phone] (Primary) Chief Complaint Sore Throat Reason for Call Symptomatic / Request for Debbie Bowen states she has a patient on the line who is congested, short of breath when she laughs as well as has a scratchy throat. Translation No Nurse Assessment Nurse: Lenon Curt, RN, Melanie Date/Time (Eastern Time): 10/14/2022 12:32:36 PM Confirm and document reason for call. If symptomatic, describe symptoms. ---Caller states has short of breath when laughs/coughs for 2 weeks, no CP. Mucinex didn't help much. Does the patient have any new or worsening symptoms? ---Yes Will a triage be completed? ---Yes Related visit to physician within the last 2 weeks? ---No Does the PT have any chronic conditions? (i.e. diabetes, asthma, this includes High risk factors for pregnancy, etc.) ---No Is the patient pregnant or possibly pregnant? (Ask all females between the ages of 61-55) ---No Is this a behavioral health or substance abuse call? ---No Guidelines Guideline Title Affirmed Question Affirmed Notes Nurse Date/Time (Eastern Time) Cough - Acute NonProductive [1] Nasal discharge AND [2] present > 10 days Allean Found 10/14/2022 12:34:11 PM Disp. Time Eilene Ghazi Time) Disposition Final User 10/14/2022 12:05:59 PM Attempt made - message left Lenon Curt, RN, Threasa Beards 10/14/2022 12:37:15 PM SEE PCP WITHIN 3 DAYS Yes  Lenon Curt, RN, Melanie PLEASE NOTE: All timestamps contained within this report are represented as [place] Standard Time. CONFIDENTIALTY NOTICE: This fax transmission is intended only for the addressee. It contains information that is legally privileged, confidential or otherwise protected from use or disclosure. If you are not the intended recipient, you are strictly prohibited from reviewing, disclosing, copying using or disseminating any of this information or taking any action in reliance on or regarding this information. If you have received this fax in error, please notify us immediately by telephone so that we can arrange for its return to [place]. Phone: [phone], Toll-Free: [phone], Fax: [phone] Page: 2 of 2 Call Id: [id number] Final Disposition 10/14/2022 12:37:15 PM SEE PCP WITHIN 3 DAYS Yes Lenon Curt, RN, Threasa Beards Caller Disagree/Comply Comply Caller Understands Yes PreDisposition Call Doctor Care Advice Given Per Guideline SEE PCP WITHIN 3 DAYS: * You need to be seen within 2 or 3 days. * PCP VISIT: Call your doctor (or NP/PA) during regular office hours and make an appointment. A clinic or urgent care center are good places to go for care if your doctor's office is closed or you can't get an appointment. NOTE: If office will be open tomorrow, tell caller to call then, not in 3 days. COUGH MEDICINES: CALL BACK IF: * Fever over 103 F (39.4 C) * Difficulty breathing occurs * You become worse CARE ADVICE given per Cough - Acute Non-Productive (Adult) guideline. Comments User: Erik Obey, RN Date/Time Eilene Ghazi Time): 10/14/2022 12:35:14 PM Walked on Treadmill last night and was fine. User: Erik Obey, RN Date/Time Eilene Ghazi Time): 10/14/2022 12:38:06 PM Recovers almost immediately, happens a few times a day. Has an appt for tomorrow morning booked alread

## 2022-10-14 NOTE — Telephone Encounter (Signed)
Noted, will evaluate as scheduled.  

## 2022-10-14 NOTE — Telephone Encounter (Signed)
Pt already has appt on 10/15/22 at 7:40 with Gentry Fitz NP. Sending note to Gentry Fitz NP and [facility].Marland Kitchen

## 2022-10-14 NOTE — Telephone Encounter (Signed)
Please see pt message also. Pt already has appt scheduled with Gentry Fitz NP on 10/15/22 at 7:40. Sending note to Gentry Fitz NP and [facility] pool.    [facility] - Client TELEPHONE ADVICE RECORD [facility] Patient Name: Debbie Bowen Gender: Female DOB: 12/22/1980 Age: 42 Y 78 M Return Phone Number: [phone] (Primary) Address: City/ State/ Zip: [place] [address] Client [facility] - Client Client Site [facility] - Day Provider Alma Friendly - NP Contact Type Call Who Is Calling Patient / Member / Family / Caregiver Call Type Triage / Clinical Relationship To Patient Self Return Phone Number [phone] (Primary) Chief Complaint Sore Throat Reason for Call Symptomatic / Request for [place] states she has a patient on the line who is congested, short of breath when she laughs as well as has a scratchy throat. Translation No Nurse Assessment Nurse: Lenon Curt, RN, Melanie Date/Time (Eastern Time): 10/14/2022 12:32:36 PM Confirm and document reason for call. If symptomatic, describe symptoms. ---Caller states has short of breath when laughs/coughs for 2 weeks, no CP. Mucinex didn't help much. Does the patient have any new or worsening symptoms? ---Yes Will a triage be completed? ---Yes Related visit to physician within the last 2 weeks? ---No Does the PT have any chronic conditions? (i.e. diabetes, asthma, this includes High risk factors for pregnancy, etc.) ---No Is the patient pregnant or possibly pregnant? (Ask all females between the ages of 73-55) ---No Is this a behavioral health or substance abuse call? ---No Guidelines Guideline Title Affirmed Question Affirmed Notes Nurse Date/Time (Eastern Time) Cough - Acute NonProductive [1] Nasal discharge AND [2] present > 10 days Allean Found 10/14/2022 12:34:11 PM Disp. Time  Eilene Ghazi Time) Disposition Final User 10/14/2022 12:05:59 PM Attempt made - message left Lenon Curt, RN, Threasa Beards 10/14/2022 12:37:15 PM SEE PCP WITHIN 3 DAYS Yes Lenon Curt, RN, Melanie PLEASE NOTE: All timestamps contained within this report are represented as [place] Standard Time. CONFIDENTIALTY NOTICE: This fax transmission is intended only for the addressee. It contains information that is legally privileged, confidential or otherwise protected from use or disclosure. If you are not the intended recipient, you are strictly prohibited from reviewing, disclosing, copying using or disseminating any of this information or taking any action in reliance on or regarding this information. If you have received this fax in error, please notify us immediately by telephone so that we can arrange for its return to [place]. Phone: [phone], Toll-Free: [phone], Fax: [phone] Page: 2 of 2 Call Id: [id number] Final Disposition 10/14/2022 12:37:15 PM SEE PCP WITHIN 3 DAYS Yes Lenon Curt, RN, Threasa Beards Caller Disagree/Comply Comply Caller Understands Yes PreDisposition Call Doctor Care Advice Given Per Guideline SEE PCP WITHIN 3 DAYS: * You need to be seen within 2 or 3 days. * PCP VISIT: Call your doctor (or NP/PA) during regular office hours and make an appointment. A clinic or urgent care center are good places to go for care if your doctor's office is closed or you can't get an appointment. NOTE: If office will be open tomorrow, tell caller to call then, not in 3 days. COUGH MEDICINES: CALL BACK IF: * Fever over 103 F (39.4 C) * Difficulty breathing occurs * You become worse CARE ADVICE given per Cough - Acute Non-Productive (Adult) guideline. Comments User: Erik Obey, RN Date/Time Eilene Ghazi Time): 10/14/2022 12:35:14 PM Walked on Treadmill last night and was fine. User: Erik Obey, RN Date/Time Eilene Ghazi Time):  10/14/2022 12:38:06 PM Recovers almost immediately, happens a few times a day. Has an appt for  tomorrow morning booked alread

## 2022-10-15 ENCOUNTER — Encounter: Payer: Self-pay | Admitting: Primary Care

## 2022-10-15 ENCOUNTER — Ambulatory Visit: Payer: 59 | Admitting: Primary Care

## 2022-10-15 VITALS — BP 110/74 | HR 75 | Temp 98.1°F | Ht 67.0 in | Wt 145.0 lb

## 2022-10-15 DIAGNOSIS — J3089 Other allergic rhinitis: Secondary | ICD-10-CM

## 2022-10-15 MED ORDER — ALBUTEROL SULFATE HFA 108 (90 BASE) MCG/ACT IN AERS: 1.0000 | INHALATION_SPRAY | Freq: Four times a day (QID) | RESPIRATORY_TRACT | 0 refills | Status: AC | PRN

## 2022-10-15 NOTE — Progress Notes (Signed)
Subjective:    Patient ID: Debbie Bowen, female    DOB: May 11, 1981, 42 y.o.   MRN: [id number]  Shortness of Breath Pertinent negatives include no fever or sore throat.    Shaneal Carleigh Bernards is a very pleasant 42 y.o. female with a history of allergic rhinitis, GERD, recurrent UTI who presents today to discuss shortness of breath.  Symptom onset about two days ago with post nasal drip. One week ago her husband noticed that she was breathing differently. Yesterday upon waking, she noticed difficulty taking in a deep breath, especially when laughing or coughing.   She's been taking Mucinex and a Cold and Congestion medication without improvement. She takes Zyrtec every other day already for chronic allergies.   She denies fevers, chills, sore throat. She's test negative for Covid-19 twice. Her husband doesn't have symptoms.   She is a not smoker and is not on birth control. She's been able to run and work out without difficulty. She denies asthma history.    Review of Systems  Constitutional:  Negative for chills, fatigue and fever.  HENT:  Positive for postnasal drip. Negative for congestion, sinus pressure and sore throat.   Respiratory:  Positive for cough and shortness of breath.   Allergic/Immunologic: Positive for environmental allergies.         Past Medical History:  Diagnosis Date   ADD (attention deficit disorder)    No medications   BV (bacterial vaginosis)    recurrent   Eczema    GERD (gastroesophageal reflux disease)    Insomnia    Orthodontics    invisalign   Scoliosis    no issues   Urticaria    UTI (lower urinary tract infection)    recurrent   Wears contact lenses     Social History   Socioeconomic History   Marital status: Married    Spouse name: Not on file   Number of children: Not on file   Years of education: 16   Highest education level: Not on file  Occupational History   Occupation: Sales promotion account executive    Comment:  [employer]  Tobacco Use   Smoking status: Never   Smokeless tobacco: Never  Vaping Use   Vaping Use: Never used  Substance and Sexual Activity   Alcohol use: Yes    Alcohol/week: 2.0 standard drinks of alcohol    Types: 2 Glasses of wine per week    Comment: occasionally   Drug use: No   Sexual activity: Yes    Partners: Male    Birth control/protection: None  Other Topics Concern   Not on file  Social History Narrative   Single.    No children.   Self Employed.   Enjoys projects around her house.    Social Determinants of Health   Financial Resource Strain: Not on file  Food Insecurity: Not on file  Transportation Needs: Not on file  Physical Activity: Not on file  Stress: Not on file  Social Connections: Not on file  Intimate Partner Violence: Not on file    Past Surgical History:  Procedure Laterality Date   CERVIX LESION DESTRUCTION     DILATION AND CURETTAGE OF UTERUS     for TAB   ESOPHAGOGASTRODUODENOSCOPY (EGD) WITH PROPOFOL N/A 10/04/2015   Procedure: ESOPHAGOGASTRODUODENOSCOPY (EGD) WITH PROPOFOL;  Surgeon: Lucilla Lame, MD;  Location: [place];  Service: Endoscopy;  Laterality: N/A;    Family History  Problem Relation Age of Onset   Diabetes Mother  Breast cancer Mother 33       Breast one side BRCA negative. Triple negative   Juvenile idiopathic arthritis Father    Eczema Father    Diabetes Maternal Aunt    Diabetes Paternal 31    Breast cancer Paternal Grandmother    Diabetes Paternal Grandmother    Asthma Sister    Asthma Brother    Diabetes Maternal Grandmother    Prostate cancer Maternal Grandfather    Prostate cancer Paternal Grandfather    Breast cancer Paternal Aunt 40   Breast cancer Paternal Aunt 42   Urticaria Neg Hx    Allergic rhinitis Neg Hx    Angioedema Neg Hx    Immunodeficiency Neg Hx     No Known Allergies  Current Outpatient Medications on File Prior to Visit  Medication Sig Dispense Refill    nitrofurantoin (MACRODANTIN) 50 MG capsule Take 1 capsule (50 mg total) by mouth as needed. For UTI prevention. 30 capsule 0   Prenatal Multivit-Min-Fe-FA (PRENATAL, W/IRON & FA,) 27-0.8 MG TABS Take 1 tablet by mouth daily.     ibuprofen (ADVIL) 200 MG tablet Take 200 mg by mouth as needed. (Patient not taking: Reported on 10/15/2022)     No current facility-administered medications on file prior to visit.    BP 110/74   Pulse 75   Temp 98.1 F (36.7 C) (Temporal)   Ht 5' 7"$  (1.702 m)   Wt 145 lb (65.8 kg)   LMP 09/30/2022 (Exact Date)   SpO2 98%   BMI 22.71 kg/m  Objective:   Physical Exam HENT:     Right Ear: Tympanic membrane and ear canal normal.     Left Ear: Tympanic membrane and ear canal normal.     Nose:     Right Sinus: No maxillary sinus tenderness or frontal sinus tenderness.     Left Sinus: No maxillary sinus tenderness or frontal sinus tenderness.     Mouth/Throat:     Pharynx: No posterior oropharyngeal erythema.  Eyes:     Conjunctiva/sclera: Conjunctivae normal.  Cardiovascular:     Rate and Rhythm: Normal rate and regular rhythm.  Pulmonary:     Effort: Pulmonary effort is normal.     Breath sounds: Normal breath sounds. No wheezing or rales.  Musculoskeletal:     Cervical back: Neck supple.  Lymphadenopathy:     Cervical: No cervical adenopathy.  Skin:    General: Skin is warm and dry.           Assessment & Plan:  Perennial allergic rhinitis Assessment & Plan: Symptoms representative of allergy involvement. She does not appear acutely ill and feels well.  Discussed to take Zyrtec daily for now (rather than every other day). Rx for albuterol inhaler provided given her allergy history.   She will update if no improvement.   Orders: -     Albuterol Sulfate HFA; Inhale 1-2 puffs into the lungs every 6 (six) hours as needed for shortness of breath.  Dispense: 1 each; Refill: 0        Pleas Koch, NP

## 2022-10-15 NOTE — Patient Instructions (Signed)
Start Zyrtec 10 mg daily for allergies.  Shortness of Breath/Wheezing/Cough: Use the albuterol inhaler. Inhale 2 puffs into the lungs every 4 to 6 hours as needed for wheezing, cough, and/or shortness of breath.   It was a pleasure to see you today!

## 2022-10-15 NOTE — Assessment & Plan Note (Signed)
Symptoms representative of allergy involvement. She does not appear acutely ill and feels well.  Discussed to take Zyrtec daily for now (rather than every other day). Rx for albuterol inhaler provided given her allergy history.   She will update if no improvement.

## 2022-10-16 ENCOUNTER — Ambulatory Visit: Payer: 59 | Admitting: Primary Care

## 2022-11-19 ENCOUNTER — Ambulatory Visit: Payer: 59 | Admitting: Family Medicine

## 2022-12-02 ENCOUNTER — Telehealth: Payer: Self-pay | Admitting: Primary Care

## 2022-12-02 DIAGNOSIS — N390 Urinary tract infection, site not specified: Secondary | ICD-10-CM

## 2022-12-02 MED ORDER — NITROFURANTOIN MACROCRYSTAL 50 MG PO CAPS: 50.0000 mg | ORAL_CAPSULE | ORAL | 0 refills | Status: DC | PRN

## 2022-12-02 NOTE — Telephone Encounter (Signed)
Refills sent to pharmacy. 

## 2022-12-02 NOTE — Telephone Encounter (Signed)
After speaking with Debbie Bossier NP patient was advised that she should probably not wait until Thursday. Patient was advised that Debbie Bowen's schedule is full tomorrow and she suggested that she be seen by another provider in the office. Patient scheduled to see Debbie Bowen tomorrow 12/03/22 at  11:15. Patient's appointment for Thursday with Debbie Bowen cancelled. Patient was given ER precautions and she verbalized understanding.

## 2022-12-02 NOTE — Telephone Encounter (Signed)
Spoke to patient by telephone and was advised that she had a similar pain a couple of months ago but only lasted a short time. Patient stated that the pain is constant and the area below her belly button is sore to the touch and hurts when she sits a certain way. Patient denies nausea, diarrhea or a fever. Patient stated when she touches the area she does not feel a knot or a lump. Patient stated that she has scheduled an appointment Thursday but not sure that she should wait that long. Patient stated that they did offer her an appointment with another provider but would prefer to see Anda Kraft. Patient stated if Anda Kraft feels that she needs to see another provider here sooner she will. Patient was given ER precautions and she verbalized understanding.

## 2022-12-02 NOTE — Telephone Encounter (Signed)
Noted  

## 2022-12-02 NOTE — Telephone Encounter (Signed)
Prescription Request  12/02/2022  LOV: 10/15/2022  What is the name of the medication or equipment?  nitrofurantoin (MACRODANTIN) 50 MG capsule   Have you contacted your pharmacy to request a refill? No   Which pharmacy would you like this sent to?  [id number] [address], [place] [address] [place] [address] Phone: [phone] Fax: [phone]     Patient notified that their request is being sent to the clinical staff for review and that they should receive a response within 2 business days.   Please advise at Mobile [phone] (mobile)

## 2022-12-03 ENCOUNTER — Ambulatory Visit: Payer: 59 | Admitting: Internal Medicine

## 2022-12-03 ENCOUNTER — Encounter: Payer: Self-pay | Admitting: Internal Medicine

## 2022-12-03 VITALS — BP 118/72 | HR 64 | Temp 98.7°F | Ht 67.0 in | Wt 148.8 lb

## 2022-12-03 DIAGNOSIS — N3000 Acute cystitis without hematuria: Secondary | ICD-10-CM | POA: Insufficient documentation

## 2022-12-03 DIAGNOSIS — N390 Urinary tract infection, site not specified: Secondary | ICD-10-CM | POA: Diagnosis not present

## 2022-12-03 LAB — POCT URINALYSIS DIPSTICK
Bilirubin, UA: NEGATIVE
Blood, UA: POSITIVE
Glucose, UA: NEGATIVE
Ketones, UA: NEGATIVE
Leukocytes, UA: NEGATIVE
Nitrite, UA: NEGATIVE
Protein, UA: NEGATIVE
Spec Grav, UA: >=1.03 — AB (ref 1.010–1.025)
Urobilinogen, UA: 0.2 E.U./dL
pH, UA: 5.5 (ref 5.0–8.0)

## 2022-12-03 MED ORDER — AMOXICILLIN 500 MG PO TABS: 1000.0000 mg | ORAL_TABLET | Freq: Two times a day (BID) | ORAL | 0 refills | Status: AC

## 2022-12-03 NOTE — Assessment & Plan Note (Addendum)
Does seem to have bladder infection after missing her post coital prophylaxis Urinalysis fairly normal--but had a dose of antibiotic Won't do culture since she did take 1 dose of the nitrofurantoin that she found Will give amoxil 1000 bid  x 3 days and recheck for persistent symptoms (last culture was Enterococcus)

## 2022-12-03 NOTE — Progress Notes (Signed)
Subjective:    Patient ID: Debbie Bowen, female    DOB: 02-28-1981, 42 y.o.   MRN: [id number]  HPI Here due to lower abdominal pain  Started with lower abdominal pain since yesterday Felt sharp just below umbilicus and would feel it with bending over Had run out of nitrofurantoin over a week ago--post coital preventative  Then had dysuria and pressure feeling yesterday---did find one ntrofurantoin  Now that is better No blood or frequency/urgency  Current Outpatient Medications on File Prior to Visit  Medication Sig Dispense Refill   albuterol (VENTOLIN HFA) 108 (90 Base) MCG/ACT inhaler Inhale 1-2 puffs into the lungs every 6 (six) hours as needed for shortness of breath. 1 each 0   ibuprofen (ADVIL) 200 MG tablet Take 200 mg by mouth as needed.     nitrofurantoin (MACRODANTIN) 50 MG capsule Take 1 capsule (50 mg total) by mouth as needed. For UTI prevention. 30 capsule 0   Prenatal Multivit-Min-Fe-FA (PRENATAL, W/IRON & FA,) 27-0.8 MG TABS Take 1 tablet by mouth daily.     No current facility-administered medications on file prior to visit.    No Known Allergies  Past Medical History:  Diagnosis Date   ADD (attention deficit disorder)    No medications   BV (bacterial vaginosis)    recurrent   Eczema    GERD (gastroesophageal reflux disease)    Insomnia    Orthodontics    invisalign   Scoliosis    no issues   Urticaria    UTI (lower urinary tract infection)    recurrent   Wears contact lenses     Past Surgical History:  Procedure Laterality Date   CERVIX LESION DESTRUCTION     DILATION AND CURETTAGE OF UTERUS     for TAB   ESOPHAGOGASTRODUODENOSCOPY (EGD) WITH PROPOFOL N/A 10/04/2015   Procedure: ESOPHAGOGASTRODUODENOSCOPY (EGD) WITH PROPOFOL;  Surgeon: Lucilla Lame, MD;  Location: [facility];  Service: Endoscopy;  Laterality: N/A;    Family History  Problem Relation Age of Onset   Diabetes Mother    Breast cancer Mother 70        Breast one side BRCA negative. Triple negative   Juvenile idiopathic arthritis Father    Eczema Father    Diabetes Maternal Aunt    Diabetes Paternal 80    Breast cancer Paternal Grandmother    Diabetes Paternal Grandmother    Asthma Sister    Asthma Brother    Diabetes Maternal Grandmother    Prostate cancer Maternal Grandfather    Prostate cancer Paternal Grandfather    Breast cancer Paternal Aunt 53   Breast cancer Paternal Aunt 46   Urticaria Neg Hx    Allergic rhinitis Neg Hx    Angioedema Neg Hx    Immunodeficiency Neg Hx     Social History   Socioeconomic History   Marital status: Married    Spouse name: Not on file   Number of children: Not on file   Years of education: 16   Highest education level: Bachelor's degree (e.g., BA, AB, BS)  Occupational History   Occupation: Sales promotion account executive    Comment: [employer]  Tobacco Use   Smoking status: Never   Smokeless tobacco: Never  Vaping Use   Vaping Use: Never used  Substance and Sexual Activity   Alcohol use: Yes    Alcohol/week: 2.0 standard drinks of alcohol    Types: 2 Glasses of wine per week    Comment: occasionally   Drug  use: No   Sexual activity: Yes    Partners: Male    Birth control/protection: None  Other Topics Concern   Not on file  Social History Narrative   Single.    No children.   Self Employed.   Enjoys projects around her house.    Social Determinants of Health   Financial Resource Strain: Low Risk  (12/02/2022)   Overall Financial Resource Strain (CARDIA)    Difficulty of Paying Living Expenses: Not hard at all  Food Insecurity: No Food Insecurity (12/02/2022)   Hunger Vital Sign    Worried About Running Out of Food in the Last Year: Never true    Ran Out of Food in the Last Year: Never true  Transportation Needs: No Transportation Needs (12/02/2022)   PRAPARE - Hydrologist (Medical): No    Lack of Transportation (Non-Medical): No  Physical  Activity: Sufficiently Active (12/02/2022)   Exercise Vital Sign    Days of Exercise per Week: 3 days    Minutes of Exercise per Session: 60 min  Stress: No Stress Concern Present (12/02/2022)   [place]    Feeling of Stress : Not at all  Social Connections: Moderately Integrated (12/02/2022)   Social Connection and Isolation Panel [NHANES]    Frequency of Communication with Friends and Family: More than three times a week    Frequency of Social Gatherings with Friends and Family: Once a week    Attends Religious Services: 1 to 4 times per year    Active Member of [employer] or Organizations: No    Attends Music therapist: Not on file    Marital Status: Married  Human resources officer Violence: Not on file   Review of Systems No fever No N/V Bowels are slow---nothing serious Appetite is fine     Objective:   Physical Exam Abdominal:     General: Bowel sounds are normal. There is no distension.     Palpations: Abdomen is soft.     Tenderness: There is no right CVA tenderness, left CVA tenderness or rebound.     Comments: Slight suprapubic tenderness Murphy's sign absent            Assessment & Plan:

## 2022-12-04 ENCOUNTER — Ambulatory Visit: Payer: 59 | Admitting: Primary Care

## 2023-01-06 ENCOUNTER — Encounter: Payer: Self-pay | Admitting: Primary Care

## 2023-01-06 ENCOUNTER — Ambulatory Visit (INDEPENDENT_AMBULATORY_CARE_PROVIDER_SITE_OTHER): Payer: 59 | Admitting: Primary Care

## 2023-01-06 VITALS — BP 118/72 | HR 72 | Temp 98.1°F | Ht 67.0 in | Wt 149.0 lb

## 2023-01-06 DIAGNOSIS — M25532 Pain in left wrist: Secondary | ICD-10-CM

## 2023-01-06 HISTORY — DX: Pain in left wrist: M25.532

## 2023-01-06 NOTE — Progress Notes (Signed)
Subjective:    Patient ID: Debbie Bowen, female    DOB: 28-Nov-1980, 42 y.o.   MRN: [id number]  HPI  Debbie Bowen is a very pleasant 42 y.o. female with a history of recurrent UTI, hyperlipidemia, chronic back pain, chronic hip pain, who presents today to discuss wrist pain.   Symptom onset two days ago to the left lateral wrist and proximal wrist. She also noticed pain to the left thumb with any range of motion movements, unable to move left wrist and thumb. She took Ibuprofen two evenings ago and noticed improvement yesterday morning upon waking, until yesterday afternoon.   She denies injury/trauma, numbness, radiation of pain, overuse. She was working in the garden a few days prior to symptom onset, used her right hand mostly. Today she's feeling better, is able to move her wrist and thumb, has mild pain. Her husband made her a brace that she wore temporarily.    Review of Systems  Musculoskeletal:  Positive for arthralgias. Negative for joint swelling.  Skin:  Negative for color change.  Neurological:  Negative for weakness and numbness.         Past Medical History:  Diagnosis Date   ADD (attention deficit disorder)    No medications   BV (bacterial vaginosis)    recurrent   Eczema    GERD (gastroesophageal reflux disease)    Insomnia    Orthodontics    invisalign   Scoliosis    no issues   Urticaria    UTI (lower urinary tract infection)    recurrent   Wears contact lenses     Social History   Socioeconomic History   Marital status: Married    Spouse name: Not on file   Number of children: Not on file   Years of education: 16   Highest education level: Bachelor's degree (e.g., BA, AB, BS)  Occupational History   Occupation: Nature conservation officer    Comment: [employer]  Tobacco Use   Smoking status: Never   Smokeless tobacco: Never  Vaping Use   Vaping Use: Never used  Substance and Sexual Activity   Alcohol use: Yes     Alcohol/week: 2.0 standard drinks of alcohol    Types: 2 Glasses of wine per week    Comment: occasionally   Drug use: No   Sexual activity: Yes    Partners: Male    Birth control/protection: None  Other Topics Concern   Not on file  Social History Narrative   Single.    No children.   Self Employed.   Enjoys projects around her house.    Social Determinants of Health   Financial Resource Strain: Low Risk  (12/02/2022)   Overall Financial Resource Strain (CARDIA)    Difficulty of Paying Living Expenses: Not hard at all  Food Insecurity: No Food Insecurity (12/02/2022)   Hunger Vital Sign    Worried About Running Out of Food in the Last Year: Never true    Ran Out of Food in the Last Year: Never true  Transportation Needs: No Transportation Needs (12/02/2022)   PRAPARE - Administrator, Civil Service (Medical): No    Lack of Transportation (Non-Medical): No  Physical Activity: Sufficiently Active (12/02/2022)   Exercise Vital Sign    Days of Exercise per Week: 3 days    Minutes of Exercise per Session: 60 min  Stress: No Stress Concern Present (12/02/2022)   [employer] of Occupational Health - Occupational Stress Questionnaire  Feeling of Stress : Not at all  Social Connections: Moderately Integrated (12/02/2022)   Social Connection and Isolation Panel [NHANES]    Frequency of Communication with Friends and Family: More than three times a week    Frequency of Social Gatherings with Friends and Family: Once a week    Attends Religious Services: 1 to 4 times per year    Active Member of [employer] or Organizations: No    Attends Engineer, structural: Not on file    Marital Status: Married  Catering manager Violence: Not on file    Past Surgical History:  Procedure Laterality Date   CERVIX LESION DESTRUCTION     DILATION AND CURETTAGE OF UTERUS     for TAB   ESOPHAGOGASTRODUODENOSCOPY (EGD) WITH PROPOFOL N/A 10/04/2015   Procedure: ESOPHAGOGASTRODUODENOSCOPY  (EGD) WITH PROPOFOL;  Surgeon: Midge Minium, MD;  Location: [facility];  Service: Endoscopy;  Laterality: N/A;    Family History  Problem Relation Age of Onset   Diabetes Mother    Breast cancer Mother 45       Breast one side BRCA negative. Triple negative   Juvenile idiopathic arthritis Father    Eczema Father    Diabetes Maternal Aunt    Diabetes Paternal Aunt    Breast cancer Paternal Grandmother    Diabetes Paternal Grandmother    Asthma Sister    Asthma Brother    Diabetes Maternal Grandmother    Prostate cancer Maternal Grandfather    Prostate cancer Paternal Grandfather    Breast cancer Paternal Aunt 58   Breast cancer Paternal Aunt 74   Urticaria Neg Hx    Allergic rhinitis Neg Hx    Angioedema Neg Hx    Immunodeficiency Neg Hx     No Known Allergies  Current Outpatient Medications on File Prior to Visit  Medication Sig Dispense Refill   ibuprofen (ADVIL) 200 MG tablet Take 200 mg by mouth as needed.     nitrofurantoin (MACRODANTIN) 50 MG capsule Take 1 capsule (50 mg total) by mouth as needed. For UTI prevention. 30 capsule 0   albuterol (VENTOLIN HFA) 108 (90 Base) MCG/ACT inhaler Inhale 1-2 puffs into the lungs every 6 (six) hours as needed for shortness of breath. (Patient not taking: Reported on 01/06/2023) 1 each 0   Prenatal Multivit-Min-Fe-FA (PRENATAL, W/IRON & FA,) 27-0.8 MG TABS Take 1 tablet by mouth daily. (Patient not taking: Reported on 01/06/2023)     No current facility-administered medications on file prior to visit.    BP 118/72   Pulse 72   Temp 98.1 F (36.7 C) (Temporal)   Ht 5\' 7"  (1.702 m)   Wt 149 lb (67.6 kg)   SpO2 99%   BMI 23.34 kg/m  Objective:   Physical Exam Constitutional:      General: She is not in acute distress. Pulmonary:     Effort: Pulmonary effort is normal.  Musculoskeletal:     Right wrist: No swelling or deformity.     Left wrist: No swelling, deformity, tenderness or bony tenderness. Normal range of  motion. Normal pulse.  Neurological:     Mental Status: She is alert.           Assessment & Plan:  Acute wrist pain, left Assessment & Plan: Likely tendonitis, improved now. Discussed continued conservative treatment including NSAID's, bracing, rest.  She will update if symptoms increase.  Consider sports medicine evaluation.          Doreene Nest, NP

## 2023-01-06 NOTE — Assessment & Plan Note (Signed)
Likely tendonitis, improved now. Discussed continued conservative treatment including NSAID's, bracing, rest.  She will update if symptoms increase.  Consider sports medicine evaluation.

## 2023-01-06 NOTE — Patient Instructions (Signed)
Resume Ibuprofen for pain and inflammation as needed.  Use a brace if needed.  Please update me if symptoms return.  It was a pleasure to see you today!

## 2023-03-02 ENCOUNTER — Other Ambulatory Visit: Payer: Self-pay | Admitting: Primary Care

## 2023-03-02 DIAGNOSIS — N390 Urinary tract infection, site not specified: Secondary | ICD-10-CM

## 2023-03-02 MED ORDER — NITROFURANTOIN MACROCRYSTAL 50 MG PO CAPS: 50.0000 mg | ORAL_CAPSULE | ORAL | 0 refills | Status: DC | PRN

## 2023-03-02 NOTE — Telephone Encounter (Signed)
From: [name] To: Office of Doreene Nest, NP Sent: 03/02/2023 11:53 AM EDT Subject: Medication Renewal Request  Refills have been requested for the following medications:   nitrofurantoin (MACRODANTIN) 50 MG capsule [Jashley Yellin K Dannell Gortney]  [facility] pharmacy: [id number], [place] - [address] Delivery method: Daryll Drown

## 2023-05-14 ENCOUNTER — Encounter: Payer: 59 | Admitting: Primary Care

## 2023-05-26 ENCOUNTER — Encounter: Payer: Self-pay | Admitting: Primary Care

## 2023-05-26 ENCOUNTER — Ambulatory Visit (INDEPENDENT_AMBULATORY_CARE_PROVIDER_SITE_OTHER): Payer: 59 | Admitting: Primary Care

## 2023-05-26 VITALS — BP 110/72 | HR 77 | Temp 97.5°F | Ht 67.0 in | Wt 150.0 lb

## 2023-05-26 DIAGNOSIS — N390 Urinary tract infection, site not specified: Secondary | ICD-10-CM

## 2023-05-26 DIAGNOSIS — M545 Low back pain, unspecified: Secondary | ICD-10-CM | POA: Diagnosis not present

## 2023-05-26 DIAGNOSIS — R0683 Snoring: Secondary | ICD-10-CM

## 2023-05-26 DIAGNOSIS — M25551 Pain in right hip: Secondary | ICD-10-CM | POA: Diagnosis not present

## 2023-05-26 DIAGNOSIS — E785 Hyperlipidemia, unspecified: Secondary | ICD-10-CM | POA: Diagnosis not present

## 2023-05-26 DIAGNOSIS — Z8481 Family history of carrier of genetic disease: Secondary | ICD-10-CM

## 2023-05-26 DIAGNOSIS — K219 Gastro-esophageal reflux disease without esophagitis: Secondary | ICD-10-CM | POA: Diagnosis not present

## 2023-05-26 DIAGNOSIS — G8929 Other chronic pain: Secondary | ICD-10-CM

## 2023-05-26 DIAGNOSIS — Z0000 Encounter for general adult medical examination without abnormal findings: Secondary | ICD-10-CM | POA: Diagnosis not present

## 2023-05-26 DIAGNOSIS — E538 Deficiency of other specified B group vitamins: Secondary | ICD-10-CM

## 2023-05-26 DIAGNOSIS — F411 Generalized anxiety disorder: Secondary | ICD-10-CM

## 2023-05-26 NOTE — Patient Instructions (Signed)
Stop by the lab prior to leaving today. I will notify you of your results once received.   Complete your mammogram.  It was a pleasure to see you today!

## 2023-05-26 NOTE — Assessment & Plan Note (Signed)
Improved with regular exercise. Continue to monitor.

## 2023-05-26 NOTE — Assessment & Plan Note (Signed)
Worse last year. Also with elevated Lipoprotein A, discussed this today.  She would like to work on lifestyle management. If LDL >190 then recommend statin therapy. She agrees.  Await results.

## 2023-05-26 NOTE — Assessment & Plan Note (Signed)
Controlled with diet. Continue to monitor.

## 2023-05-26 NOTE — Assessment & Plan Note (Signed)
Sleep apnea ruled out.

## 2023-05-26 NOTE — Assessment & Plan Note (Signed)
Active, overall controlled per patient. Continue regular therapy.  Continue to monitor.

## 2023-05-26 NOTE — Assessment & Plan Note (Signed)
Mammogram due. She is aware and scheduling.

## 2023-05-26 NOTE — Progress Notes (Signed)
Subjective:    Patient ID: Debbie Bowen, female    DOB: [birth year], 43 y.o.   MRN: [id number]  HPI  Debbie Bowen is a very pleasant 42 y.o. female who presents today for complete physical and follow up of chronic conditions.  Immunizations: -Tetanus: Completed in 2023 -Influenza: Declines influenza vaccine.   Diet: Fair diet.  Exercise: No regular exercise over the last 2 months  Eye exam: Completes annually  Dental exam: Completes every 3 months  Pap Smear: August 2023 Mammogram: August 2023, scheduling now.  BP Readings from Last 3 Encounters:  05/26/23 110/72  01/06/23 118/72  12/03/22 118/72       Review of Systems  Constitutional:  Positive for fatigue. Negative for unexpected weight change.  HENT:  Negative for rhinorrhea.   Respiratory:  Negative for cough and shortness of breath.   Cardiovascular:  Negative for chest pain.  Gastrointestinal:  Negative for constipation and diarrhea.  Genitourinary:  Negative for difficulty urinating and menstrual problem.  Musculoskeletal:  Negative for arthralgias and myalgias.  Skin:  Negative for rash.  Allergic/Immunologic: Negative for environmental allergies.  Neurological:  Negative for dizziness and headaches.         Past Medical History:  Diagnosis Date   Acute knee pain 07/18/2022   Acute wrist pain, left 01/06/2023   ADD (attention deficit disorder)    No medications   BV (bacterial vaginosis)    recurrent   Communication problem 03/20/2020   Eczema    Facial pain 09/22/2019   GERD (gastroesophageal reflux disease)    Insomnia    Orthodontics    invisalign   Pain in thumb joint with movement of right hand 07/11/2021   Scoliosis    no issues   Urticaria    UTI (lower urinary tract infection)    recurrent   Wears contact lenses     Social History   Socioeconomic History   Marital status: Married    Spouse name: Not on file   Number of children: Not on file   Years  of education: 16   Highest education level: Bachelor's degree (e.g., BA, AB, BS)  Occupational History   Occupation: Nature conservation officer    Comment: [employer]  Tobacco Use   Smoking status: Never   Smokeless tobacco: Never  Vaping Use   Vaping status: Never Used  Substance and Sexual Activity   Alcohol use: Yes    Alcohol/week: 2.0 standard drinks of alcohol    Types: 2 Glasses of wine per week    Comment: occasionally   Drug use: No   Sexual activity: Yes    Partners: Male    Birth control/protection: None  Other Topics Concern   Not on file  Social History Narrative   Single.    No children.   Self Employed.   Enjoys projects around her house.    Social Determinants of Health   Financial Resource Strain: Low Risk  (12/02/2022)   Overall Financial Resource Strain (CARDIA)    Difficulty of Paying Living Expenses: Not hard at all  Food Insecurity: No Food Insecurity (12/02/2022)   Hunger Vital Sign    Worried About Running Out of Food in the Last Year: Never true    Ran Out of Food in the Last Year: Never true  Transportation Needs: No Transportation Needs (12/02/2022)   PRAPARE - Administrator, Civil Service (Medical): No    Lack of Transportation (Non-Medical): No  Physical Activity: Sufficiently Active (  12/02/2022)   Exercise Vital Sign    Days of Exercise per Week: 3 days    Minutes of Exercise per Session: 60 min  Stress: No Stress Concern Present (12/02/2022)   [employer] of Occupational Health - Occupational Stress Questionnaire    Feeling of Stress : Not at all  Social Connections: Moderately Integrated (12/02/2022)   Social Connection and Isolation Panel [NHANES]    Frequency of Communication with Friends and Family: More than three times a week    Frequency of Social Gatherings with Friends and Family: Once a week    Attends Religious Services: 1 to 4 times per year    Active Member of [employer] or Organizations: No    Attends Museum/gallery exhibitions officer: Not on file    Marital Status: Married  Catering manager Violence: Not on file    Past Surgical History:  Procedure Laterality Date   CERVIX LESION DESTRUCTION     DILATION AND CURETTAGE OF UTERUS     for TAB   ESOPHAGOGASTRODUODENOSCOPY (EGD) WITH PROPOFOL N/A 10/04/2015   Procedure: ESOPHAGOGASTRODUODENOSCOPY (EGD) WITH PROPOFOL;  Surgeon: Midge Minium, MD;  Location: [facility];  Service: Endoscopy;  Laterality: N/A;    Family History  Problem Relation Age of Onset   Diabetes Mother    Breast cancer Mother 29       Breast one side BRCA negative. Triple negative   Juvenile idiopathic arthritis Father    Eczema Father    Diabetes Maternal Aunt    Diabetes Paternal Aunt    Breast cancer Paternal Grandmother    Diabetes Paternal Grandmother    Asthma Sister    Asthma Brother    Diabetes Maternal Grandmother    Prostate cancer Maternal Grandfather    Prostate cancer Paternal Grandfather    Breast cancer Paternal Aunt 10   Breast cancer Paternal Aunt 7   Urticaria Neg Hx    Allergic rhinitis Neg Hx    Angioedema Neg Hx    Immunodeficiency Neg Hx     No Known Allergies  Current Outpatient Medications on File Prior to Visit  Medication Sig Dispense Refill   albuterol (VENTOLIN HFA) 108 (90 Base) MCG/ACT inhaler Inhale 1-2 puffs into the lungs every 6 (six) hours as needed for shortness of breath. 1 each 0   ibuprofen (ADVIL) 200 MG tablet Take 200 mg by mouth as needed.     nitrofurantoin (MACRODANTIN) 50 MG capsule Take 1 capsule (50 mg total) by mouth as needed. For UTI prevention. (Patient not taking: Reported on 05/26/2023) 30 capsule 0   Prenatal Multivit-Min-Fe-FA (PRENATAL, W/IRON & FA,) 27-0.8 MG TABS Take 1 tablet by mouth daily. (Patient not taking: Reported on 01/06/2023)     No current facility-administered medications on file prior to visit.    BP 110/72   Pulse 77   Temp (!) 97.5 F (36.4 C) (Temporal)   Ht 5\' 7"  (1.702 m)    Wt 150 lb (68 kg)   LMP 05/01/2023   SpO2 100%   BMI 23.49 kg/m  Objective:   Physical Exam HENT:     Right Ear: Tympanic membrane and ear canal normal.     Left Ear: Tympanic membrane and ear canal normal.     Nose: Nose normal.  Eyes:     Conjunctiva/sclera: Conjunctivae normal.     Pupils: Pupils are equal, round, and reactive to light.  Neck:     Thyroid: No thyromegaly.  Cardiovascular:     Rate and  Rhythm: Normal rate and regular rhythm.     Heart sounds: No murmur heard. Pulmonary:     Effort: Pulmonary effort is normal.     Breath sounds: Normal breath sounds. No rales.  Abdominal:     General: Bowel sounds are normal.     Palpations: Abdomen is soft.     Tenderness: There is no abdominal tenderness.  Musculoskeletal:        General: Normal range of motion.     Cervical back: Neck supple.  Lymphadenopathy:     Cervical: No cervical adenopathy.  Skin:    General: Skin is warm and dry.     Findings: No rash.  Neurological:     Mental Status: She is alert and oriented to person, place, and time.     Cranial Nerves: No cranial nerve deficit.     Deep Tendon Reflexes: Reflexes are normal and symmetric.  Psychiatric:        Mood and Affect: Mood normal.           Assessment & Plan:  Preventative health care Assessment & Plan: Immunizations UTD. Declines influenza vaccine.  Pap smear UTD. Mammogram due, she is scheduling.   Discussed the importance of a healthy diet and regular exercise in order for weight loss, and to reduce the risk of further co-morbidity.  Exam stable. Labs pending.  Follow up in 1 year for repeat physical.    Gastroesophageal reflux disease, unspecified whether esophagitis present Assessment & Plan: Controlled with diet. Continue to monitor.    Chronic bilateral low back pain without sciatica Assessment & Plan: Improved with regular exercise. Continue to monitor.    Chronic pain of right hip Assessment &  Plan: Controlled with regular exercise.  Continue to monitor.    GAD (generalized anxiety disorder) Assessment & Plan: Active, overall controlled per patient. Continue regular therapy.  Continue to monitor.    Snoring Assessment & Plan: Sleep apnea ruled out.   Family history of breast cancer gene mutation in first degree relative Assessment & Plan: Mammogram due. She is aware and scheduling.    Hyperlipidemia, unspecified hyperlipidemia type Assessment & Plan: Worse last year. Also with elevated Lipoprotein A, discussed this today.  She would like to work on lifestyle management. If LDL >190 then recommend statin therapy. She agrees.  Await results.   Orders: -     Lipid panel -     Comprehensive metabolic panel  Recurrent UTI Assessment & Plan: Overall controlled.  Continue nitrofurantion 50 mg PRN   Vitamin B12 deficiency -     Vitamin B12        Doreene Nest, NP

## 2023-05-26 NOTE — Assessment & Plan Note (Signed)
Controlled with regular exercise.  Continue to monitor.

## 2023-05-26 NOTE — Assessment & Plan Note (Signed)
Immunizations UTD. Declines influenza vaccine.  Pap smear UTD. Mammogram due, she is scheduling.   Discussed the importance of a healthy diet and regular exercise in order for weight loss, and to reduce the risk of further co-morbidity.  Exam stable. Labs pending.  Follow up in 1 year for repeat physical.

## 2023-05-26 NOTE — Assessment & Plan Note (Signed)
Overall controlled.  Continue nitrofurantion 50 mg PRN

## 2023-05-27 LAB — LIPID PANEL
Cholesterol: 254 mg/dL — ABNORMAL HIGH (ref 0–200)
HDL: 52.5 mg/dL (ref >39.00)
LDL Cholesterol: 167 mg/dL — ABNORMAL HIGH (ref 0–99)
NonHDL: 201.41
Total CHOL/HDL Ratio: 5
Triglycerides: 170 mg/dL — ABNORMAL HIGH (ref 0.0–149.0)
VLDL: 34 mg/dL (ref 0.0–40.0)

## 2023-05-27 LAB — VITAMIN B12: Vitamin B-12: 246 pg/mL (ref 211–911)

## 2023-05-27 LAB — COMPREHENSIVE METABOLIC PANEL
ALT: 11 U/L (ref 0–35)
AST: 15 U/L (ref 0–37)
Albumin: 4.2 g/dL (ref 3.5–5.2)
Alkaline Phosphatase: 49 U/L (ref 39–117)
BUN: 11 mg/dL (ref 6–23)
CO2: 28 mEq/L (ref 19–32)
Calcium: 9.5 mg/dL (ref 8.4–10.5)
Chloride: 101 mEq/L (ref 96–112)
Creatinine, Ser: 0.78 mg/dL (ref 0.40–1.20)
GFR: 93.39 mL/min (ref >60.00)
Glucose, Bld: 84 mg/dL (ref 70–99)
Potassium: 4 mEq/L (ref 3.5–5.1)
Sodium: 136 mEq/L (ref 135–145)
Total Bilirubin: 0.3 mg/dL (ref 0.2–1.2)
Total Protein: 8.2 g/dL (ref 6.0–8.3)

## 2023-06-21 NOTE — Telephone Encounter (Signed)
Can we abstract the breast US? See attachment

## 2023-06-26 ENCOUNTER — Other Ambulatory Visit: Payer: Self-pay | Admitting: Medical Genetics

## 2023-06-26 DIAGNOSIS — Z006 Encounter for examination for normal comparison and control in clinical research program: Secondary | ICD-10-CM

## 2023-07-14 ENCOUNTER — Other Ambulatory Visit: Payer: 59

## 2023-07-16 ENCOUNTER — Other Ambulatory Visit: Payer: Self-pay | Attending: Medical Genetics

## 2023-07-28 NOTE — Telephone Encounter (Signed)
Please have patient scheduled for office visit for symptoms.

## 2023-08-05 ENCOUNTER — Telehealth: Payer: Self-pay | Admitting: Primary Care

## 2023-08-05 DIAGNOSIS — N390 Urinary tract infection, site not specified: Secondary | ICD-10-CM

## 2023-08-05 MED ORDER — NITROFURANTOIN MACROCRYSTAL 50 MG PO CAPS
50.0000 mg | ORAL_CAPSULE | ORAL | 0 refills | Status: DC | PRN
Start: 2023-08-05 — End: 2024-02-23

## 2023-08-05 NOTE — Telephone Encounter (Signed)
Prescription Request  08/05/2023  LOV: 05/26/2023  What is the name of the medication or equipment? nitrofurantoin (MACRODANTIN) 50 MG capsule   Have you contacted your pharmacy to request a refill? Yes   Which pharmacy would you like this sent to?  [id number], [place] - [address] [place] [address] Phone: [phone] Fax: [phone]    Patient notified that their request is being sent to the clinical staff for review and that they should receive a response within 2 business days.   Please advise at Mobile [phone] (mobile)

## 2023-08-05 NOTE — Telephone Encounter (Signed)
Refills sent to pharmacy. 

## 2023-08-06 ENCOUNTER — Ambulatory Visit: Payer: 59 | Admitting: Primary Care

## 2023-09-08 DIAGNOSIS — F411 Generalized anxiety disorder: Secondary | ICD-10-CM | POA: Diagnosis not present

## 2023-09-08 DIAGNOSIS — F900 Attention-deficit hyperactivity disorder, predominantly inattentive type: Secondary | ICD-10-CM | POA: Diagnosis not present

## 2023-09-09 ENCOUNTER — Ambulatory Visit: Payer: 59 | Admitting: Family Medicine

## 2023-09-17 DIAGNOSIS — F900 Attention-deficit hyperactivity disorder, predominantly inattentive type: Secondary | ICD-10-CM | POA: Diagnosis not present

## 2023-09-17 DIAGNOSIS — F411 Generalized anxiety disorder: Secondary | ICD-10-CM | POA: Diagnosis not present

## 2023-09-23 ENCOUNTER — Ambulatory Visit (INDEPENDENT_AMBULATORY_CARE_PROVIDER_SITE_OTHER): Payer: 59 | Admitting: Family Medicine

## 2023-09-23 VITALS — BP 117/83 | HR 70 | Wt 149.6 lb

## 2023-09-23 DIAGNOSIS — E559 Vitamin D deficiency, unspecified: Secondary | ICD-10-CM

## 2023-09-23 DIAGNOSIS — N760 Acute vaginitis: Secondary | ICD-10-CM

## 2023-09-23 DIAGNOSIS — Z8481 Family history of carrier of genetic disease: Secondary | ICD-10-CM

## 2023-09-23 DIAGNOSIS — N979 Female infertility, unspecified: Secondary | ICD-10-CM | POA: Diagnosis not present

## 2023-09-23 DIAGNOSIS — Z8781 Personal history of (healed) traumatic fracture: Secondary | ICD-10-CM

## 2023-09-23 DIAGNOSIS — Z01419 Encounter for gynecological examination (general) (routine) without abnormal findings: Secondary | ICD-10-CM

## 2023-09-23 MED ORDER — VITAMIN D (ERGOCALCIFEROL) 1.25 MG (50000 UNIT) PO CAPS
50000.0000 [IU] | ORAL_CAPSULE | ORAL | 0 refills | Status: AC
Start: 2023-09-23 — End: 2023-11-18

## 2023-09-23 NOTE — Patient Instructions (Signed)

## 2023-09-23 NOTE — Assessment & Plan Note (Addendum)
[id number] - Has returned to [facility]. Previously has had IUI x 2 which did not work.

## 2023-09-23 NOTE — Assessment & Plan Note (Addendum)
16109 - Mom with recurrent breast cancer now, and negative for genetic cause. Told she did not need further testing. Hs dense breasts--had screening last year.

## 2023-09-23 NOTE — Progress Notes (Signed)
Subjective:     Debbie Bowen is a 43 y.o. female and is here for a comprehensive physical exam. The patient reports no problems. Functional wellness, taking some probiotics to decrease UTI's. Has gone back to [facility] for help with conception. Cycles are regular and coming q 28 days. She has Vitamin B12 and D deficiency. On daily supplement of Vitamin D. She stopped the B 12 as it was making her break out. She has h/o recurrent vaginitis and would like Ureaplasma testing.     The following portions of the patient's history were reviewed and updated as appropriate: allergies, current medications, past family history, past medical history, past social history, past surgical history, and problem list.  Review of Systems Pertinent items noted in HPI and remainder of comprehensive ROS otherwise negative.   Objective:    BP 117/83   Pulse 70   Wt 149 lb 9.6 oz (67.9 kg)   BMI 23.43 kg/m  General appearance: alert, cooperative, and appears stated age Head: Normocephalic, without obvious abnormality, atraumatic Neck: no adenopathy, supple, symmetrical, trachea midline, and thyroid not enlarged, symmetric, no tenderness/mass/nodules Lungs: clear to auscultation bilaterally Heart: regular rate and rhythm, S1, S2 normal, no murmur, click, rub or gallop Abdomen: soft, non-tender; bowel sounds normal; no masses,  no organomegaly Extremities: extremities normal, atraumatic, no cyanosis or edema Skin: Skin color, texture, turgor normal. No rashes or lesions Acne on face Neurologic: Grossly normal    Assessment:    Healthy female exam.      Plan:   Problem List Items Addressed This Visit       Unprioritized   Infertility, female (Chronic)   [phone] - Has returned to [facility]. Previously has had IUI x 2 which did not work.      Family history of breast cancer gene mutation in first degree relative   [phone] - Mom with recurrent breast cancer now, and  negative for genetic cause. Told she did not need further testing. Hs dense breasts--had screening last year.      Vitamin D deficiency   99214 - Trial of weekly supplementation, then return to daily repletion.      Relevant Medications   Vitamin D, Ergocalciferol, (DRISDOL) 1.25 MG (50000 UNIT) CAPS capsule   Other Visit Diagnoses       Encounter for gynecological examination without abnormal finding    -  Primary   [phone] - does not need pap this year. Mammogram up to date. Declines flu and STD testing.     Recurrent vaginitis       99214 - check PCR with aptima swab and treat accordingly   Relevant Orders   Genital Mycoplasmas NAA, Swab      Return in 1 year (on 09/22/2024).    See After Visit Summary for Counseling Recommendations

## 2023-09-23 NOTE — Progress Notes (Signed)
Patient presents for Annual.  LMP: 09/14/23 Last pap: Date: 2023-WNL Contraception: None- same sex relationship  Mammogram: 06/22/23- [facility]  STD Screening: Declines Flu Vaccine :  Declines   CC:  Annual  Wants to discuss Ureaplasma

## 2023-09-23 NOTE — Assessment & Plan Note (Addendum)
11914 - Trial of weekly supplementation, then return to daily repletion.

## 2023-09-25 ENCOUNTER — Ambulatory Visit (INDEPENDENT_AMBULATORY_CARE_PROVIDER_SITE_OTHER): Payer: 59 | Admitting: Primary Care

## 2023-09-25 ENCOUNTER — Encounter: Payer: Self-pay | Admitting: Primary Care

## 2023-09-25 VITALS — BP 120/78 | HR 79 | Temp 97.9°F | Ht 67.0 in | Wt 148.0 lb

## 2023-09-25 DIAGNOSIS — E538 Deficiency of other specified B group vitamins: Secondary | ICD-10-CM | POA: Insufficient documentation

## 2023-09-25 DIAGNOSIS — E785 Hyperlipidemia, unspecified: Secondary | ICD-10-CM

## 2023-09-25 LAB — LIPID PANEL
Cholesterol: 286 mg/dL — ABNORMAL HIGH (ref 0–200)
HDL: 51.7 mg/dL (ref >39.00)
LDL Cholesterol: 216 mg/dL — ABNORMAL HIGH (ref 0–99)
NonHDL: 234.17
Total CHOL/HDL Ratio: 6
Triglycerides: 91 mg/dL (ref 0.0–149.0)
VLDL: 18.2 mg/dL (ref 0.0–40.0)

## 2023-09-25 LAB — GENITAL MYCOPLASMAS NAA, SWAB
Mycoplasma genitalium NAA: NEGATIVE
Mycoplasma hominis NAA: NEGATIVE
Ureaplasma spp NAA: NEGATIVE

## 2023-09-25 LAB — VITAMIN B12: Vitamin B-12: 411 pg/mL (ref 211–911)

## 2023-09-25 MED ORDER — CYANOCOBALAMIN 1000 MCG/ML IJ SOLN
1000.0000 ug | Freq: Once | INTRAMUSCULAR | Status: AC
  Administered 2023-09-25: 1000 ug via INTRAMUSCULAR

## 2023-09-25 NOTE — Assessment & Plan Note (Signed)
Repeat lipid panel pending.  Consider CT cardiac calcium scoring given elevated LDL coupled with high lipoprotein a.

## 2023-09-25 NOTE — Assessment & Plan Note (Signed)
Repeat B12 level pending today.  B12 1000 mcg intramuscularly provided today. Frequency of injections will be determined after labs have resulted.

## 2023-09-25 NOTE — Progress Notes (Signed)
Subjective:    Patient ID: Debbie Bowen, female    DOB: [birth year], 43 y.o.   MRN: [id number]  HPI  Debbie Bowen is a very pleasant 43 y.o. female with a history of recurrent UTI, hyperlipidemia, chronic back pain, fatigue, GAD, vitamin D deficiency who presents today to discuss low vitamin B12 and for follow-up of hyperlipidemia.  History of low normal vitamin B12 levels with last reading of 246 in September 2024.  She recently visited with her gynecologist who recommended B12 injections as she is planning on starting IVF soon.  Today she would like to proceed with B12 injections.  Previously managed on oral B12 supplements but she discontinued use in October 2024 due to facial acne. She has undergone B12 injections previously, doesn't recall if she had a reaction or not.  She is also due for repeat lipid panel.  She denies a family history of heart attack/stroke.  Last LDL was 167 in September 2024.  Lipoprotein a of 193 in October 2023.  Since her last cholesterol check she has been working on her diet.   Review of Systems  Respiratory:  Negative for shortness of breath.   Cardiovascular:  Negative for chest pain.  Neurological:  Negative for numbness.         Past Medical History:  Diagnosis Date   Acute knee pain 07/18/2022   Acute wrist pain, left 01/06/2023   ADD (attention deficit disorder)    No medications   BV (bacterial vaginosis)    recurrent   Communication problem 03/20/2020   Eczema    Facial pain 09/22/2019   GERD (gastroesophageal reflux disease)    Insomnia    Orthodontics    invisalign   Pain in thumb joint with movement of right hand 07/11/2021   Scoliosis    no issues   Urticaria    UTI (lower urinary tract infection)    recurrent   Wears contact lenses     Social History   Socioeconomic History   Marital status: Married    Spouse name: Not on file   Number of children: Not on file   Years of education: 16    Highest education level: Bachelor's degree (e.g., BA, AB, BS)  Occupational History   Occupation: Nature conservation officer    Comment: [employer]  Tobacco Use   Smoking status: Never   Smokeless tobacco: Never  Vaping Use   Vaping status: Never Used  Substance and Sexual Activity   Alcohol use: Yes    Alcohol/week: 2.0 standard drinks of alcohol    Types: 2 Glasses of wine per week    Comment: occasionally   Drug use: No   Sexual activity: Yes    Partners: Male    Birth control/protection: None  Other Topics Concern   Not on file  Social History Narrative   Single.    No children.   Self Employed.   Enjoys projects around her house.    Social Drivers of Corporate investment banker Strain: Low Risk  (09/23/2023)   Overall Financial Resource Strain (CARDIA)    Difficulty of Paying Living Expenses: Not hard at all  Food Insecurity: No Food Insecurity (09/23/2023)   Hunger Vital Sign    Worried About Running Out of Food in the Last Year: Never true    Ran Out of Food in the Last Year: Never true  Transportation Needs: No Transportation Needs (09/23/2023)   PRAPARE - Administrator, Civil Service (Medical):  No    Lack of Transportation (Non-Medical): No  Physical Activity: Sufficiently Active (09/23/2023)   Exercise Vital Sign    Days of Exercise per Week: 3 days    Minutes of Exercise per Session: 60 min  Stress: No Stress Concern Present (09/23/2023)   [employer] of Occupational Health - Occupational Stress Questionnaire    Feeling of Stress : Only a little  Social Connections: Moderately Integrated (09/23/2023)   Social Connection and Isolation Panel [NHANES]    Frequency of Communication with Friends and Family: More than three times a week    Frequency of Social Gatherings with Friends and Family: Once a week    Attends Religious Services: 1 to 4 times per year    Active Member of [employer] or Organizations: No    Attends Engineer, structural:  Not on file    Marital Status: Married  Catering manager Violence: Not on file    Past Surgical History:  Procedure Laterality Date   CERVIX LESION DESTRUCTION     DILATION AND CURETTAGE OF UTERUS     for TAB   ESOPHAGOGASTRODUODENOSCOPY (EGD) WITH PROPOFOL N/A 10/04/2015   Procedure: ESOPHAGOGASTRODUODENOSCOPY (EGD) WITH PROPOFOL;  Surgeon: Midge Minium, MD;  Location: [facility];  Service: Endoscopy;  Laterality: N/A;    Family History  Problem Relation Age of Onset   Diabetes Mother    Breast cancer Mother 8       Breast one side BRCA negative. Triple negative   Heart failure Mother    Juvenile idiopathic arthritis Father    Eczema Father    Asthma Sister    Asthma Brother    Diabetes Maternal Grandmother    Prostate cancer Maternal Grandfather    Breast cancer Paternal Grandmother    Diabetes Paternal Grandmother    Prostate cancer Paternal Grandfather    Diabetes Maternal Aunt    Diabetes Paternal Aunt    Breast cancer Paternal Aunt 43   Breast cancer Paternal Aunt 57   Urticaria Neg Hx    Allergic rhinitis Neg Hx    Angioedema Neg Hx    Immunodeficiency Neg Hx     No Known Allergies  Current Outpatient Medications on File Prior to Visit  Medication Sig Dispense Refill   nitrofurantoin (MACRODANTIN) 50 MG capsule Take 1 capsule (50 mg total) by mouth as needed. For UTI prevention. 30 capsule 0   Vitamin D, Ergocalciferol, (DRISDOL) 1.25 MG (50000 UNIT) CAPS capsule Take 1 capsule (50,000 Units total) by mouth every 7 (seven) days. 8 capsule 0   albuterol (VENTOLIN HFA) 108 (90 Base) MCG/ACT inhaler Inhale 1-2 puffs into the lungs every 6 (six) hours as needed for shortness of breath. (Patient not taking: Reported on 09/25/2023) 1 each 0   ibuprofen (ADVIL) 200 MG tablet Take 200 mg by mouth as needed. (Patient not taking: Reported on 09/25/2023)     Prenatal Multivit-Min-Fe-FA (PRENATAL, W/IRON & FA,) 27-0.8 MG TABS Take 1 tablet by mouth daily. (Patient not  taking: Reported on 09/25/2023)     No current facility-administered medications on file prior to visit.    BP 120/78   Pulse 79   Temp 97.9 F (36.6 C) (Temporal)   Ht 5\' 7"  (1.702 m)   Wt 148 lb (67.1 kg)   SpO2 99%   BMI 23.18 kg/m  Objective:   Physical Exam Cardiovascular:     Rate and Rhythm: Normal rate and regular rhythm.  Pulmonary:     Effort: Pulmonary effort is  normal.     Breath sounds: Normal breath sounds.  Musculoskeletal:     Cervical back: Neck supple.  Skin:    General: Skin is warm and dry.  Neurological:     Mental Status: She is alert and oriented to person, place, and time.  Psychiatric:        Mood and Affect: Mood normal.           Assessment & Plan:  Vitamin B12 deficiency Assessment & Plan: Repeat B12 level pending today.  B12 1000 mcg intramuscularly provided today. Frequency of injections will be determined after labs have resulted.  Orders: -     Vitamin B12  Hyperlipidemia, unspecified hyperlipidemia type Assessment & Plan: Repeat lipid panel pending.  Consider CT cardiac calcium scoring given elevated LDL coupled with high lipoprotein a.  Orders: -     Lipid panel        Doreene Nest, NP

## 2023-09-25 NOTE — Patient Instructions (Signed)
Stop by the lab prior to leaving today. I will notify you of your results once received.   It was a pleasure to see you today!

## 2023-09-25 NOTE — Addendum Note (Signed)
Addended by: Lonia Blood on: 09/25/2023 08:23 AM   Modules accepted: Orders

## 2023-10-14 ENCOUNTER — Ambulatory Visit: Payer: 59 | Admitting: Family Medicine

## 2023-10-22 DIAGNOSIS — F411 Generalized anxiety disorder: Secondary | ICD-10-CM | POA: Diagnosis not present

## 2023-10-22 DIAGNOSIS — F900 Attention-deficit hyperactivity disorder, predominantly inattentive type: Secondary | ICD-10-CM | POA: Diagnosis not present

## 2023-10-26 DIAGNOSIS — F900 Attention-deficit hyperactivity disorder, predominantly inattentive type: Secondary | ICD-10-CM | POA: Diagnosis not present

## 2023-10-26 DIAGNOSIS — F411 Generalized anxiety disorder: Secondary | ICD-10-CM | POA: Diagnosis not present

## 2023-10-29 DIAGNOSIS — F900 Attention-deficit hyperactivity disorder, predominantly inattentive type: Secondary | ICD-10-CM | POA: Diagnosis not present

## 2023-10-29 DIAGNOSIS — F411 Generalized anxiety disorder: Secondary | ICD-10-CM | POA: Diagnosis not present

## 2023-11-02 DIAGNOSIS — E288 Other ovarian dysfunction: Secondary | ICD-10-CM | POA: Diagnosis not present

## 2023-11-02 DIAGNOSIS — Z319 Encounter for procreative management, unspecified: Secondary | ICD-10-CM | POA: Diagnosis not present

## 2023-11-05 DIAGNOSIS — F900 Attention-deficit hyperactivity disorder, predominantly inattentive type: Secondary | ICD-10-CM | POA: Diagnosis not present

## 2023-11-05 DIAGNOSIS — F411 Generalized anxiety disorder: Secondary | ICD-10-CM | POA: Diagnosis not present

## 2023-11-11 DIAGNOSIS — F411 Generalized anxiety disorder: Secondary | ICD-10-CM | POA: Diagnosis not present

## 2023-11-11 DIAGNOSIS — F900 Attention-deficit hyperactivity disorder, predominantly inattentive type: Secondary | ICD-10-CM | POA: Diagnosis not present

## 2023-12-03 DIAGNOSIS — F411 Generalized anxiety disorder: Secondary | ICD-10-CM | POA: Diagnosis not present

## 2023-12-03 DIAGNOSIS — F900 Attention-deficit hyperactivity disorder, predominantly inattentive type: Secondary | ICD-10-CM | POA: Diagnosis not present

## 2023-12-16 DIAGNOSIS — Z319 Encounter for procreative management, unspecified: Secondary | ICD-10-CM | POA: Diagnosis not present

## 2023-12-16 DIAGNOSIS — E559 Vitamin D deficiency, unspecified: Secondary | ICD-10-CM | POA: Diagnosis not present

## 2023-12-17 DIAGNOSIS — F900 Attention-deficit hyperactivity disorder, predominantly inattentive type: Secondary | ICD-10-CM | POA: Diagnosis not present

## 2023-12-17 DIAGNOSIS — F411 Generalized anxiety disorder: Secondary | ICD-10-CM | POA: Diagnosis not present

## 2023-12-23 DIAGNOSIS — F411 Generalized anxiety disorder: Secondary | ICD-10-CM | POA: Diagnosis not present

## 2023-12-23 DIAGNOSIS — F900 Attention-deficit hyperactivity disorder, predominantly inattentive type: Secondary | ICD-10-CM | POA: Diagnosis not present

## 2023-12-28 ENCOUNTER — Encounter (HOSPITAL_BASED_OUTPATIENT_CLINIC_OR_DEPARTMENT_OTHER): Payer: Self-pay | Admitting: Internal Medicine

## 2023-12-28 ENCOUNTER — Ambulatory Visit (HOSPITAL_BASED_OUTPATIENT_CLINIC_OR_DEPARTMENT_OTHER): Admitting: Internal Medicine

## 2023-12-28 ENCOUNTER — Encounter (HOSPITAL_BASED_OUTPATIENT_CLINIC_OR_DEPARTMENT_OTHER): Payer: Self-pay

## 2023-12-28 ENCOUNTER — Telehealth (HOSPITAL_BASED_OUTPATIENT_CLINIC_OR_DEPARTMENT_OTHER): Payer: Self-pay | Admitting: Internal Medicine

## 2023-12-28 VITALS — BP 118/74 | HR 75 | Ht 66.0 in | Wt 152.0 lb

## 2023-12-28 DIAGNOSIS — E7800 Pure hypercholesterolemia, unspecified: Secondary | ICD-10-CM

## 2023-12-28 NOTE — Telephone Encounter (Signed)
 Genetic test for dyslipidemia/ascvd panel ordered ([facility]) Cheek swab completed in office Specimen and necessary paperwork mailed. ID: [id number]

## 2023-12-28 NOTE — Patient Instructions (Signed)
 Medication Instructions:  NO CHANGES  *If you need a refill on your cardiac medications before your next appointment, please call your pharmacy*  Lab Work: FASTING lab work in 6 months -- complete about ONE WEEK before next visit  If you have labs (blood work) drawn today and your tests are completely normal, you will receive your results only by: MyChart Message (if you have MyChart) OR A paper copy in the mail If you have any lab test that is abnormal or we need to change your treatment, we will call you to review the results.  Testing/Procedures: Genetic Test -- results available in 2-3 weeks Dr. Maximo Spar will reach out via MyChart  Follow-Up: At [facility], you and your health needs are our priority.  As part of our continuing mission to provide you with exceptional heart care, our providers are all part of one team.  This team includes your primary Cardiologist (physician) and Advanced Practice Providers or APPs (Physician Assistants and Nurse Practitioners) who all work together to provide you with the care you need, when you need it.  Your next appointment:    6 months with Dr. Maximo Spar or Slater Duncan NP (lipid clinic)  We recommend signing up for the patient portal called "MyChart".  Sign up information is provided on this After Visit Summary.  MyChart is used to connect with patients for Virtual Visits (Telemedicine).  Patients are able to view lab/test results, encounter notes, upcoming appointments, etc.  Non-urgent messages can be sent to your provider as well.   To learn more about what you can do with MyChart, go to [email or web address].

## 2023-12-28 NOTE — Progress Notes (Signed)
 LIPID CLINIC CONSULT NOTE  Chief Complaint:  Manage dyslipidemia  Primary Care Physician: Gabriel John, NP  Primary Cardiologist:  Constancia Delton, MD  HPI:  Debbie Bowen is a 43 y.o. female who is being seen today for the evaluation of dyslipidemia at the request of Gabriel John, NP.  This is a pleasant 43 year old female kindly referred for evaluation management of dyslipidemia.  She has a history of elevated cholesterol with LDL generally between 130 and 160 however recent labs about 3 months ago showed total cholesterol 286, triglycerides 91, HDL 51 and LDL 216.  She has not been on any lipid-lowering therapies.  She does not have any hypertension or diabetes.  She does have high cholesterol multiple females on her mother side but really no early onset heart disease.  She has been working on diet and lifestyle modifications.  She says she has gained some weight which might explain some increase in cholesterol.  However I am concerned about a genetic cholesterol disorder.  PMHx:  Past Medical History:  Diagnosis Date   Acute knee pain 07/18/2022   Acute wrist pain, left 01/06/2023   ADD (attention deficit disorder)    No medications   BV (bacterial vaginosis)    recurrent   Communication problem 03/20/2020   Eczema    Facial pain 09/22/2019   GERD (gastroesophageal reflux disease)    Insomnia    Orthodontics    invisalign   Pain in thumb joint with movement of right hand 07/11/2021   Scoliosis    no issues   Urticaria    UTI (lower urinary tract infection)    recurrent   Wears contact lenses     Past Surgical History:  Procedure Laterality Date   CERVIX LESION DESTRUCTION     DILATION AND CURETTAGE OF UTERUS     for TAB   ESOPHAGOGASTRODUODENOSCOPY (EGD) WITH PROPOFOL  N/A 10/04/2015   Procedure: ESOPHAGOGASTRODUODENOSCOPY (EGD) WITH PROPOFOL ;  Surgeon: Marnee Sink, MD;  Location: [facility];  Service: Endoscopy;  Laterality: N/A;     FAMHx:  Family History  Problem Relation Age of Onset   Diabetes Mother    Breast cancer Mother 83       Breast one side BRCA negative. Triple negative   Heart failure Mother    Juvenile idiopathic arthritis Father    Eczema Father    Asthma Sister    Asthma Brother    Diabetes Maternal Grandmother    Prostate cancer Maternal Grandfather    Breast cancer Paternal Grandmother    Diabetes Paternal Grandmother    Prostate cancer Paternal Grandfather    Diabetes Maternal Aunt    Diabetes Paternal Aunt    Breast cancer Paternal Aunt 71   Breast cancer Paternal Aunt 31   Urticaria Neg Hx    Allergic rhinitis Neg Hx    Angioedema Neg Hx    Immunodeficiency Neg Hx     SOCHx:   reports that she has never smoked. She has never used smokeless tobacco. She reports current alcohol use of about 2.0 standard drinks of alcohol per week. She reports that she does not use drugs.  ALLERGIES:  No Known Allergies  ROS: Pertinent items noted in HPI and remainder of comprehensive ROS otherwise negative.  HOME MEDS: Current Outpatient Medications on File Prior to Visit  Medication Sig Dispense Refill   cholecalciferol (VITAMIN D3) 25 MCG (1000 UNIT) tablet Take 1,000 Units by mouth daily.     ibuprofen (ADVIL) 200  MG tablet Take 200 mg by mouth as needed.     nitrofurantoin  (MACRODANTIN ) 50 MG capsule Take 1 capsule (50 mg total) by mouth as needed. For UTI prevention. 30 capsule 0   Prenatal Multivit-Min-Fe-FA (PRENATAL, W/IRON & FA,) 27-0.8 MG TABS Take 1 tablet by mouth daily.     tretinoin (RETIN-A) 0.05 % cream Apply topically at bedtime.     albuterol  (VENTOLIN  HFA) 108 (90 Base) MCG/ACT inhaler Inhale 1-2 puffs into the lungs every 6 (six) hours as needed for shortness of breath. (Patient not taking: Reported on 12/28/2023) 1 each 0   No current facility-administered medications on file prior to visit.    LABS/IMAGING: No results found for this or any previous visit (from the  past 48 hours). No results found.  LIPID PANEL:    Component Value Date/Time   CHOL 286 (H) 09/25/2023 0819   CHOL 208 (H) 12/21/2015 1341   TRIG 91.0 09/25/2023 0819   HDL 51.70 09/25/2023 0819   HDL 58 12/21/2015 1341   CHOLHDL 6 09/25/2023 0819   VLDL 18.2 09/25/2023 0819   LDLCALC 216 (H) 09/25/2023 0819   LDLCALC 131 (H) 12/21/2015 1341    WEIGHTS: Wt Readings from Last 3 Encounters:  12/28/23 152 lb (68.9 kg)  09/25/23 148 lb (67.1 kg)  09/23/23 149 lb 9.6 oz (67.9 kg)    VITALS: BP 118/74 (BP Location: Left Arm, Patient Position: Sitting, Cuff Size: Normal)   Pulse 75   Ht 5\' 6"  (1.676 m)   Wt 152 lb (68.9 kg)   SpO2 98%   BMI 24.53 kg/m   EXAM: Deferred  EKG: Deferred  ASSESSMENT: Possible familial hyperlipidemia, LDL greater than 190  PLAN: 1.   Ms. Monier has a possible familial hyperlipidemia with LDL greater than 190.  There could be some dietary lifestyle reasons for this.  She wants to continue to work on that.  She does have multiple family members with high cholesterol and therefore think it is reasonable to consider genetic testing.  If she has a pathogenic variant, and strongly suggest lipid-lowering therapy.  Otherwise she will continue to work on diet and lifestyle modifications with a plan to repeat lipids including NMR and LP(a) in about 6 months and follow-up at that time with our APP.  Thanks again for the kind referral.  Hazle Lites, MD, [facility], FNLA, FACP  [place]  [facility]  Medical Director of the [facility]id [facility] &  Cardiovascular Risk Reduction Clinic Diplomate of the American Board of Clinical Lipidology Attending Cardiologist  Direct Dial: [phone]  Fax: [phone]  Website:  [email or web address]  Hazle Lites 12/28/2023, 2:36 PM

## 2024-01-14 DIAGNOSIS — F900 Attention-deficit hyperactivity disorder, predominantly inattentive type: Secondary | ICD-10-CM | POA: Diagnosis not present

## 2024-01-14 DIAGNOSIS — F411 Generalized anxiety disorder: Secondary | ICD-10-CM | POA: Diagnosis not present

## 2024-01-19 ENCOUNTER — Encounter: Payer: Self-pay | Admitting: Internal Medicine

## 2024-01-19 DIAGNOSIS — F900 Attention-deficit hyperactivity disorder, predominantly inattentive type: Secondary | ICD-10-CM | POA: Diagnosis not present

## 2024-01-19 DIAGNOSIS — F411 Generalized anxiety disorder: Secondary | ICD-10-CM | POA: Diagnosis not present

## 2024-01-27 DIAGNOSIS — E288 Other ovarian dysfunction: Secondary | ICD-10-CM | POA: Diagnosis not present

## 2024-01-28 DIAGNOSIS — F411 Generalized anxiety disorder: Secondary | ICD-10-CM | POA: Diagnosis not present

## 2024-01-28 DIAGNOSIS — F900 Attention-deficit hyperactivity disorder, predominantly inattentive type: Secondary | ICD-10-CM | POA: Diagnosis not present

## 2024-01-29 ENCOUNTER — Encounter (HOSPITAL_BASED_OUTPATIENT_CLINIC_OR_DEPARTMENT_OTHER): Payer: Self-pay | Admitting: Internal Medicine

## 2024-01-29 NOTE — Telephone Encounter (Signed)
Routing to correct triage pool

## 2024-01-29 NOTE — Telephone Encounter (Signed)
 Spoke with patient and she would like to discuss genetic testing results.  Informed patient provider is out of office today. He will respond once he is back in office

## 2024-01-29 NOTE — Telephone Encounter (Signed)
Pt calling to go over results. Please advise

## 2024-02-02 ENCOUNTER — Ambulatory Visit: Admitting: Primary Care

## 2024-02-03 DIAGNOSIS — F411 Generalized anxiety disorder: Secondary | ICD-10-CM | POA: Diagnosis not present

## 2024-02-03 DIAGNOSIS — F900 Attention-deficit hyperactivity disorder, predominantly inattentive type: Secondary | ICD-10-CM | POA: Diagnosis not present

## 2024-02-07 ENCOUNTER — Encounter (HOSPITAL_BASED_OUTPATIENT_CLINIC_OR_DEPARTMENT_OTHER): Payer: Self-pay | Admitting: Internal Medicine

## 2024-02-08 DIAGNOSIS — F411 Generalized anxiety disorder: Secondary | ICD-10-CM | POA: Diagnosis not present

## 2024-02-08 DIAGNOSIS — F900 Attention-deficit hyperactivity disorder, predominantly inattentive type: Secondary | ICD-10-CM | POA: Diagnosis not present

## 2024-02-09 ENCOUNTER — Ambulatory Visit: Admitting: Primary Care

## 2024-02-11 DIAGNOSIS — F411 Generalized anxiety disorder: Secondary | ICD-10-CM | POA: Diagnosis not present

## 2024-02-11 DIAGNOSIS — F900 Attention-deficit hyperactivity disorder, predominantly inattentive type: Secondary | ICD-10-CM | POA: Diagnosis not present

## 2024-02-23 ENCOUNTER — Ambulatory Visit (INDEPENDENT_AMBULATORY_CARE_PROVIDER_SITE_OTHER): Admitting: Primary Care

## 2024-02-23 ENCOUNTER — Other Ambulatory Visit

## 2024-02-23 VITALS — BP 136/82 | HR 76 | Temp 98.1°F | Ht 66.0 in | Wt 147.0 lb

## 2024-02-23 DIAGNOSIS — F902 Attention-deficit hyperactivity disorder, combined type: Secondary | ICD-10-CM

## 2024-02-23 DIAGNOSIS — F900 Attention-deficit hyperactivity disorder, predominantly inattentive type: Secondary | ICD-10-CM | POA: Diagnosis not present

## 2024-02-23 DIAGNOSIS — R112 Nausea with vomiting, unspecified: Secondary | ICD-10-CM

## 2024-02-23 DIAGNOSIS — N390 Urinary tract infection, site not specified: Secondary | ICD-10-CM

## 2024-02-23 DIAGNOSIS — F411 Generalized anxiety disorder: Secondary | ICD-10-CM | POA: Diagnosis not present

## 2024-02-23 MED ORDER — ATOMOXETINE HCL 25 MG PO CAPS: 25.0000 mg | ORAL_CAPSULE | Freq: Every day | ORAL | 0 refills | Status: AC

## 2024-02-23 MED ORDER — NITROFURANTOIN MACROCRYSTAL 50 MG PO CAPS: 50.0000 mg | ORAL_CAPSULE | ORAL | 0 refills | Status: DC | PRN

## 2024-02-23 MED ORDER — FAMOTIDINE 20 MG PO TABS: 20.0000 mg | ORAL_TABLET | Freq: Two times a day (BID) | ORAL | 0 refills | Status: AC

## 2024-02-23 NOTE — Patient Instructions (Signed)
 You will either be contacted via phone regarding your referral to GI, or you may receive a letter on your MyChart portal from our referral team with instructions for scheduling an appointment. Please let us  know if you have not been contacted by anyone within two weeks.  Start Strattera  25 mg once daily for ADHD.  Schedule a lab appointment to return for labs at your convenience.  Start famotidine 20 mg once to twice daily for nausea.  It was a pleasure to see you today!

## 2024-02-23 NOTE — Progress Notes (Signed)
 Subjective:    Patient ID: Debbie Bowen, female    DOB: 1980/09/10, 43 y.o.   MRN: [id number]  HPI  Debbie Bowen is a very pleasant 43 y.o. female with a history of GERD, recurrent UTI, dysmenorrhea, hyperlipidemia, chronic back pain, palpitations, GAD, fatigue, ADHD, postural dizziness who presents today to discuss several concerns.  She is also needing a refill of her Macrobid  prescription.  1) ADHD: Formally diagnosed in summer 2017.  Currently not managed on treatment.  Prior treatment included Adderall XR 15 mg and Adderall IR 15 mg daily.  Both medications caused intolerable side effects. She was later initiated on Strattera  40 mg which caused decreased appetite and weight loss.   She would like to resume treatment due to difficulty focusing, difficulty remembering to do things.   2) Nausea: Chronic history, intermittent. Over the last several months she's noticed mild consistent daily nausea. She feels unsettled in the mid abdomen. She does have intermittent vomiting, always with eating, can occur with any type of food, chronic for the last 3-6 months.   She denies new supplements, abdominal pain, trouble swallowing, choking, changes in her diet, esophageal burning, belching. She tested negative for pregnancy 2 months ago. She is frequently testing for pregnancy as she and her husband are trying to conceive.   Wt Readings from Last 3 Encounters:  02/23/24 147 lb (66.7 kg)  12/28/23 152 lb (68.9 kg)  09/25/23 148 lb (67.1 kg)      Review of Systems  Constitutional:  Negative for fever.  Gastrointestinal:  Positive for nausea and vomiting. Negative for abdominal pain, constipation and diarrhea.  Psychiatric/Behavioral:  Positive for decreased concentration.          Past Medical History:  Diagnosis Date   Acute knee pain 07/18/2022   Acute wrist pain, left 01/06/2023   ADD (attention deficit disorder)    No medications   BV (bacterial  vaginosis)    recurrent   Communication problem 03/20/2020   Eczema    Facial pain 09/22/2019   GERD (gastroesophageal reflux disease)    Insomnia    Orthodontics    invisalign   Pain in thumb joint with movement of right hand 07/11/2021   Scoliosis    no issues   Urticaria    UTI (lower urinary tract infection)    recurrent   Wears contact lenses     Social History   Socioeconomic History   Marital status: Married    Spouse name: Not on file   Number of children: Not on file   Years of education: 16   Highest education level: Bachelor's degree (e.g., BA, AB, BS)  Occupational History   Occupation: Nature conservation officer    Comment: [employer]  Tobacco Use   Smoking status: Never   Smokeless tobacco: Never  Vaping Use   Vaping status: Never Used  Substance and Sexual Activity   Alcohol use: Yes    Alcohol/week: 2.0 standard drinks of alcohol    Types: 2 Glasses of wine per week    Comment: occasionally   Drug use: No   Sexual activity: Yes    Partners: Male    Birth control/protection: None  Other Topics Concern   Not on file  Social History Narrative   Single.    No children.   Self Employed.   Enjoys projects around her house.    Social Drivers of Corporate investment banker Strain: Low Risk  (02/23/2024)   Overall Physicist, medical  Strain (CARDIA)    Difficulty of Paying Living Expenses: Not hard at all  Food Insecurity: No Food Insecurity (02/23/2024)   Hunger Vital Sign    Worried About Running Out of Food in the Last Year: Never true    Ran Out of Food in the Last Year: Never true  Transportation Needs: No Transportation Needs (02/23/2024)   PRAPARE - Administrator, Civil Service (Medical): No    Lack of Transportation (Non-Medical): No  Physical Activity: Sufficiently Active (02/23/2024)   Exercise Vital Sign    Days of Exercise per Week: 4 days    Minutes of Exercise per Session: 60 min  Stress: No Stress Concern Present  (02/23/2024)   [employer] of Occupational Health - Occupational Stress Questionnaire    Feeling of Stress: Only a little  Social Connections: Moderately Integrated (02/23/2024)   Social Connection and Isolation Panel    Frequency of Communication with Friends and Family: More than three times a week    Frequency of Social Gatherings with Friends and Family: Once a week    Attends Religious Services: 1 to 4 times per year    Active Member of [employer] or Organizations: No    Attends Engineer, structural: Not on file    Marital Status: Married  Catering manager Violence: Not on file    Past Surgical History:  Procedure Laterality Date   CERVIX LESION DESTRUCTION     DILATION AND CURETTAGE OF UTERUS     for TAB   ESOPHAGOGASTRODUODENOSCOPY (EGD) WITH PROPOFOL  N/A 10/04/2015   Procedure: ESOPHAGOGASTRODUODENOSCOPY (EGD) WITH PROPOFOL ;  Surgeon: Rogelia Copping, MD;  Location: [facility];  Service: Endoscopy;  Laterality: N/A;    Family History  Problem Relation Age of Onset   Diabetes Mother    Breast cancer Mother 29       Breast one side BRCA negative. Triple negative   Heart failure Mother    Juvenile idiopathic arthritis Father    Eczema Father    Asthma Sister    Asthma Brother    Diabetes Maternal Grandmother    Prostate cancer Maternal Grandfather    Breast cancer Paternal Grandmother    Diabetes Paternal Grandmother    Prostate cancer Paternal Grandfather    Diabetes Maternal Aunt    Diabetes Paternal Aunt    Breast cancer Paternal Aunt 68   Breast cancer Paternal Aunt 10   Diabetes Paternal Aunt    Diabetes Paternal Aunt    Urticaria Neg Hx    Allergic rhinitis Neg Hx    Angioedema Neg Hx    Immunodeficiency Neg Hx     No Known Allergies  Current Outpatient Medications on File Prior to Visit  Medication Sig Dispense Refill   cholecalciferol (VITAMIN D3) 25 MCG (1000 UNIT) tablet Take 1,000 Units by mouth daily.     ibuprofen (ADVIL) 200 MG  tablet Take 200 mg by mouth as needed.     Prenatal Multivit-Min-Fe-FA (PRENATAL, W/IRON & FA,) 27-0.8 MG TABS Take 1 tablet by mouth daily.     tretinoin (RETIN-A) 0.05 % cream Apply topically at bedtime.     albuterol  (VENTOLIN  HFA) 108 (90 Base) MCG/ACT inhaler Inhale 1-2 puffs into the lungs every 6 (six) hours as needed for shortness of breath. (Patient not taking: Reported on 02/23/2024) 1 each 0   No current facility-administered medications on file prior to visit.    BP 136/82   Pulse 76   Temp 98.1 F (36.7 C) (Temporal)  Ht 5' 6 (1.676 m)   Wt 147 lb (66.7 kg)   LMP 02/02/2024   SpO2 98%   BMI 23.73 kg/m  Objective:   Physical Exam  Cardiovascular:     Rate and Rhythm: Normal rate and regular rhythm.  Pulmonary:     Effort: Pulmonary effort is normal.     Breath sounds: Normal breath sounds.  Abdominal:     Palpations: Abdomen is soft.     Tenderness: There is no abdominal tenderness.   Musculoskeletal:     Cervical back: Neck supple.   Skin:    General: Skin is warm and dry.   Neurological:     Mental Status: She is alert and oriented to person, place, and time.   Psychiatric:        Mood and Affect: Mood normal.           Assessment & Plan:  Attention deficit hyperactivity disorder (ADHD), combined type Assessment & Plan: Discussed options for treatment.  Given that side effects on stimulant therapy, will try lower dose of nonstimulant therapy.  Start Strattera  25 mg daily.  She will update.  Orders: -     Atomoxetine  HCl; Take 1 capsule (25 mg total) by mouth daily. For ADHD  Dispense: 30 capsule; Refill: 0  Recurrent UTI -     Nitrofurantoin  Macrocrystal; Take 1 capsule (50 mg total) by mouth as needed. For UTI prevention.  Dispense: 30 capsule; Refill: 0  Nausea and vomiting, unspecified vomiting type Assessment & Plan: Unclear etiology.  Checking labs today including CMP, CBC. Start famotidine 20 mg once to twice daily.  Referral  placed to GI for further evaluation.  She assures me that she is not pregnant.  Orders: -     Ambulatory referral to Gastroenterology -     Famotidine; Take 1 tablet (20 mg total) by mouth 2 (two) times daily. for nausea.  Dispense: 180 tablet; Refill: 0 -     CBC with Differential/Platelet; Future -     Comprehensive metabolic panel with GFR; Future        Kayci [place]ville K Meghen Akopyan, NP

## 2024-02-23 NOTE — Assessment & Plan Note (Signed)
 Discussed options for treatment.  Given that side effects on stimulant therapy, will try lower dose of nonstimulant therapy.  Start Strattera  25 mg daily.  She will update.

## 2024-02-23 NOTE — Assessment & Plan Note (Signed)
 Unclear etiology.  Checking labs today including CMP, CBC. Start famotidine 20 mg once to twice daily.  Referral placed to GI for further evaluation.  She assures me that she is not pregnant.

## 2024-02-24 ENCOUNTER — Other Ambulatory Visit

## 2024-02-24 ENCOUNTER — Other Ambulatory Visit (INDEPENDENT_AMBULATORY_CARE_PROVIDER_SITE_OTHER)

## 2024-02-24 DIAGNOSIS — R112 Nausea with vomiting, unspecified: Secondary | ICD-10-CM | POA: Diagnosis not present

## 2024-02-24 DIAGNOSIS — E785 Hyperlipidemia, unspecified: Secondary | ICD-10-CM

## 2024-02-25 ENCOUNTER — Ambulatory Visit: Payer: Self-pay | Admitting: Primary Care

## 2024-02-25 DIAGNOSIS — F900 Attention-deficit hyperactivity disorder, predominantly inattentive type: Secondary | ICD-10-CM | POA: Diagnosis not present

## 2024-02-25 DIAGNOSIS — F411 Generalized anxiety disorder: Secondary | ICD-10-CM | POA: Diagnosis not present

## 2024-02-25 LAB — COMPREHENSIVE METABOLIC PANEL WITH GFR
ALT: 11 U/L (ref 0–35)
AST: 16 U/L (ref 0–37)
Albumin: 4.1 g/dL (ref 3.5–5.2)
Alkaline Phosphatase: 41 U/L (ref 39–117)
BUN: 19 mg/dL (ref 6–23)
CO2: 26 meq/L (ref 19–32)
Calcium: 9.2 mg/dL (ref 8.4–10.5)
Chloride: 102 meq/L (ref 96–112)
Creatinine, Ser: 0.8 mg/dL (ref 0.40–1.20)
GFR: 90.12 mL/min (ref >60.00)
Glucose, Bld: 89 mg/dL (ref 70–99)
Potassium: 4.2 meq/L (ref 3.5–5.1)
Sodium: 136 meq/L (ref 135–145)
Total Bilirubin: 0.3 mg/dL (ref 0.2–1.2)
Total Protein: 7.8 g/dL (ref 6.0–8.3)

## 2024-02-25 LAB — CBC WITH DIFFERENTIAL/PLATELET
Basophils Absolute: 0 10*3/uL (ref 0.0–0.1)
Basophils Relative: 0.8 % (ref 0.0–3.0)
Eosinophils Absolute: 0.1 10*3/uL (ref 0.0–0.7)
Eosinophils Relative: 1.8 % (ref 0.0–5.0)
HCT: 39.9 % (ref 36.0–46.0)
Hemoglobin: 13.3 g/dL (ref 12.0–15.0)
Lymphocytes Relative: 29.8 % (ref 12.0–46.0)
Lymphs Abs: 1.7 10*3/uL (ref 0.7–4.0)
MCHC: 33.3 g/dL (ref 30.0–36.0)
MCV: 82.5 fl (ref 78.0–100.0)
Monocytes Absolute: 0.5 10*3/uL (ref 0.1–1.0)
Monocytes Relative: 8.7 % (ref 3.0–12.0)
Neutro Abs: 3.3 10*3/uL (ref 1.4–7.7)
Neutrophils Relative %: 58.9 % (ref 43.0–77.0)
Platelets: 303 10*3/uL (ref 150.0–400.0)
RBC: 4.84 Mil/uL (ref 3.87–5.11)
RDW: 14.4 % (ref 11.5–15.5)
WBC: 5.6 10*3/uL (ref 4.0–10.5)

## 2024-02-26 LAB — APOLIPOPROTEIN B: Apolipoprotein B: 129 mg/dL — ABNORMAL HIGH (ref <90)

## 2024-02-29 ENCOUNTER — Institutional Professional Consult (permissible substitution) (HOSPITAL_BASED_OUTPATIENT_CLINIC_OR_DEPARTMENT_OTHER): Admitting: Internal Medicine

## 2024-03-01 DIAGNOSIS — F900 Attention-deficit hyperactivity disorder, predominantly inattentive type: Secondary | ICD-10-CM | POA: Diagnosis not present

## 2024-03-01 DIAGNOSIS — F411 Generalized anxiety disorder: Secondary | ICD-10-CM | POA: Diagnosis not present

## 2024-03-03 ENCOUNTER — Emergency Department
Admission: EM | Admit: 2024-03-03 | Discharge: 2024-03-03 | Disposition: A | Discharge status: Home / Self Care | Attending: Emergency Medicine | Admitting: Emergency Medicine

## 2024-03-03 ENCOUNTER — Encounter: Payer: Self-pay | Admitting: *Deleted

## 2024-03-03 ENCOUNTER — Other Ambulatory Visit: Payer: Self-pay

## 2024-03-03 DIAGNOSIS — R531 Weakness: Secondary | ICD-10-CM | POA: Insufficient documentation

## 2024-03-03 DIAGNOSIS — R110 Nausea: Secondary | ICD-10-CM | POA: Insufficient documentation

## 2024-03-03 DIAGNOSIS — R6883 Chills (without fever): Secondary | ICD-10-CM | POA: Diagnosis not present

## 2024-03-03 LAB — URINALYSIS, ROUTINE W REFLEX MICROSCOPIC
Bacteria, UA: NONE SEEN
Bilirubin Urine: NEGATIVE
Glucose, UA: NEGATIVE mg/dL
Ketones, ur: NEGATIVE mg/dL
Leukocytes,Ua: NEGATIVE
Nitrite: NEGATIVE
Protein, ur: NEGATIVE mg/dL
Specific Gravity, Urine: 1.021 (ref 1.005–1.030)
pH: 6 (ref 5.0–8.0)

## 2024-03-03 LAB — COMPREHENSIVE METABOLIC PANEL WITH GFR
ALT: 14 U/L (ref 0–44)
AST: 21 U/L (ref 15–41)
Albumin: 3.9 g/dL (ref 3.5–5.0)
Alkaline Phosphatase: 42 U/L (ref 38–126)
Anion gap: 12 (ref 5–15)
BUN: 13 mg/dL (ref 6–20)
CO2: 24 mmol/L (ref 22–32)
Calcium: 9.2 mg/dL (ref 8.9–10.3)
Chloride: 103 mmol/L (ref 98–111)
Creatinine, Ser: 0.79 mg/dL (ref 0.44–1.00)
GFR, Estimated: >60 mL/min (ref >60)
Glucose, Bld: 89 mg/dL (ref 70–99)
Potassium: 3.9 mmol/L (ref 3.5–5.1)
Sodium: 139 mmol/L (ref 135–145)
Total Bilirubin: 0.6 mg/dL (ref 0.0–1.2)
Total Protein: 8.5 g/dL — ABNORMAL HIGH (ref 6.5–8.1)

## 2024-03-03 LAB — CBC
HCT: 40.6 % (ref 36.0–46.0)
Hemoglobin: 13.1 g/dL (ref 12.0–15.0)
MCH: 27.5 pg (ref 26.0–34.0)
MCHC: 32.3 g/dL (ref 30.0–36.0)
MCV: 85.3 fL (ref 80.0–100.0)
Platelets: 289 10*3/uL (ref 150–400)
RBC: 4.76 MIL/uL (ref 3.87–5.11)
RDW: 13.5 % (ref 11.5–15.5)
WBC: 4.2 10*3/uL (ref 4.0–10.5)
nRBC: 0 % (ref 0.0–0.2)

## 2024-03-03 LAB — LIPASE, BLOOD: Lipase: 44 U/L (ref 11–51)

## 2024-03-03 LAB — RESP PANEL BY RT-PCR (RSV, FLU A&B, COVID)  RVPGX2
Influenza A by PCR: NEGATIVE
Influenza B by PCR: NEGATIVE
Resp Syncytial Virus by PCR: NEGATIVE
SARS Coronavirus 2 by RT PCR: NEGATIVE

## 2024-03-03 LAB — PREGNANCY, URINE: Preg Test, Ur: NEGATIVE

## 2024-03-03 MED ORDER — ONDANSETRON 4 MG PO TBDP: 4.0000 mg | ORAL_TABLET | Freq: Three times a day (TID) | ORAL | 0 refills | Status: AC | PRN

## 2024-03-03 NOTE — ED Triage Notes (Signed)
 Pt ambulatory to triage.  Pt reports nausea for 2 weeks.  No abd pain.  No v/d.   Pt also reports bodyaches and chills.  Pt sates she feels weak.  No chest pain or sob.  Pt alert  speech clear.

## 2024-03-03 NOTE — ED Notes (Signed)
 Discharge instructions reviewed.   Newly prescribed medications discussed. Pharmacy verified.   Opportunity for questions and concerns provided.   Alert, oriented and ambulatory. Displays no signs of distress.

## 2024-03-03 NOTE — ED Provider Notes (Signed)
 [facility] Provider Note    Event Date/Time   First MD Initiated Contact with Patient 03/03/24 2032     (approximate)   History   Weakness, chills   HPI  Debbie Bowen is a 43 y.o. female who presents to the emergency department today because of concerns for some weakness and chills that started earlier today.  Patient has not had any fevers.  Denies any dysuria or change in urine.  No cough or shortness of breath.  The patient has history of chronic nausea which she states has continued. Has seen her primary care provider for the nausea who is arranging for her to see GI. The patient is not currently on an antiemetic.      Physical Exam   Triage Vital Signs: ED Triage Vitals  Encounter Vitals Group     BP 03/03/24 1946 (!) 128/99     Girls Systolic BP Percentile --      Girls Diastolic BP Percentile --      Boys Systolic BP Percentile --      Boys Diastolic BP Percentile --      Pulse Rate 03/03/24 1946 (!) 56     Resp 03/03/24 1946 18     Temp 03/03/24 1946 97.8 F (36.6 C)     Temp Source 03/03/24 1946 Oral     SpO2 03/03/24 1946 100 %     Weight 03/03/24 1947 143 lb (64.9 kg)     Height 03/03/24 1947 5' 7 (1.702 m)     Head Circumference --      Peak Flow --      Pain Score 03/03/24 1946 0     Pain Loc --      Pain Education --      Exclude from Growth Chart --     Most recent vital signs: Vitals:   03/03/24 1946 03/03/24 2100  BP: (!) 128/99 114/87  Pulse: (!) 56 61  Resp: 18 18  Temp: 97.8 F (36.6 C)   SpO2: 100% 100%   General: Awake, alert, oriented. CV:  Good peripheral perfusion. Regular rate and rhythm. Resp:  Normal effort. Lungs clear. Abd:  No distention. Non tender.   ED Results / Procedures / Treatments   Labs (all labs ordered are listed, but only abnormal results are displayed) Labs Reviewed  COMPREHENSIVE METABOLIC PANEL WITH GFR - Abnormal; Notable for the following components:      Result  Value   Total Protein 8.5 (*)    All other components within normal limits  URINALYSIS, ROUTINE W REFLEX MICROSCOPIC - Abnormal; Notable for the following components:   Color, Urine YELLOW (*)    APPearance HAZY (*)    Hgb urine dipstick LARGE (*)    All other components within normal limits  RESP PANEL BY RT-PCR (RSV, FLU A&B, COVID)  RVPGX2  LIPASE, BLOOD  CBC  PREGNANCY, URINE     EKG  None   RADIOLOGY None   PROCEDURES:  Critical Care performed: No   MEDICATIONS ORDERED IN ED: Medications - No data to display   IMPRESSION / MDM / ASSESSMENT AND PLAN / ED COURSE  I reviewed the triage vital signs and the nursing notes.                              Differential diagnosis includes, but is not limited to, viral illness, anemia, electrolyte abnormality, pregnancy, UTI  Patient's  presentation is most consistent with acute presentation with potential threat to life or bodily function.   Patient presented to the emergency department today because of concerns for chills and weakness.  Also has had a chronic nausea.  Patient's workup here is reassuring.  Blood work without concerning anemia or electrolyte abnormality.  No leukocytosis.  Urine without concerning abnormality.  At this time I do wonder if she is suffering from viral illness.  Terms of the patient's chronic nausea will give patient antiemetic and think continued workup with primary care provider is appropriate.   FINAL CLINICAL IMPRESSION(S) / ED DIAGNOSES   Final diagnoses:  Nausea  Weakness        Note:  This document was prepared using Dragon voice recognition software and may include unintentional dictation errors. MERLINDA Floy Roberts, MD 03/03/24 2149

## 2024-03-08 DIAGNOSIS — H1045 Other chronic allergic conjunctivitis: Secondary | ICD-10-CM | POA: Diagnosis not present

## 2024-03-08 DIAGNOSIS — H16223 Keratoconjunctivitis sicca, not specified as Sjogren's, bilateral: Secondary | ICD-10-CM | POA: Diagnosis not present

## 2024-03-10 DIAGNOSIS — F411 Generalized anxiety disorder: Secondary | ICD-10-CM | POA: Diagnosis not present

## 2024-03-10 DIAGNOSIS — F900 Attention-deficit hyperactivity disorder, predominantly inattentive type: Secondary | ICD-10-CM | POA: Diagnosis not present

## 2024-03-15 DIAGNOSIS — F900 Attention-deficit hyperactivity disorder, predominantly inattentive type: Secondary | ICD-10-CM | POA: Diagnosis not present

## 2024-03-15 DIAGNOSIS — F411 Generalized anxiety disorder: Secondary | ICD-10-CM | POA: Diagnosis not present

## 2024-03-21 ENCOUNTER — Other Ambulatory Visit: Payer: Self-pay | Admitting: Primary Care

## 2024-03-21 DIAGNOSIS — N390 Urinary tract infection, site not specified: Secondary | ICD-10-CM

## 2024-03-23 ENCOUNTER — Encounter: Payer: Self-pay | Admitting: Nurse Practitioner

## 2024-03-24 ENCOUNTER — Telehealth: Payer: Self-pay

## 2024-03-24 DIAGNOSIS — N390 Urinary tract infection, site not specified: Secondary | ICD-10-CM

## 2024-03-24 DIAGNOSIS — F900 Attention-deficit hyperactivity disorder, predominantly inattentive type: Secondary | ICD-10-CM | POA: Diagnosis not present

## 2024-03-24 DIAGNOSIS — F411 Generalized anxiety disorder: Secondary | ICD-10-CM | POA: Diagnosis not present

## 2024-03-24 MED ORDER — NITROFURANTOIN MACROCRYSTAL 50 MG PO CAPS: ORAL_CAPSULE | ORAL | 0 refills | Status: DC

## 2024-03-24 NOTE — Addendum Note (Signed)
 Addended by: Malayia Spizzirri K on: 03/24/2024 10:51 AM   Modules accepted: Orders

## 2024-03-24 NOTE — Telephone Encounter (Signed)
Noted. Refill(s) sent to pharmacy.  

## 2024-03-24 NOTE — Telephone Encounter (Signed)
 The prescription was denied because a refill was provided 1 month ago and she uses the medication very sparingly.  Did you pick up the refill that was sent to her pharmacy on 02/23/2024?  Has she been using the antibiotic pill more frequently?

## 2024-03-24 NOTE — Telephone Encounter (Signed)
 Called and spoke with patient she states she picked up the prescription last month but misplaced it. She thinks it is at her moms house. She is requesting 4 pills to be sent in until she can get back to her moms house to look for bottle.  States a 1 month supply typically last her 6 months because she only takes 1-2 pills every couple weeks.

## 2024-03-24 NOTE — Telephone Encounter (Signed)
 Copied from CRM [id number]. Topic: Clinical - Medication Question >> Mar 24, 2024  8:58 AM Burnard DEL wrote: Reason for CRM: Patient called in to check on status of medication nitrofurantoin  (MACRODANTIN ) 50 MG capsule refill request.Prescription was denied,patient stated that this happens each time and she has already spoken to provider about it. Patient stated that she did have a few pills left but she just misplaced them.

## 2024-03-29 DIAGNOSIS — F411 Generalized anxiety disorder: Secondary | ICD-10-CM | POA: Diagnosis not present

## 2024-03-29 DIAGNOSIS — F900 Attention-deficit hyperactivity disorder, predominantly inattentive type: Secondary | ICD-10-CM | POA: Diagnosis not present

## 2024-04-05 DIAGNOSIS — F411 Generalized anxiety disorder: Secondary | ICD-10-CM | POA: Diagnosis not present

## 2024-04-05 DIAGNOSIS — F900 Attention-deficit hyperactivity disorder, predominantly inattentive type: Secondary | ICD-10-CM | POA: Diagnosis not present

## 2024-04-12 ENCOUNTER — Ambulatory Visit: Payer: Self-pay

## 2024-04-12 NOTE — Telephone Encounter (Signed)
 Noted

## 2024-04-12 NOTE — Telephone Encounter (Signed)
 FYI Only or Action Required?: FYI only for provider.  Patient was last seen in primary care on 02/23/2024 by Gretta Comer POUR, NP.  Called Nurse Triage reporting Vaginal Discharge.  Symptoms began several days ago.  Interventions attempted: Nothing.  Symptoms are: gradually worsening.  Triage Disposition: See PCP When Office is Open (Within 3 Days)  Patient/caregiver understands and will follow disposition?: Yes   Copied from CRM #[id number]. Topic: Clinical - Medical Advice >> Apr 12, 2024  9:20 AM Viola F wrote: Reason for CRM: Patient having vaginal odor/itchiness for 2 days, she wants an appt but is out of town until 04/18/24 and wants to know what she can do? Please call [phone] (M) Reason for Disposition  Bad smelling vaginal discharge  Answer Assessment - Initial Assessment Questions 1. DISCHARGE: Describe the discharge. (e.g., white, yellow, green, gray, foamy, cottage cheese-like)     white 2. ODOR: Is there a bad odor?     Yes, fishy 3. ONSET: When did the discharge begin?     Two days ago 4. RASH: Is there a rash in the genital area? If Yes, ask: Describe it. (e.g., redness, blisters, sores, bumps)     No, just moderate itching 5. ABDOMEN PAIN: Are you having any abdomen pain? If Yes, ask: What does it feel like?  (e.g., crampy, dull, intermittent, constant)      no 6. ABDOMEN PAIN SEVERITY: If present, ask: How bad is it? (e.g., Scale 1-10; mild, moderate, or severe)     denies 7. CAUSE: What do you think is causing the discharge? Have you had the same problem before? What happened then?     Yes, long time ago 8. OTHER SYMPTOMS: Do you have any other symptoms? (e.g., fever, itching, urination pain, vaginal bleeding, vaginal foreign body)     itching 9. PREGNANCY: Is there any chance you are pregnant? When was your last menstrual period?     na  Protocols used: Vaginal Discharge-A-AH

## 2024-04-19 DIAGNOSIS — F411 Generalized anxiety disorder: Secondary | ICD-10-CM | POA: Diagnosis not present

## 2024-04-19 DIAGNOSIS — F900 Attention-deficit hyperactivity disorder, predominantly inattentive type: Secondary | ICD-10-CM | POA: Diagnosis not present

## 2024-04-27 ENCOUNTER — Ambulatory Visit (INDEPENDENT_AMBULATORY_CARE_PROVIDER_SITE_OTHER): Admitting: Family Medicine

## 2024-04-27 VITALS — BP 118/80 | HR 73 | Temp 98.1°F | Ht 67.0 in | Wt 139.4 lb

## 2024-04-27 DIAGNOSIS — Z113 Encounter for screening for infections with a predominantly sexual mode of transmission: Secondary | ICD-10-CM | POA: Diagnosis not present

## 2024-04-27 DIAGNOSIS — N898 Other specified noninflammatory disorders of vagina: Secondary | ICD-10-CM | POA: Diagnosis not present

## 2024-04-27 NOTE — Assessment & Plan Note (Signed)
 Acute, possible partially treated yeast or BV.  Will evaluate with wet prep.  Can continue boric acid until test returns.

## 2024-04-27 NOTE — Progress Notes (Signed)
 Patient ID: [name], female    DOB: 08-Sep-1980, 43 y.o.   MRN: [id number]  This visit was conducted in person.  BP 118/80   Pulse 73   Temp 98.1 F (36.7 C) (Oral)   Ht 5' 7 (1.702 m)   Wt 139 lb 6.4 oz (63.2 kg)   SpO2 97%   BMI 21.83 kg/m    CC:  Chief Complaint  Patient presents with   Vaginitis    Pt complains of having a yeast infection that started 8/18 and states that it has not gone away. Pt tried boric acid but complains after a day or two symptoms came back. Slight odor,itching.     Subjective:   HPI: Ekaterini Dorethea Strubel is a 43 y.o. female presenting on 04/27/2024 for Vaginitis (Pt complains of having a yeast infection that started 8/18 and states that it has not gone away. Pt tried boric acid but complains after a day or two symptoms came back. Slight odor,itching. )  Patient of Mallie with new symptoms in the last 9 days of vaginal itching, odor and discharge that has not improved. Did E visit.SABRA treated with fluconazole  and metronidazole . She has tried to treat this with boric acid which seems to improve symptoms but then they come back.   Currently having milder itching and small ampount of discharge, thicker and white.  NO fever, no dysuria.   Hx of reccuring BV, none in last few years.   She has not been tested for STI recently.  She has new partner, no known exposure.      Relevant past medical, surgical, family and social history reviewed and updated as indicated. Interim medical history since our last visit reviewed. Allergies and medications reviewed and updated. Outpatient Medications Prior to Visit  Medication Sig Dispense Refill   atomoxetine  (STRATTERA ) 25 MG capsule Take 1 capsule (25 mg total) by mouth daily. For ADHD 30 capsule 0   cholecalciferol (VITAMIN D3) 25 MCG (1000 UNIT) tablet Take 1,000 Units by mouth daily.     famotidine  (PEPCID ) 20 MG tablet Take 1 tablet (20 mg total) by mouth 2 (two) times daily. for  nausea. 180 tablet 0   ibuprofen (ADVIL) 200 MG tablet Take 200 mg by mouth as needed.     nitrofurantoin  (MACRODANTIN ) 50 MG capsule Take 1 capsule (50 mg total) by mouth as needed. For UTI prevention. 30 capsule 0   nitrofurantoin  (MACRODANTIN ) 50 MG capsule Take 1 capsule by mouth after intercourse if needed for UTI prevention 5 capsule 0   ondansetron  (ZOFRAN -ODT) 4 MG disintegrating tablet Take 1 tablet (4 mg total) by mouth every 8 (eight) hours as needed for nausea or vomiting. 20 tablet 0   Prenatal Multivit-Min-Fe-FA (PRENATAL, W/IRON & FA,) 27-0.8 MG TABS Take 1 tablet by mouth daily.     tretinoin (RETIN-A) 0.05 % cream Apply topically at bedtime.     albuterol  (VENTOLIN  HFA) 108 (90 Base) MCG/ACT inhaler Inhale 1-2 puffs into the lungs every 6 (six) hours as needed for shortness of breath. (Patient not taking: Reported on 04/27/2024) 1 each 0   No facility-administered medications prior to visit.     Per HPI unless specifically indicated in ROS section below Review of Systems  Constitutional:  Negative for fatigue and fever.  HENT:  Negative for congestion.   Eyes:  Negative for pain.  Respiratory:  Negative for cough and shortness of breath.   Cardiovascular:  Negative for chest pain, palpitations and leg swelling.  Gastrointestinal:  Negative for abdominal pain.  Genitourinary:  Positive for vaginal discharge. Negative for dysuria and vaginal bleeding.  Musculoskeletal:  Negative for back pain.  Neurological:  Negative for syncope, light-headedness and headaches.  Psychiatric/Behavioral:  Negative for dysphoric mood.    Objective:  BP 118/80   Pulse 73   Temp 98.1 F (36.7 C) (Oral)   Ht 5' 7 (1.702 m)   Wt 139 lb 6.4 oz (63.2 kg)   SpO2 97%   BMI 21.83 kg/m   Wt Readings from Last 3 Encounters:  04/27/24 139 lb 6.4 oz (63.2 kg)  03/03/24 143 lb (64.9 kg)  02/23/24 147 lb (66.7 kg)      Physical Exam Exam conducted with a chaperone present.  Constitutional:       General: She is not in acute distress.    Appearance: Normal appearance. She is well-developed. She is not ill-appearing or toxic-appearing.  HENT:     Head: Normocephalic.     Right Ear: Hearing, tympanic membrane, ear canal and external ear normal. Tympanic membrane is not erythematous, retracted or bulging.     Left Ear: Hearing, tympanic membrane, ear canal and external ear normal. Tympanic membrane is not erythematous, retracted or bulging.     Nose: No mucosal edema or rhinorrhea.     Right Sinus: No maxillary sinus tenderness or frontal sinus tenderness.     Left Sinus: No maxillary sinus tenderness or frontal sinus tenderness.     Mouth/Throat:     Pharynx: Uvula midline.  Eyes:     General: Lids are normal. Lids are everted, no foreign bodies appreciated.     Conjunctiva/sclera: Conjunctivae normal.     Pupils: Pupils are equal, round, and reactive to light.  Neck:     Thyroid : No thyroid  mass or thyromegaly.     Vascular: No carotid bruit.     Trachea: Trachea normal.  Cardiovascular:     Rate and Rhythm: Normal rate and regular rhythm.     Pulses: Normal pulses.     Heart sounds: Normal heart sounds, S1 normal and S2 normal. No murmur heard.    No friction rub. No gallop.  Pulmonary:     Effort: Pulmonary effort is normal. No tachypnea or respiratory distress.     Breath sounds: Normal breath sounds. No decreased breath sounds, wheezing, rhonchi or rales.  Abdominal:     General: Bowel sounds are normal.     Palpations: Abdomen is soft.     Tenderness: There is no abdominal tenderness.     Hernia: There is no hernia in the right inguinal area.  Genitourinary:    Exam position: Supine.     Pubic Area: No rash.      Labia:        Right: No rash.        Left: No rash.      Vagina: Vaginal discharge present.     Cervix: Normal. No cervical motion tenderness or discharge.     Uterus: Normal.      Adnexa: Right adnexa normal and left adnexa normal.   Musculoskeletal:     Cervical back: Normal range of motion and neck supple.  Skin:    General: Skin is warm and dry.     Findings: No rash.  Neurological:     Mental Status: She is alert.  Psychiatric:        Mood and Affect: Mood is not anxious or depressed.        Speech:  Speech normal.        Behavior: Behavior normal. Behavior is cooperative.        Thought Content: Thought content normal.        Judgment: Judgment normal.       Results for orders placed or performed during the hospital encounter of 03/03/24  Lipase, blood   Collection Time: 03/03/24  7:48 PM  Result Value Ref Range   Lipase 44 11 - 51 U/L  Comprehensive metabolic panel   Collection Time: 03/03/24  7:48 PM  Result Value Ref Range   Sodium 139 135 - 145 mmol/L   Potassium 3.9 3.5 - 5.1 mmol/L   Chloride 103 98 - 111 mmol/L   CO2 24 22 - 32 mmol/L   Glucose, Bld 89 70 - 99 mg/dL   BUN 13 6 - 20 mg/dL   Creatinine, Ser 9.20 0.44 - 1.00 mg/dL   Calcium 9.2 8.9 - 89.6 mg/dL   Total Protein 8.5 (H) 6.5 - 8.1 g/dL   Albumin 3.9 3.5 - 5.0 g/dL   AST 21 15 - 41 U/L   ALT 14 0 - 44 U/L   Alkaline Phosphatase 42 38 - 126 U/L   Total Bilirubin 0.6 0.0 - 1.2 mg/dL   GFR, Estimated >39 >39 mL/min   Anion gap 12 5 - 15  CBC   Collection Time: 03/03/24  7:48 PM  Result Value Ref Range   WBC 4.2 4.0 - 10.5 K/uL   RBC 4.76 3.87 - 5.11 MIL/uL   Hemoglobin 13.1 12.0 - 15.0 g/dL   HCT 59.3 63.9 - 53.9 %   MCV 85.3 80.0 - 100.0 fL   MCH 27.5 26.0 - 34.0 pg   MCHC 32.3 30.0 - 36.0 g/dL   RDW 86.4 88.4 - 84.4 %   Platelets 289 150 - 400 K/uL   nRBC 0.0 0.0 - 0.2 %  Urinalysis, Routine w reflex microscopic -Urine, Clean Catch   Collection Time: 03/03/24  7:48 PM  Result Value Ref Range   Color, Urine YELLOW (A) YELLOW   APPearance HAZY (A) CLEAR   Specific Gravity, Urine 1.021 1.005 - 1.030   pH 6.0 5.0 - 8.0   Glucose, UA NEGATIVE NEGATIVE mg/dL   Hgb urine dipstick LARGE (A) NEGATIVE   Bilirubin Urine  NEGATIVE NEGATIVE   Ketones, ur NEGATIVE NEGATIVE mg/dL   Protein, ur NEGATIVE NEGATIVE mg/dL   Nitrite NEGATIVE NEGATIVE   Leukocytes,Ua NEGATIVE NEGATIVE   RBC / HPF 0-5 0 - 5 RBC/hpf   WBC, UA 0-5 0 - 5 WBC/hpf   Bacteria, UA NONE SEEN NONE SEEN   Squamous Epithelial / HPF 6-10 0 - 5 /HPF   Mucus PRESENT   Resp panel by RT-PCR (RSV, Flu A&B, Covid) Anterior Nasal Swab   Collection Time: 03/03/24  7:49 PM   Specimen: Anterior Nasal Swab  Result Value Ref Range   SARS Coronavirus 2 by RT PCR NEGATIVE NEGATIVE   Influenza A by PCR NEGATIVE NEGATIVE   Influenza B by PCR NEGATIVE NEGATIVE   Resp Syncytial Virus by PCR NEGATIVE NEGATIVE  Pregnancy, urine   Collection Time: 03/03/24  7:49 PM  Result Value Ref Range   Preg Test, Ur NEGATIVE NEGATIVE    Assessment and Plan  Vaginal discharge Assessment & Plan:  Acute, possible partially treated yeast or BV.  Will evaluate with wet prep.  Can continue boric acid until test returns.  Orders: -     WET PREP BY MOLECULAR PROBE  Screening examination for STI -     HIV Antibody (routine testing w rflx) -     RPR -     Hepatitis panel, acute -     GC/Chlamydia Probe Amp -     HSV(herpes simplex vrs) 1+2 ab-IgG    No follow-ups on file.   Greig Ring, MD

## 2024-04-27 NOTE — Addendum Note (Signed)
 Addended by: HOPE VEVA PARAS on: 04/27/2024 09:26 AM   Modules accepted: Orders

## 2024-04-28 DIAGNOSIS — F900 Attention-deficit hyperactivity disorder, predominantly inattentive type: Secondary | ICD-10-CM | POA: Diagnosis not present

## 2024-04-28 DIAGNOSIS — F411 Generalized anxiety disorder: Secondary | ICD-10-CM | POA: Diagnosis not present

## 2024-04-29 ENCOUNTER — Ambulatory Visit: Payer: Self-pay | Admitting: Family Medicine

## 2024-04-29 LAB — HEPATITIS PANEL, ACUTE
Hep A IgM: NONREACTIVE
Hep B C IgM: NONREACTIVE
Hepatitis B Surface Ag: NONREACTIVE
Hepatitis C Ab: NONREACTIVE

## 2024-04-29 LAB — WET PREP BY MOLECULAR PROBE
Candida species: NOT DETECTED
Gardnerella vaginalis: NOT DETECTED
MICRO NUMBER:: 16896216
SPECIMEN QUALITY:: ADEQUATE
Trichomonas vaginosis: NOT DETECTED

## 2024-04-29 LAB — RPR: RPR Ser Ql: NONREACTIVE

## 2024-04-29 LAB — HSV(HERPES SIMPLEX VRS) I + II AB-IGG
HSV 1 IGG,TYPE SPECIFIC AB: 42 {index} — ABNORMAL HIGH
HSV 2 IGG,TYPE SPECIFIC AB: <0.9 {index}

## 2024-04-29 LAB — C. TRACHOMATIS/N. GONORRHOEAE RNA
C. trachomatis RNA, TMA: NOT DETECTED
N. gonorrhoeae RNA, TMA: NOT DETECTED

## 2024-04-29 LAB — HIV ANTIBODY (ROUTINE TESTING W REFLEX): HIV 1&2 Ab, 4th Generation: NONREACTIVE

## 2024-05-09 ENCOUNTER — Other Ambulatory Visit: Payer: Self-pay | Admitting: Primary Care

## 2024-05-09 DIAGNOSIS — N390 Urinary tract infection, site not specified: Secondary | ICD-10-CM

## 2024-05-10 ENCOUNTER — Other Ambulatory Visit: Payer: Self-pay | Admitting: Primary Care

## 2024-05-10 DIAGNOSIS — F411 Generalized anxiety disorder: Secondary | ICD-10-CM | POA: Diagnosis not present

## 2024-05-10 DIAGNOSIS — N390 Urinary tract infection, site not specified: Secondary | ICD-10-CM

## 2024-05-10 DIAGNOSIS — F900 Attention-deficit hyperactivity disorder, predominantly inattentive type: Secondary | ICD-10-CM | POA: Diagnosis not present

## 2024-05-10 MED ORDER — NITROFURANTOIN MACROCRYSTAL 50 MG PO CAPS: 50.0000 mg | ORAL_CAPSULE | ORAL | 0 refills | Status: DC | PRN

## 2024-05-12 DIAGNOSIS — F411 Generalized anxiety disorder: Secondary | ICD-10-CM | POA: Diagnosis not present

## 2024-05-12 DIAGNOSIS — F900 Attention-deficit hyperactivity disorder, predominantly inattentive type: Secondary | ICD-10-CM | POA: Diagnosis not present

## 2024-05-17 ENCOUNTER — Ambulatory Visit: Admitting: Nurse Practitioner

## 2024-05-17 ENCOUNTER — Encounter: Payer: Self-pay | Admitting: Nurse Practitioner

## 2024-05-17 VITALS — BP 100/76 | HR 76 | Ht 66.5 in | Wt 144.2 lb

## 2024-05-17 DIAGNOSIS — R1011 Right upper quadrant pain: Secondary | ICD-10-CM

## 2024-05-17 DIAGNOSIS — K219 Gastro-esophageal reflux disease without esophagitis: Secondary | ICD-10-CM

## 2024-05-17 DIAGNOSIS — R112 Nausea with vomiting, unspecified: Secondary | ICD-10-CM

## 2024-05-17 DIAGNOSIS — K5900 Constipation, unspecified: Secondary | ICD-10-CM | POA: Diagnosis not present

## 2024-05-17 NOTE — Patient Instructions (Signed)
 If nausea recurs start Famotidine  20mg  one tab twice daily   No THC gummies  Recommend eating 3 to 4 small snack sized meals daily   Take Miralax 1 capful mixed in 8 ounces of water at bed time for constipation as tolerated.   You will be contacted by [facility] Scheduling in the next 2 days to arrange a RUQ ultrasound. The number on your caller ID will be [phone], please answer when they call.  If you have not heard from them in 2 days please call [phone] to schedule.     You have been scheduled for an endoscopy. Please follow written instructions given to you at your visit today.  If you use inhalers (even only as needed), please bring them with you on the day of your procedure.  If you take any of the following medications, they will need to be adjusted prior to your procedure:   DO NOT TAKE 7 DAYS PRIOR TO TEST- Trulicity (dulaglutide) Ozempic, Wegovy (semaglutide) Mounjaro (tirzepatide) Bydureon Bcise (exanatide extended release)  DO NOT TAKE 1 DAY PRIOR TO YOUR TEST Rybelsus (semaglutide) Adlyxin (lixisenatide) Victoza (liraglutide) Byetta (exanatide) ___________________________________________________________________________   Due to recent changes in healthcare laws, you may see the results of your imaging and laboratory studies on MyChart before your provider has had a chance to review them.  We understand that in some cases there may be results that are confusing or concerning to you. Not all laboratory results come back in the same time frame and the provider may be waiting for multiple results in order to interpret others.  Please give us  48 hours in order for your provider to thoroughly review all the results before contacting the office for clarification of your results.   Thank you for trusting me with your gastrointestinal care!   Elida Shawl, CRNP

## 2024-05-17 NOTE — Progress Notes (Unsigned)
 05/17/2024 Debbie Bowen [id number] 04-Mar-1981   CHIEF COMPLAINT:   HISTORY OF PRESENT ILLNESS: Debbie Bowen is a 43 year old female with a past medical history of anxiety, attention deficit disorder, hyperlipidemia, scoliosis, GERD and chronic nausea. She presents to our office today as referred by Comer Gaskins NP for further evaluation regarding nausea, vomiting, abdominal pain and constipation. She describes having nausea which comes and goes and started 3 to 4 months ago which sometimes lasts for a few hours and recently has occurred less frequently. She had one episode of abrupt onset vomiting without warning. She's vomited while eating 6 to 7 times within the past 4 months, last episode was in July. No specific food triggers. No hematemesis. No nausea for the past week. She has acid reflux if she eats tomato or foods with curry and has epigastric pain a few times monthly. She takes Famotidine  as needed. Stress level is elevated secondary to divorce process. No weight loss. She previously took Ibuprofen 800mg  tid x 6 days during her menstrual cycles for cramping, for the past year has reduced to taking Ibuprofen a few days during her cycles. Nonsmoker. No alcohol use. Takes THC gummies once weekly. She underwent an EGD  due to having nausea 10/2015 by Dr. Jinny which was normal. She has occasional constipation. No bloody or black stools. Never had a colonoscopy.      Latest Ref Rng & Units 03/03/2024    7:48 PM 02/24/2024    2:45 PM 06/20/2022   10:03 AM  CBC  WBC 4.0 - 10.5 K/uL 4.2  5.6  4.0   Hemoglobin 12.0 - 15.0 g/dL 86.8  86.6  86.9   Hematocrit 36.0 - 46.0 % 40.6  39.9  39.8   Platelets 150 - 400 K/uL 289  303.0  303.0        Latest Ref Rng & Units 03/03/2024    7:48 PM 02/24/2024    2:45 PM 05/26/2023    2:54 PM  CMP  Glucose 70 - 99 mg/dL 89  89  84   BUN 6 - 20 mg/dL 13  19  11    Creatinine 0.44 - 1.00 mg/dL 9.20  9.19  9.21   Sodium 135 - 145 mmol/L  139  136  136   Potassium 3.5 - 5.1 mmol/L 3.9  4.2  4.0   Chloride 98 - 111 mmol/L 103  102  101   CO2 22 - 32 mmol/L 24  26  28    Calcium 8.9 - 10.3 mg/dL 9.2  9.2  9.5   Total Protein 6.5 - 8.1 g/dL 8.5  7.8  8.2   Total Bilirubin 0.0 - 1.2 mg/dL 0.6  0.3  0.3   Alkaline Phos 38 - 126 U/L 42  41  49   AST 15 - 41 U/L 21  16  15    ALT 0 - 44 U/L 14  11  11      EGD 10/04/2015: - Normal esophagus.  - Normal stomach.  - Normal examined duodenum.  - Two random biopsies were obtained in the lower third of the esophagus. Esophagus, biopsy, distal - BENIGN SQUAMOUS MUCOSA WITH PAPILLARY FEATURES, SEE COMMENT. - NEGATIVE FOR INTESTINAL METAPLASIA, DYSPLASIA AND MALIGNANCY  Past Medical History:  Diagnosis Date   Acute knee pain 07/18/2022   Acute wrist pain, left 01/06/2023   ADD (attention deficit disorder)    No medications   BV (bacterial vaginosis)    recurrent   Communication  problem 03/20/2020   Eczema    Facial pain 09/22/2019   GERD (gastroesophageal reflux disease)    Insomnia    Orthodontics    invisalign   Pain in thumb joint with movement of right hand 07/11/2021   Scoliosis    no issues   Urticaria    UTI (lower urinary tract infection)    recurrent   Wears contact lenses    Past Surgical History:  Procedure Laterality Date   CERVIX LESION DESTRUCTION     DILATION AND CURETTAGE OF UTERUS     for TAB   ESOPHAGOGASTRODUODENOSCOPY (EGD) WITH PROPOFOL  N/A 10/04/2015   Procedure: ESOPHAGOGASTRODUODENOSCOPY (EGD) WITH PROPOFOL ;  Surgeon: Rogelia Copping, MD;  Location: [facility];  Service: Endoscopy;  Laterality: N/A;   Social History: She is married undergoing divorce process. She is an Nature conservation officer. No alcohol use. Takes THC gummies once weekly. Nonsmoker.   Family History: family history includes Asthma in her brother and sister; Breast cancer in her paternal grandmother; Breast cancer (age of onset: 69) in her paternal aunt; Breast cancer (age of  onset: 7) in her mother; Breast cancer (age of onset: 42) in her paternal aunt; Diabetes in her maternal aunt, maternal grandmother, mother, paternal aunt, paternal aunt, paternal aunt, and paternal grandmother; Eczema in her father; Heart failure in her mother; Juvenile idiopathic arthritis in her father; Prostate cancer in her maternal grandfather and paternal grandfather. No known family history of esophageal, gastric or colon cancer.   No Known Allergies   Outpatient Encounter Medications as of 05/17/2024  Medication Sig   albuterol  (VENTOLIN  HFA) 108 (90 Base) MCG/ACT inhaler Inhale 1-2 puffs into the lungs every 6 (six) hours as needed for shortness of breath. (Patient not taking: Reported on 04/27/2024)   atomoxetine  (STRATTERA ) 25 MG capsule Take 1 capsule (25 mg total) by mouth daily. For ADHD   cholecalciferol (VITAMIN D3) 25 MCG (1000 UNIT) tablet Take 1,000 Units by mouth daily.   famotidine  (PEPCID ) 20 MG tablet Take 1 tablet (20 mg total) by mouth 2 (two) times daily. for nausea.   ibuprofen (ADVIL) 200 MG tablet Take 200 mg by mouth as needed.   nitrofurantoin  (MACRODANTIN ) 50 MG capsule Take 1 capsule by mouth after intercourse as needed for UTI prevention.   ondansetron  (ZOFRAN -ODT) 4 MG disintegrating tablet Take 1 tablet (4 mg total) by mouth every 8 (eight) hours as needed for nausea or vomiting.   Prenatal Multivit-Min-Fe-FA (PRENATAL, W/IRON & FA,) 27-0.8 MG TABS Take 1 tablet by mouth daily.   tretinoin (RETIN-A) 0.05 % cream Apply topically at bedtime.   No facility-administered encounter medications on file as of 05/17/2024.    REVIEW OF SYSTEMS:  Gen: Denies fever, sweats or chills. No weight loss.  CV: Denies chest pain, palpitations or edema. Resp: Denies cough, shortness of breath of hemoptysis.  GI: See HPI. GU: Denies urinary burning, blood in urine, increased urinary frequency or incontinence. MS: Denies joint pain, muscles aches or weakness. Derm: Denies rash,  itchiness, skin lesions or unhealing ulcers. Psych: See HPI.  Heme: Denies bruising, easy bleeding. Neuro:  Denies headaches, dizziness or paresthesias. Endo:  Denies any problems with DM, thyroid  or adrenal function.  PHYSICAL EXAM:  BP 100/76 (BP Location: Left Arm, Patient Position: Sitting, Cuff Size: Normal)   Pulse 76   Ht 5' 6.5 (1.689 m) Comment: height measured without shoes  Wt 144 lb 4 oz (65.4 kg)   LMP 04/22/2024   BMI 22.93 kg/m   General:  43 year old female in no acute distress. Head: Normocephalic and atraumatic. Eyes:  Sclerae non-icteric, conjunctive pink. Ears: Normal auditory acuity. Mouth: Dentition intact. No ulcers or lesions.  Neck: Supple, no lymphadenopathy or thyromegaly.  Lungs: Clear bilaterally to auscultation without wheezes, crackles or rhonchi. Heart: Regular rate and rhythm. No murmur, rub or gallop appreciated.  Abdomen: Soft, nondistended. Nontender. No masses. No hepatosplenomegaly. Normoactive bowel sounds x 4 quadrants.  Rectal: Deferred.  Musculoskeletal: Symmetrical with no gross deformities. Skin: Warm and dry. No rash or lesions on visible extremities. Extremities: No edema. Neurological: Alert oriented x 4, no focal deficits.  Psychological: Alert and cooperative. Normal mood and affect.  ASSESSMENT AND PLAN:  43 year old female with N/V, reflux and epigastric pain. Normal EGD in 2017. Chronic NSAID use during menstrual cycles. Increased stress level.  - GERD diet handout, eat 3 to 4 small snack sized meals daily  - Avoid NSAIDs - Famotidine  20mg  po bid - RUQ sonogram  - EGD benefits and risks discussed including risk with sedation, risk of bleeding, perforation and infection   Constipation  - Miralax Q HS as needed - Drink 64 ounces of water daily - Fiber diet as tolerated   Colon cancer screening  - Recommend colonoscopy at age 27     CC:  Gretta Comer POUR, NP

## 2024-05-18 NOTE — Progress Notes (Signed)
 Agree with assessment and plan as outlined.

## 2024-05-20 ENCOUNTER — Encounter: Payer: Self-pay | Admitting: Gastroenterology

## 2024-05-23 ENCOUNTER — Ambulatory Visit: Admitting: Primary Care

## 2024-05-24 ENCOUNTER — Ambulatory Visit (HOSPITAL_COMMUNITY)
Admission: RE | Admit: 2024-05-24 | Discharge: 2024-05-24 | Disposition: A | Discharge status: Home / Self Care | Source: Ambulatory Visit | Attending: Nurse Practitioner | Admitting: Nurse Practitioner

## 2024-05-24 DIAGNOSIS — K219 Gastro-esophageal reflux disease without esophagitis: Secondary | ICD-10-CM | POA: Insufficient documentation

## 2024-05-24 DIAGNOSIS — R1011 Right upper quadrant pain: Secondary | ICD-10-CM | POA: Insufficient documentation

## 2024-05-24 DIAGNOSIS — K5900 Constipation, unspecified: Secondary | ICD-10-CM | POA: Insufficient documentation

## 2024-05-24 DIAGNOSIS — R112 Nausea with vomiting, unspecified: Secondary | ICD-10-CM | POA: Diagnosis not present

## 2024-05-24 DIAGNOSIS — R7989 Other specified abnormal findings of blood chemistry: Secondary | ICD-10-CM | POA: Diagnosis not present

## 2024-05-26 ENCOUNTER — Ambulatory Visit: Payer: Self-pay | Admitting: Nurse Practitioner

## 2024-05-30 ENCOUNTER — Ambulatory Visit: Admitting: Gastroenterology

## 2024-05-30 ENCOUNTER — Encounter: Payer: Self-pay | Admitting: Gastroenterology

## 2024-05-30 VITALS — BP 114/73 | HR 72 | Temp 97.7°F | Resp 19 | Ht 66.0 in | Wt 144.0 lb

## 2024-05-30 DIAGNOSIS — R1013 Epigastric pain: Secondary | ICD-10-CM | POA: Diagnosis not present

## 2024-05-30 DIAGNOSIS — K219 Gastro-esophageal reflux disease without esophagitis: Secondary | ICD-10-CM | POA: Diagnosis not present

## 2024-05-30 DIAGNOSIS — R112 Nausea with vomiting, unspecified: Secondary | ICD-10-CM | POA: Diagnosis not present

## 2024-05-30 DIAGNOSIS — K2950 Unspecified chronic gastritis without bleeding: Secondary | ICD-10-CM

## 2024-05-30 MED ORDER — SODIUM CHLORIDE 0.9 % IV SOLN: 500.0000 mL | INTRAVENOUS | Status: DC

## 2024-05-30 NOTE — Progress Notes (Signed)
 History and Physical Interval Note: seen on 05/17/24 - no interval change. EGD to further evaluate ongoing symptoms of nausea / vomiting, reflux, epigastric pain. On pepcid  20mg  BID. History of NSAID use. RUQ US  showed no gallstones. She otherwise feels well without complaints today - she wishes to proceed.     05/30/2024 10:22 AM  Debbie Bowen  has presented today for endoscopic procedure(s), with the diagnosis of  Encounter Diagnoses  Name Primary?   Nausea and vomiting, unspecified vomiting type Yes   Gastroesophageal reflux disease, unspecified whether esophagitis present    Abdominal pain, epigastric   .  The various methods of evaluation and treatment have been discussed with the patient and/or family. After consideration of risks, benefits and other options for treatment, the patient has consented to  the endoscopic procedure(s).   The patient's history has been reviewed, patient examined, no change in status, stable for surgery.  I have reviewed the patient's chart and labs.  Questions were answered to the patient's satisfaction.    Marcey Naval, MD [facility]

## 2024-05-30 NOTE — Progress Notes (Signed)
 Report given to PACU, vss

## 2024-05-30 NOTE — Progress Notes (Signed)
 Pt's states no medical or surgical changes since previsit or office visit.

## 2024-05-30 NOTE — Patient Instructions (Addendum)
 Resume previous diet. Continue present medications.  Continue Pepcid  twice daily for a few weeks to prevent symptoms Await pathology results.  YOU HAD AN ENDOSCOPIC PROCEDURE TODAY AT THE [facility]:   Refer to the procedure report that was given to you for any specific questions about what was found during the examination.  If the procedure report does not answer your questions, please call your gastroenterologist to clarify.  If you requested that your care partner not be given the details of your procedure findings, then the procedure report has been included in a sealed envelope for you to review at your convenience later.  YOU SHOULD EXPECT: Some feelings of bloating in the abdomen. Passage of more gas than usual.  Walking can help get rid of the air that was put into your GI tract during the procedure and reduce the bloating. If you had a lower endoscopy (such as a colonoscopy or flexible sigmoidoscopy) you may notice spotting of blood in your stool or on the toilet paper. If you underwent a bowel prep for your procedure, you may not have a normal bowel movement for a few days.  Please Note:  You might notice some irritation and congestion in your nose or some drainage.  This is from the oxygen used during your procedure.  There is no need for concern and it should clear up in a day or so.  SYMPTOMS TO REPORT IMMEDIATELY:  Following upper endoscopy (EGD)  Vomiting of blood or coffee ground material  New chest pain or pain under the shoulder blades  Painful or persistently difficult swallowing  New shortness of breath  Fever of 100F or higher  Black, tarry-looking stools  For urgent or emergent issues, a gastroenterologist can be reached at any hour by calling [phone]. Do not use MyChart messaging for urgent concerns.    DIET:  We do recommend a small meal at first, but then you may proceed to your regular diet.  Drink plenty of fluids but you should avoid  alcoholic beverages for 24 hours.  ACTIVITY:  You should plan to take it easy for the rest of today and you should NOT DRIVE or use heavy machinery until tomorrow (because of the sedation medicines used during the test).    FOLLOW UP: Our staff will call the number listed on your records the next business day following your procedure.  We will call around 7:15- 8:00 am to check on you and address any questions or concerns that you may have regarding the information given to you following your procedure. If we do not reach you, we will leave a message.     If any biopsies were taken you will be contacted by phone or by letter within the next 1-3 weeks.  Please call us  at [phone] if you have not heard about the biopsies in 3 weeks.    SIGNATURES/CONFIDENTIALITY: You and/or your care partner have signed paperwork which will be entered into your electronic medical record.  These signatures attest to the fact that that the information above on your After Visit Summary has been reviewed and is understood.  Full responsibility of the confidentiality of this discharge information lies with you and/or your care-partner.

## 2024-05-30 NOTE — Progress Notes (Signed)
 Called to room to assist during endoscopic procedure.  Patient ID and intended procedure confirmed with present staff. Received instructions for my participation in the procedure from the performing physician.

## 2024-05-30 NOTE — Op Note (Signed)
 [facility] Patient Name: Debbie Bowen Procedure Date: 05/30/2024 10:14 AM MRN: [id number] Endoscopist: Elspeth P. Leigh , MD, [id number] Age: 43 Referring MD:  Date of Birth: 10-Nov-1980 Gender: Female Account #: [id number] Procedure:                Upper GI endoscopy Indications:              Epigastric abdominal pain, follow-up of                            gastro-esophageal reflux disease, Nausea with                            vomiting - history of NSAID use, on pepcid  PRN. RUQ                            US  negative for gallstones. She has felt some                            improvement in recent weeks Medicines:                Monitored Anesthesia Care Procedure:                Pre-Anesthesia Assessment:                           - Prior to the procedure, a History and Physical                            was performed, and patient medications and                            allergies were reviewed. The patient's tolerance of                            previous anesthesia was also reviewed. The risks                            and benefits of the procedure and the sedation                            options and risks were discussed with the patient.                            All questions were answered, and informed consent                            was obtained. Prior Anticoagulants: The patient has                            taken no anticoagulant or antiplatelet agents. ASA                            Grade Assessment: II - A patient with mild systemic  disease. After reviewing the risks and benefits,                            the patient was deemed in satisfactory condition to                            undergo the procedure.                           After obtaining informed consent, the endoscope was                            passed under direct vision. Throughout the                            procedure, the patient's blood  pressure, pulse, and                            oxygen saturations were monitored continuously. The                            Olympus scope [id number] was introduced through the                            mouth, and advanced to the second part of duodenum.                            The upper GI endoscopy was accomplished without                            difficulty. The patient tolerated the procedure                            well. Scope In: Scope Out: Findings:                 Esophagogastric landmarks were identified: the                            Z-line was found at 42 cm, the gastroesophageal                            junction was found at 42 cm and the upper extent of                            the gastric folds was found at 42 cm from the                            incisors.                           The exam of the esophagus was otherwise normal.                           The entire examined stomach was  normal. Biopsies                            were taken with a cold forceps for Helicobacter                            pylori testing.                           The examined duodenum was normal. Complications:            No immediate complications. Estimated blood loss:                            Minimal. Estimated Blood Loss:     Estimated blood loss was minimal. Impression:               - Esophagogastric landmarks identified.                           - Normal esophagus.                           - Normal stomach. Biopsied.                           - Normal examined duodenum. Recommendation:           - Patient has a contact number available for                            emergencies. The signs and symptoms of potential                            delayed complications were discussed with the                            patient. Return to normal activities tomorrow.                            Written discharge instructions were provided to the                             patient.                           - Resume previous diet.                           - Continue present medications.                           - Trial of pepcid  twice daily for a few week trial                            to prevent symptoms vs. trial of omeprazole  20mg  /  day - will discuss with the patient                           - Await pathology results. Elspeth P. Leigh, MD 05/30/2024 10:47:58 AM This report has been signed electronically.

## 2024-05-31 ENCOUNTER — Ambulatory Visit (INDEPENDENT_AMBULATORY_CARE_PROVIDER_SITE_OTHER): Admitting: Family Medicine

## 2024-05-31 ENCOUNTER — Telehealth: Payer: Self-pay

## 2024-05-31 ENCOUNTER — Encounter: Payer: Self-pay | Admitting: Family Medicine

## 2024-05-31 VITALS — BP 110/76 | HR 67 | Temp 98.0°F | Ht 67.0 in | Wt 141.5 lb

## 2024-05-31 DIAGNOSIS — N898 Other specified noninflammatory disorders of vagina: Secondary | ICD-10-CM

## 2024-05-31 NOTE — Telephone Encounter (Signed)
 Attempted to reach patient for follow up phone call. No answer, left voicemail to contact Dr. Hassan office with any questions or concerns.

## 2024-05-31 NOTE — Progress Notes (Signed)
 Patient ID: Debbie Bowen, female    DOB: Jun 05, 1981, 43 y.o.   MRN: [id number]  This visit was conducted in person.  BP 110/76   Pulse 67   Temp 98 F (36.7 C) (Temporal)   Ht 5' 7 (1.702 m)   Wt 141 lb 8 oz (64.2 kg)   LMP 05/19/2024   SpO2 98%   BMI 22.16 kg/m    CC:  Chief Complaint  Patient presents with   Vaginal Discharge    Wants to be tested for BV and Yeast    Subjective:   HPI: Debbie Bowen is a 43 y.o. female patient of Debbie Gaskins, NP, presenting on 05/31/2024 for Vaginal Discharge (Wants to be tested for BV and Yeast)   She has been having  continued vaginal discharge in last month.  Discharge is thick and white.  No itching.   She  took  macrodantin  2 days ago for UTI prevention.  No dysuria.    Has not used OTC meds, or boric acid recently.    She has history of frequent BV  infections.      Relevant past medical, surgical, family and social history reviewed and updated as indicated. Interim medical history since our last visit reviewed. Allergies and medications reviewed and updated. Outpatient Medications Prior to Visit  Medication Sig Dispense Refill   albuterol  (VENTOLIN  HFA) 108 (90 Base) MCG/ACT inhaler Inhale 1-2 puffs into the lungs every 6 (six) hours as needed for shortness of breath. 1 each 0   amoxicillin  (AMOXIL ) 500 MG capsule Take 500 mg by mouth 3 (three) times daily.     atomoxetine  (STRATTERA ) 25 MG capsule Take 1 capsule (25 mg total) by mouth daily. For ADHD 30 capsule 0   cholecalciferol (VITAMIN D3) 25 MCG (1000 UNIT) tablet Take 1,000 Units by mouth daily.     famotidine  (PEPCID ) 20 MG tablet Take 1 tablet (20 mg total) by mouth 2 (two) times daily. for nausea. 180 tablet 0   ibuprofen (ADVIL) 200 MG tablet Take 200 mg by mouth as needed.     nitrofurantoin  (MACRODANTIN ) 50 MG capsule Take 1 capsule by mouth after intercourse as needed for UTI prevention. 30 capsule 0   ondansetron  (ZOFRAN -ODT) 4 MG  disintegrating tablet Take 1 tablet (4 mg total) by mouth every 8 (eight) hours as needed for nausea or vomiting. 20 tablet 0   tretinoin (RETIN-A) 0.05 % cream Apply topically at bedtime.     No facility-administered medications prior to visit.     Per HPI unless specifically indicated in ROS section below Review of Systems  Constitutional:  Negative for fatigue and fever.  HENT:  Negative for ear pain.   Eyes:  Negative for pain.  Respiratory:  Negative for chest tightness and shortness of breath.   Cardiovascular:  Negative for chest pain, palpitations and leg swelling.  Gastrointestinal:  Negative for abdominal pain.  Genitourinary:  Negative for dysuria.   Objective:  BP 110/76   Pulse 67   Temp 98 F (36.7 C) (Temporal)   Ht 5' 7 (1.702 m)   Wt 141 lb 8 oz (64.2 kg)   LMP 05/19/2024   SpO2 98%   BMI 22.16 kg/m   Wt Readings from Last 3 Encounters:  05/31/24 141 lb 8 oz (64.2 kg)  05/30/24 144 lb (65.3 kg)  05/17/24 144 lb 4 oz (65.4 kg)      Physical Exam Constitutional:      General: She is not  in acute distress.    Appearance: Normal appearance. She is well-developed. She is not ill-appearing or toxic-appearing.  HENT:     Head: Normocephalic.     Right Ear: Hearing normal. Tympanic membrane is not erythematous, retracted or bulging.     Left Ear: Hearing normal. Tympanic membrane is not erythematous, retracted or bulging.     Nose: No mucosal edema or rhinorrhea.     Right Sinus: No maxillary sinus tenderness or frontal sinus tenderness.     Left Sinus: No maxillary sinus tenderness or frontal sinus tenderness.     Mouth/Throat:     Pharynx: Uvula midline.  Eyes:     General: Lids are normal. Lids are everted, no foreign bodies appreciated.     Conjunctiva/sclera: Conjunctivae normal.     Pupils: Pupils are equal, round, and reactive to light.  Neck:     Thyroid : No thyroid  mass or thyromegaly.     Trachea: Trachea normal.  Cardiovascular:     Heart  sounds: S1 normal and S2 normal.  Pulmonary:     Effort: Pulmonary effort is normal. No tachypnea.     Breath sounds: No decreased breath sounds.  Abdominal:     General: Bowel sounds are normal.     Palpations: Abdomen is soft.     Tenderness: There is no abdominal tenderness.  Musculoskeletal:     Cervical back: Normal range of motion and neck supple.  Skin:    General: Skin is warm and dry.     Findings: No rash.  Neurological:     Mental Status: She is alert.  Psychiatric:        Mood and Affect: Mood is not anxious or depressed.        Speech: Speech normal.        Behavior: Behavior normal. Behavior is cooperative.        Thought Content: Thought content normal.        Judgment: Judgment normal.       Results for orders placed or performed in visit on 04/27/24  WET PREP BY MOLECULAR PROBE   Collection Time: 04/27/24  9:22 AM   Specimen: Vaginal Swab  Result Value Ref Range   MICRO NUMBER: [id number]    SPECIMEN QUALITY: Adequate    SOURCE: VAGINA    STATUS: FINAL    Trichomonas vaginosis Not Detected    Gardnerella vaginalis Not Detected    Candida species Not Detected   HIV Antibody (routine testing w rflx)   Collection Time: 04/27/24  9:23 AM  Result Value Ref Range   HIV FINAL INTERPRETATION     HIV 1&2 Ab, 4th Generation NON-REACTIVE NON-REACTIVE  RPR   Collection Time: 04/27/24  9:23 AM  Result Value Ref Range   RPR Ser Ql NON-REACTIVE NON-REACTIVE  Hepatitis panel, acute   Collection Time: 04/27/24  9:23 AM  Result Value Ref Range   Hep A IgM NON-REACTIVE NON-REACTIVE   Hepatitis B Surface Ag NON-REACTIVE NON-REACTIVE   Hep B C IgM NON-REACTIVE NON-REACTIVE   Hepatitis C Ab NON-REACTIVE NON-REACTIVE  HSV(herpes simplex vrs) 1+2 ab-IgG   Collection Time: 04/27/24  9:23 AM  Result Value Ref Range   HSV 1 IGG,TYPE SPECIFIC AB 42.00 (H) index   HSV 2 IGG,TYPE SPECIFIC AB <0.90 index  C. trachomatis/N. gonorrhoeae RNA   Collection Time: 04/27/24  9:26 AM    Specimen: Vaginal Swab  Result Value Ref Range   C. trachomatis RNA, TMA NOT DETECTED NOT DETECTED   N.  gonorrhoeae RNA, TMA NOT DETECTED NOT DETECTED    Assessment and Plan  Vaginal discharge Assessment & Plan: Acute, continuous for the last month despite negative testing at the end of August.  That test may have been invalidated by using boric acid prior to wet prep. Repeat wet prep today.  Return precautions provided.  Orders: -     WET PREP BY MOLECULAR PROBE    No follow-ups on file.   Greig Ring, MD

## 2024-05-31 NOTE — Assessment & Plan Note (Signed)
 Acute, continuous for the last month despite negative testing at the end of August.  That test may have been invalidated by using boric acid prior to wet prep. Repeat wet prep today.  Return precautions provided.

## 2024-06-01 ENCOUNTER — Telehealth: Payer: Self-pay | Admitting: Gastroenterology

## 2024-06-01 ENCOUNTER — Ambulatory Visit: Payer: Self-pay | Admitting: Family Medicine

## 2024-06-01 DIAGNOSIS — J029 Acute pharyngitis, unspecified: Secondary | ICD-10-CM | POA: Diagnosis not present

## 2024-06-01 LAB — WET PREP BY MOLECULAR PROBE
Candida species: NOT DETECTED
MICRO NUMBER:: 17036070
SPECIMEN QUALITY:: ADEQUATE
Trichomonas vaginosis: NOT DETECTED

## 2024-06-01 MED ORDER — METRONIDAZOLE 500 MG PO TABS: 500.0000 mg | ORAL_TABLET | Freq: Two times a day (BID) | ORAL | 0 refills | Status: AC

## 2024-06-01 MED ORDER — FLUCONAZOLE 150 MG PO TABS
150.0000 mg | ORAL_TABLET | Freq: Once | ORAL | 0 refills | Status: DC
Start: 2024-06-01 — End: 2024-06-16

## 2024-06-01 NOTE — Telephone Encounter (Signed)
 I have spoken to patient who states that yesterday morning, she woke up with slight sore throat, cough and phlegm. States that sore throat worsened throughout the day and by night/early this morning, she has had increasing cough and phlegm production with slight SOB if trying to talk. Denies any fever. States she called the on call doctor x 2 last night but never heard anything back.  I have advised patient that her symptoms could be in relation to a viral infection that she coincidently picked up around the time of her endoscopy. However with her current symptoms and proximity to timing of endoscopy, we also cannot rule out developing aspiration pneumonia or other process. I have recommended patient go to an urgent care for evaluation of her symptoms and imaging if they find appropriate. She verbalizes understanding and states she will do this.

## 2024-06-01 NOTE — Telephone Encounter (Signed)
 Thanks Dottie - agree with your recommendations. There was no obvious aspiration during endoscopy which was uneventful, although I guess possible I agree she could otherwise have a viral / infectious process and should seek further evaluation

## 2024-06-01 NOTE — Telephone Encounter (Signed)
 Inbound call from patient stating she had an upper endoscopy on 05/30/24 and has been experiencing a sore throat, shortness of breath and coughing up mucus.  Patient would like to speak to nurse  Please advise  Thank you

## 2024-06-02 LAB — SURGICAL PATHOLOGY

## 2024-06-05 ENCOUNTER — Ambulatory Visit: Payer: Self-pay | Admitting: Gastroenterology

## 2024-06-16 MED ORDER — FLUCONAZOLE 150 MG PO TABS: 150.0000 mg | ORAL_TABLET | Freq: Once | ORAL | 0 refills | Status: AC

## 2024-06-16 NOTE — Addendum Note (Signed)
 Addended by: AVELINA NO E on: 06/16/2024 05:51 PM   Modules accepted: Orders

## 2024-06-20 DIAGNOSIS — F900 Attention-deficit hyperactivity disorder, predominantly inattentive type: Secondary | ICD-10-CM | POA: Diagnosis not present

## 2024-06-22 ENCOUNTER — Other Ambulatory Visit: Payer: Self-pay | Admitting: Medical Genetics

## 2024-06-22 DIAGNOSIS — Z006 Encounter for examination for normal comparison and control in clinical research program: Secondary | ICD-10-CM

## 2024-06-28 DIAGNOSIS — F900 Attention-deficit hyperactivity disorder, predominantly inattentive type: Secondary | ICD-10-CM | POA: Diagnosis not present

## 2024-07-13 DIAGNOSIS — F411 Generalized anxiety disorder: Secondary | ICD-10-CM | POA: Diagnosis not present

## 2024-07-13 DIAGNOSIS — F900 Attention-deficit hyperactivity disorder, predominantly inattentive type: Secondary | ICD-10-CM | POA: Diagnosis not present

## 2024-07-15 ENCOUNTER — Other Ambulatory Visit: Payer: Self-pay | Admitting: Primary Care

## 2024-07-15 DIAGNOSIS — N390 Urinary tract infection, site not specified: Secondary | ICD-10-CM

## 2024-07-15 MED ORDER — NITROFURANTOIN MACROCRYSTAL 50 MG PO CAPS: ORAL_CAPSULE | ORAL | 0 refills | Status: AC

## 2024-07-15 NOTE — Telephone Encounter (Unsigned)
 Copied from CRM #[id number]. Topic: Clinical - Medication Refill >> Jul 15, 2024 10:35 AM Suzen RAMAN wrote: Medication: nitrofurantoin  (MACRODANTIN ) 50 MG capsule   Has the patient contacted their pharmacy? Yes   This is the patient's preferred pharmacy:  [facility] [address] [place], [place], [place] [address] [phone]; Fax unknown   Is this the correct pharmacy for this prescription? Yes If no, delete pharmacy and type the correct one.   Has the prescription been filled recently? No  Is the patient out of the medication? No  Has the patient been seen for an appointment in the last year OR does the patient have an upcoming appointment? Yes  Can we respond through MyChart? Yes  Agent: Please be advised that Rx refills may take up to 3 business days. We ask that you follow-up with your pharmacy.

## 2024-07-19 DIAGNOSIS — F900 Attention-deficit hyperactivity disorder, predominantly inattentive type: Secondary | ICD-10-CM | POA: Diagnosis not present

## 2024-07-25 DIAGNOSIS — N898 Other specified noninflammatory disorders of vagina: Secondary | ICD-10-CM | POA: Diagnosis not present

## 2024-07-25 DIAGNOSIS — Z6821 Body mass index (BMI) 21.0-21.9, adult: Secondary | ICD-10-CM | POA: Diagnosis not present

## 2024-08-04 DIAGNOSIS — F900 Attention-deficit hyperactivity disorder, predominantly inattentive type: Secondary | ICD-10-CM | POA: Diagnosis not present

## 2024-08-04 DIAGNOSIS — F411 Generalized anxiety disorder: Secondary | ICD-10-CM | POA: Diagnosis not present

## 2024-08-09 DIAGNOSIS — F900 Attention-deficit hyperactivity disorder, predominantly inattentive type: Secondary | ICD-10-CM | POA: Diagnosis not present

## 2024-08-09 DIAGNOSIS — F411 Generalized anxiety disorder: Secondary | ICD-10-CM | POA: Diagnosis not present

## 2024-08-16 DIAGNOSIS — F411 Generalized anxiety disorder: Secondary | ICD-10-CM | POA: Diagnosis not present

## 2024-08-16 DIAGNOSIS — F900 Attention-deficit hyperactivity disorder, predominantly inattentive type: Secondary | ICD-10-CM | POA: Diagnosis not present

## 2024-08-30 DIAGNOSIS — F411 Generalized anxiety disorder: Secondary | ICD-10-CM | POA: Diagnosis not present

## 2024-08-30 DIAGNOSIS — F900 Attention-deficit hyperactivity disorder, predominantly inattentive type: Secondary | ICD-10-CM | POA: Diagnosis not present
